# Patient Record
Sex: Female | Born: 1945 | Race: White | Hispanic: No | Marital: Married | Smoking: Former smoker
Health system: Southern US, Community
[De-identification: ages and names within clinical notes are randomized; demographics above are authoritative.]

## PROBLEM LIST (undated history)

## (undated) DIAGNOSIS — G709 Myoneural disorder, unspecified: Secondary | ICD-10-CM

## (undated) DIAGNOSIS — M47816 Spondylosis without myelopathy or radiculopathy, lumbar region: Secondary | ICD-10-CM

## (undated) DIAGNOSIS — F32A Depression, unspecified: Secondary | ICD-10-CM

## (undated) DIAGNOSIS — E785 Hyperlipidemia, unspecified: Secondary | ICD-10-CM

## (undated) DIAGNOSIS — Z87442 Personal history of urinary calculi: Secondary | ICD-10-CM

## (undated) DIAGNOSIS — N289 Disorder of kidney and ureter, unspecified: Secondary | ICD-10-CM

## (undated) DIAGNOSIS — F329 Major depressive disorder, single episode, unspecified: Secondary | ICD-10-CM

## (undated) DIAGNOSIS — E119 Type 2 diabetes mellitus without complications: Secondary | ICD-10-CM

## (undated) DIAGNOSIS — C801 Malignant (primary) neoplasm, unspecified: Secondary | ICD-10-CM

## (undated) DIAGNOSIS — I10 Essential (primary) hypertension: Secondary | ICD-10-CM

## (undated) DIAGNOSIS — M25551 Pain in right hip: Secondary | ICD-10-CM

## (undated) DIAGNOSIS — K760 Fatty (change of) liver, not elsewhere classified: Secondary | ICD-10-CM

## (undated) DIAGNOSIS — G4730 Sleep apnea, unspecified: Secondary | ICD-10-CM

## (undated) DIAGNOSIS — M1810 Unilateral primary osteoarthritis of first carpometacarpal joint, unspecified hand: Secondary | ICD-10-CM

## (undated) DIAGNOSIS — E669 Obesity, unspecified: Secondary | ICD-10-CM

## (undated) DIAGNOSIS — F419 Anxiety disorder, unspecified: Secondary | ICD-10-CM

## (undated) DIAGNOSIS — Q211 Atrial septal defect, unspecified: Secondary | ICD-10-CM

## (undated) DIAGNOSIS — J189 Pneumonia, unspecified organism: Secondary | ICD-10-CM

## (undated) DIAGNOSIS — T7840XA Allergy, unspecified, initial encounter: Secondary | ICD-10-CM

## (undated) DIAGNOSIS — R7303 Prediabetes: Secondary | ICD-10-CM

## (undated) HISTORY — PX: CARPAL TUNNEL RELEASE: SHX101

## (undated) HISTORY — PX: TONSILLECTOMY: SUR1361

## (undated) HISTORY — DX: Myoneural disorder, unspecified: G70.9

## (undated) HISTORY — PX: OTHER SURGICAL HISTORY: SHX169

## (undated) HISTORY — DX: Atrial septal defect: Q21.1

## (undated) HISTORY — PX: CATARACT EXTRACTION: SUR2

## (undated) HISTORY — PX: BREAST REDUCTION SURGERY: SHX8

## (undated) HISTORY — DX: Obesity, unspecified: E66.9

## (undated) HISTORY — DX: Allergy, unspecified, initial encounter: T78.40XA

## (undated) HISTORY — DX: Essential (primary) hypertension: I10

## (undated) HISTORY — DX: Anxiety disorder, unspecified: F41.9

## (undated) HISTORY — DX: Atrial septal defect, unspecified: Q21.10

## (undated) HISTORY — DX: Hyperlipidemia, unspecified: E78.5

## (undated) HISTORY — DX: Prediabetes: R73.03

## (undated) HISTORY — DX: Major depressive disorder, single episode, unspecified: F32.9

## (undated) HISTORY — DX: Depression, unspecified: F32.A

## (undated) HISTORY — DX: Unilateral primary osteoarthritis of first carpometacarpal joint, unspecified hand: M18.10

## (undated) HISTORY — DX: Spondylosis without myelopathy or radiculopathy, lumbar region: M47.816

---

## 1991-05-14 HISTORY — PX: BUNIONECTOMY: SHX129

## 2001-05-13 HISTORY — PX: REDUCTION MAMMAPLASTY: SUR839

## 2004-03-21 ENCOUNTER — Encounter: Payer: Self-pay | Admitting: Internal Medicine

## 2006-05-13 HISTORY — PX: BREAST SURGERY: SHX581

## 2009-10-24 ENCOUNTER — Encounter: Payer: Self-pay | Admitting: Cardiology

## 2010-03-06 ENCOUNTER — Telehealth (INDEPENDENT_AMBULATORY_CARE_PROVIDER_SITE_OTHER): Payer: Self-pay | Admitting: *Deleted

## 2010-04-23 ENCOUNTER — Ambulatory Visit: Payer: Self-pay | Admitting: Internal Medicine

## 2010-04-23 ENCOUNTER — Encounter: Payer: Self-pay | Admitting: Internal Medicine

## 2010-04-23 DIAGNOSIS — Z8679 Personal history of other diseases of the circulatory system: Secondary | ICD-10-CM

## 2010-04-23 DIAGNOSIS — I10 Essential (primary) hypertension: Secondary | ICD-10-CM

## 2010-05-04 ENCOUNTER — Encounter: Payer: Self-pay | Admitting: Internal Medicine

## 2010-05-04 ENCOUNTER — Ambulatory Visit: Payer: Self-pay

## 2010-05-04 ENCOUNTER — Ambulatory Visit (HOSPITAL_COMMUNITY)
Admission: RE | Admit: 2010-05-04 | Discharge: 2010-05-04 | Payer: Self-pay | Source: Home / Self Care | Attending: Internal Medicine | Admitting: Internal Medicine

## 2010-05-21 ENCOUNTER — Telehealth: Payer: Self-pay | Admitting: Internal Medicine

## 2010-05-24 ENCOUNTER — Other Ambulatory Visit: Payer: Self-pay | Admitting: Internal Medicine

## 2010-05-24 ENCOUNTER — Ambulatory Visit
Admission: RE | Admit: 2010-05-24 | Discharge: 2010-05-24 | Payer: Self-pay | Source: Home / Self Care | Attending: Internal Medicine | Admitting: Internal Medicine

## 2010-05-25 LAB — BASIC METABOLIC PANEL
BUN: 17 mg/dL (ref 6–23)
CO2: 28 mEq/L (ref 19–32)
Calcium: 8.9 mg/dL (ref 8.4–10.5)
Chloride: 106 mEq/L (ref 96–112)
Creatinine, Ser: 0.8 mg/dL (ref 0.4–1.2)
GFR: 75.46 mL/min (ref >60.00)
Glucose, Bld: 96 mg/dL (ref 70–99)
Potassium: 4 mEq/L (ref 3.5–5.1)
Sodium: 142 mEq/L (ref 135–145)

## 2010-05-29 ENCOUNTER — Encounter: Payer: Self-pay | Admitting: Internal Medicine

## 2010-06-12 NOTE — Letter (Signed)
Summary: External Correspondence/ [facility]  External Correspondence/ [facility]   Imported By: Dorise Hiss 10/31/2009 11:18:10  _____________________________________________________________________  External Attachment:    Type:   Image     Comment:   External Document

## 2010-06-12 NOTE — Progress Notes (Signed)
  ROI faxed to [facility] 10/21,records received back 10/25, gave to [facility]  March 06, 2010 9:36 AM

## 2010-06-14 LAB — CONVERTED CEMR LAB
BUN: 19 mg/dL (ref 6–23)
CO2: 27 meq/L (ref 19–32)
Calcium: 9.4 mg/dL (ref 8.4–10.5)
Chloride: 105 meq/L (ref 96–112)
Creatinine, Ser: 0.7 mg/dL (ref 0.4–1.2)
GFR calc non Af Amer: 92.37 mL/min (ref >60.00)
Glucose, Bld: 105 mg/dL — ABNORMAL HIGH (ref 70–99)
Potassium: 4.1 meq/L (ref 3.5–5.1)
Sodium: 139 meq/L (ref 135–145)

## 2010-06-14 NOTE — Letter (Addendum)
Summary: [facility]  [facility] 1999-2005   Imported By: Marylou Mccoy 05/10/2010 16:47:37  _____________________________________________________________________  External Attachment:    Type:   Image     Comment:   External Document

## 2010-06-14 NOTE — Assessment & Plan Note (Addendum)
Summary: change in medication HCTZ  Prescriptions: HYDROCHLOROTHIAZIDE 12.5 MG CAPS (HYDROCHLOROTHIAZIDE) one by mouth daily  #30 x 11   Entered by:   Dennis Bast, RN, BSN   Authorized by:   Hillis Range, MD   Signed by:   Dennis Bast, RN, BSN on 05/29/2010   Method used:   Faxed to ...       Hospital doctor (retail)       [address]       [place], [place]  [address]       Ph: [phone] or [phone]       Fax: [phone]   RxID:   [id number] HYDROCHLOROTHIAZIDE 12.5 MG CAPS (HYDROCHLOROTHIAZIDE) one by mouth daily  #30 x 11   Entered by:   Dennis Bast, RN, BSN   Authorized by:   Hillis Range, MD   Signed by:   Dennis Bast, RN, BSN on 05/29/2010   Method used:   Print then Give to Patient   RxID:   [id number]

## 2010-06-14 NOTE — Assessment & Plan Note (Addendum)
Summary: moving back to area/hx of atrial septal defect/saf   Visit Type:  Initial Consult Referring Provider:  Loleta Chance, MD [facility]) Primary Provider:  Cecille Amsterdam (Dr Deitra Mayo' office)   History of Present Illness: Julie Owen is a pleasnat 65 yo WF with a h/o ASD who presents today for cardiology self referral.  She was previously followed by DR McGorisk at [facility].  She states that she was followed with echo's each year and was planned to have ASD repair 2006.  She did not have the procedure due to concerns of her dog being sick and not wanting to stay in the hospital.  She was initiated on ASA but has not tolerated this medicine due to GI intolerance.  She has not had cardiology follow-up since that time. 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  She reports that she will likely need a hip replacement.  She denies exertional chest pain, SOB at rest, orthopnea, PND, edema,  presyncope, or syncope. She reports prior heart "fluttering" but none recently.   She reports stable dyspnea with moderate activity which she attributes to sedentary lifestyle and obesity.  Current Medications (verified): 1)  Simvastatin 40 Mg Tabs (Simvastatin) .... Take One Tablet By Mouth Daily At Bedtime 2)  Cymbalta 30 Mg Cpep (Duloxetine Hcl) .... Two Times A Day 3)  Noritate 1 % Crea (Metronidazole) .... Uad  Allergies (verified): No Known Drug Allergies  Past History:  Past Medical History: ATRIAL SEPTAL DEFECT diagnosed 1980 Obesity R hip DJD HL Depression/ Anxiety Rosecia  denies h/o rheumatic fever    Past Surgical History: tonsellectomy toe surgery breast surgery 2008 to remove "atypical cells" with subsequent breast reduction surgery  Family History: mother died at age 86 of MI, first MI age 76  Social History: Pt lives in [place].  Moved here from [place] last week.  Retired Runner, broadcasting/film/video quit smoking 1980 drinks 1 glass of wine daily  Review of Systems   All systems are reviewed and negative except as listed in the HPI.   + occasional headaches  Vital Signs:  Patient profile:   65 year old female Height:      65 inches Weight:      204 pounds BMI:     34.07 Pulse rate:   82 / minute BP sitting:   148 / 80  Vitals Entered By: Julie Owen CMA (April 23, 2010 2:39 PM)  Physical Exam  General:  overweight, NAD Head:  normocephalic and atraumatic Eyes:  PERRLA/EOM intact; conjunctiva and lids normal. Mouth:  Teeth, gums and palate normal. Oral mucosa normal. Neck:  Neck supple, no JVD. No masses, thyromegaly or abnormal cervical nodes. Lungs:  Clear bilaterally to auscultation and percussion. Heart:  RRR, no m/r/g  loud P2 Abdomen:  Bowel sounds positive; abdomen soft and non-tender without masses, organomegaly, or hernias noted. No hepatosplenomegaly. Msk:  Back normal, normal gait. Muscle strength and tone normal. Extremities:  No clubbing or cyanosis. Neurologic:  Alert and oriented x 3. Skin:  Intact without lesions or rashes. Cervical Nodes:  no significant adenopathy Psych:  Normal affect.   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  There was felt to be enough of a rim below the ASD to allow closure.  TE Echocardiogram  Procedure date:  08/30/2003  Findings:      LVEF 55-60%, LVEDD 43, LA 42, RV pressure 21-26, diastolic dysfunction filling pattern,   Impression & Recommendations:  Problem # 1:  ATRIAL SEPTAL DEFECT, HX OF (ICD-V12.59) The patient presents today for follow-up of her previously documented ASD.  She had R to L shunt by doppler on TEE in 2005.  She was previously offered closure but declined.  Presently, she  appears to be doing well with minimal symptoms. We will repeat her TTE with bubble to evaluate her ASD and then decide whether or not referral to Dr Excell Seltzer for closure is indicated. She has not tolerated ASA previously.  Problem # 2:  ESSENTIAL HYPERTENSION, BENIGN (ICD-401.1) above goal add Lisinopril 5mg  daily today. check BMET  Other Orders: TLB-BMP (Basic Metabolic Panel-BMET) (80048-METABOL) Echocardiogram (Echo)  Patient Instructions: 1)  Your physician recommends that you return for lab work today 2)  Your physician has recommended you make the following change in your medication: start Lisinopril 5mg  daily 3)  Your physician wants you to follow-up in: 12 months with Dr Jacquiline Doe will receive a reminder letter in the mail two months in advance. If you don't receive a letter, please call our office to schedule the follow-up appointment. 4)  Your physician has requested that you have an echocardiogram.  Echocardiography is a painless test that uses sound waves to create images of your heart. It provides your doctor with information about the size and shape of your heart and how well your heart's chambers and valves are working.  This procedure takes approximately one hour. There are no restrictions for this procedure. Prescriptions: LISINOPRIL 5 MG TABS (LISINOPRIL) one by mouth  #30 x 11   Entered by:   Dennis Bast, RN, BSN   Authorized by:   Hillis Range, MD   Signed by:   Dennis Bast, RN, BSN on 04/23/2010   Method used:   Faxed to ...       Hospital doctor (retail)       [address]       [place], [place]  [address]       Ph: [phone] or [phone]       Fax: [phone]   RxID:   [id number]

## 2010-06-14 NOTE — Progress Notes (Addendum)
Summary: pt needs to talk about side effects of medication  Phone Note Call from Patient Call back at Home Phone [phone]   Caller: Patient Reason for Call: Talk to Nurse, Talk to Doctor Summary of Call: pt needs to talk to you about side effects from her b/p meds  Initial call taken by: Omer Jack,  May 21, 2010 11:31 AM  Follow-up for Phone Call        discussed with DrAllred  Pt to d/c Lisinopril and and some by on Thurs for BMP at 3pm  Will keep track of her BP's and bring them in with her when she comes for labs Dennis Bast, RN, BSN  May 21, 2010 3:13 PM

## 2010-06-18 ENCOUNTER — Telehealth: Payer: Self-pay | Admitting: Internal Medicine

## 2010-06-22 ENCOUNTER — Telehealth (INDEPENDENT_AMBULATORY_CARE_PROVIDER_SITE_OTHER): Payer: Self-pay | Admitting: *Deleted

## 2010-06-28 NOTE — Progress Notes (Signed)
Summary: pt has medication question  Phone Note Call from Patient Call back at [phone]   Caller: Patient Reason for Call: Talk to Nurse, Talk to Doctor Summary of Call: pt has a question regarding the b/p medication she was put on called lopressor Initial call taken by: Omer Jack,  June 18, 2010 11:57 AM  Follow-up for Phone Call        Pt. states was take Lisinopril 5 mg once daily. She was having adverse reaction from medication. Lisinopril was D/C and she was placed on HCTZ 12.5 mg once a day on Jan. 17th/2012. Pt. states this medication is not getting her B/P down. Pt's average B/P is 164/112 to 145/96 in 17 days Follow-up by: Ollen Gross, RN, BSN,  June 18, 2010 1:49 PM  Additional Follow-up for Phone Call Additional follow up Details #1::        164/113--highest   avg  145/93 for 17 days What should she do?  I informed her I would discuss with Dr Johney Frame and call her back tomorrow Dennis Bast, RN, BSN  June 19, 2010 5:35 PM put in correct chatLanier, RN, BSN  June 22, 2010 4:10 PM

## 2010-06-29 ENCOUNTER — Other Ambulatory Visit: Payer: Self-pay

## 2010-07-04 NOTE — Progress Notes (Signed)
  Phone Note Call from Patient   Caller: Patient Call For: nurse Summary of Call: Phone Note  Call from Patient Call back at [phone]   Caller: Patient Reason for Call: Talk to Nurse, Talk to Doctor Summary of Call: pt has a question regarding the b/p medication she was put on called lopressor Initial call taken by: Omer Jack,  June 18, 2010 11:57 AM  Follow-up for Phone Call         Pt. states was take Lisinopril 5 mg once daily. She was having adverse reaction from medication. Lisinopril was D/C and she was placed on HCTZ 12.5 mg once a day on Jan. 17th/2012. Pt. states this medication is not getting her B/P down. Pt's average B/P is 164/112 to 145/96 in 17 days Follow-up by: Ollen Gross, RN, BSN,  June 18, 2010 1:49 PM  Additional Follow-up for Phone Call  Additional follow up Details #1::        164/113--highest   avg  145/93 for 17 days What should she do?  I informed her I would discuss with Dr Johney Frame and call her back tomorrow Dennis Bast, RN, BSN  June 19, 2010 5:35 PM   Follow-up for Phone Call        increase hctz to 25mg  daily  Follow-up by: Hillis Range, MD,  June 25, 2010 1:58 PM  Additional Follow-up for Phone Call Additional follow up Details #1::        called pt and spoke with husband.  Call her on her cell # [phone].  Called and spoke with patient informed her to increase HCTZ 25mg  daily Talked with her primary MD and he wanted her to increase her HCTZ 25mg  She did this and says her BP was unchanged  So now she has an appointment  with them tomorrow and she is going to see what they say.   Dennis Bast, RN, BSN  June 26, 2010 5:05 PM

## 2010-08-12 HISTORY — PX: TOTAL HIP ARTHROPLASTY: SHX124

## 2010-08-15 NOTE — H&P (Signed)
NAMEELZABETH, MCQUERRY NO.:  [id number]  MEDICAL RECORD NO.:  [id number]          PATIENT TYPE:  INP  LOCATION:                               FACILITY:  [facility]  PHYSICIAN:  Ollen Gross, M.D.    DATE OF BIRTH:  [birth year]  DATE OF ADMISSION:  08/24/2010 DATE OF DISCHARGE:                             HISTORY & PHYSICAL   CHIEF COMPLAINT:  Right hip pain.  HISTORY OF PRESENT ILLNESS:  The patient is a 65 year old female who has been seen by Dr. Lequita Halt for ongoing right hip pain.  She has known advancing arthritis, which has been progressive in nature.  She is felt she would benefit from undergoing surgical intervention.  Risks and benefits have been discussed.  She elected to proceed with surgery.  ALLERGIES:  Multiple allergies to include, 1. PERCOCET causes hives and spots. 2. CODEINE causes hives. 3. NEOSPORIN topically causes burning and itching and skin     sensitivity. 4. She has a positive skin test to NOVOCAINE, positive skin test to     MARCAINE.  INTOLERANCES:  ASPIRIN causes GI upset, pain, and diarrhea.  CURRENT MEDICATIONS: 1. Bystolic 5 mg every morning. 2. Cymbalta 30 mg twice a day. 3. Simvastatin 20 mg at night. 4. Vitamin C. 5. Aleve.  PAST MEDICAL HISTORY: 1. Impaired vision, she uses glasses. 2. Tinnitus. 3. Anxiety. 4. Depression. 5. Hypertension. 6. Atrial septal defect (no surgery). 7. Hypercholesterolemia. 8. Hemorrhoids. 9. Urinary incontinence. 10.Prediabetic condition. 11.Postmenopausal. 12.Childhood illnesses, measles, and mumps.  PAST SURGICAL HISTORY:  Bunionectomy, toe surgery, right breast surgery, breast reduction.  FAMILY HISTORY:  Father living, age 67, with glaucoma, macular degeneration.  Mother deceased at age 75, hypertension, heart disease with 2 heart attacks, hypercholesterolemia.  Sister with hypertension, colon cancer disease, she is living at age 51.  SOCIAL HISTORY:  Married.  Previous  Runner, broadcasting/film/video.  Past smoker.  Glass of red wine daily. Spouse will be assisting with care after the surgery with the patient.  She does have a caregiver lined up.  She has 3 steps entering her home.  REVIEW OF SYSTEMS:  GENERAL:  No fever, chills, occasional night sweats. NEURO:  A little bit of hearing loss and no seizures or syncope. RESPIRATORY:  No shortness of breath, productive cough, or hemoptysis. CARDIOVASCULAR:  No chest pain, angina, or orthopnea.  GI:  No nausea, vomiting, diarrhea, or constipation.  GU:  A little bit of incontinence, weak stream.  No dysuria, hematuria.  MUSCULOSKELETAL:  Right hip.  PHYSICAL EXAMINATION:  VITAL SIGNS:  Pulse 64, respirations 12, blood pressure 134/72. GENERAL:  A 65 year old white female, well nourished, well developed, in no acute distress.  She is alert, oriented, cooperative, very pleasant. Good historian. HEENT:  Normocephalic, atraumatic.  Pupils are round and reactive.  EOMs intact. NECK:  Supple. CHEST:  Clear. HEART:  Regular rate and rhythm without murmur. ABDOMEN:  Soft, slightly round.  Bowel sounds present. RECTAL/BREASTS/GENITALIA:  Not done, not pertinent to present illness. EXTREMITIES:  Right hip, flexion 90, 0 internal rotation, 20 degrees external rotation, 20 degrees abduction.  IMPRESSION:  Osteoarthritis, right  hip.  PLAN:  The patient admitted to [facility] to undergo a right total hip replacement arthroplasty.  Surgery will be performed by Dr. Ollen Gross.     Alexzandrew L. Julien Girt, P.A.C.   ______________________________ Ollen Gross, M.D.    ALP/MEDQ  D:  08/05/2010  T:  08/06/2010  Job:  [id number]  cc:   Barry Dienes. Eloise Harman, M.D. Fax: [phone]  Hillis Range, MD [address] [place] [address]  Electronically Signed by Patrica Duel P.A.C. on 08/15/2010 07:17:10 AM Electronically Signed by Ollen Gross M.D. on 08/15/2010 09:54:03 AM

## 2010-08-20 ENCOUNTER — Ambulatory Visit (HOSPITAL_COMMUNITY)
Admission: RE | Admit: 2010-08-20 | Discharge: 2010-08-20 | Disposition: A | Discharge status: Home / Self Care | Payer: Medicare Other | Source: Ambulatory Visit | Attending: Orthopedic Surgery | Admitting: Orthopedic Surgery

## 2010-08-20 ENCOUNTER — Encounter (HOSPITAL_COMMUNITY): Payer: Medicare Other

## 2010-08-20 ENCOUNTER — Other Ambulatory Visit (HOSPITAL_COMMUNITY): Payer: Self-pay | Admitting: Orthopedic Surgery

## 2010-08-20 ENCOUNTER — Telehealth: Payer: Self-pay | Admitting: Internal Medicine

## 2010-08-20 DIAGNOSIS — I10 Essential (primary) hypertension: Secondary | ICD-10-CM | POA: Insufficient documentation

## 2010-08-20 DIAGNOSIS — R059 Cough, unspecified: Secondary | ICD-10-CM | POA: Insufficient documentation

## 2010-08-20 DIAGNOSIS — M1610 Unilateral primary osteoarthritis, unspecified hip: Secondary | ICD-10-CM | POA: Insufficient documentation

## 2010-08-20 DIAGNOSIS — Z01812 Encounter for preprocedural laboratory examination: Secondary | ICD-10-CM | POA: Insufficient documentation

## 2010-08-20 DIAGNOSIS — Z01818 Encounter for other preprocedural examination: Secondary | ICD-10-CM

## 2010-08-20 DIAGNOSIS — R05 Cough: Secondary | ICD-10-CM | POA: Insufficient documentation

## 2010-08-20 DIAGNOSIS — M169 Osteoarthritis of hip, unspecified: Secondary | ICD-10-CM | POA: Insufficient documentation

## 2010-08-20 LAB — COMPREHENSIVE METABOLIC PANEL
ALT: 43 U/L — ABNORMAL HIGH (ref 0–35)
AST: 40 U/L — ABNORMAL HIGH (ref 0–37)
CO2: 30 mEq/L (ref 19–32)
Calcium: 9.6 mg/dL (ref 8.4–10.5)
Chloride: 105 mEq/L (ref 96–112)
Creatinine, Ser: 0.78 mg/dL (ref 0.4–1.2)
GFR calc Af Amer: >60 mL/min (ref >60)
GFR calc non Af Amer: >60 mL/min (ref >60)
Glucose, Bld: 126 mg/dL — ABNORMAL HIGH (ref 70–99)
Total Bilirubin: 0.5 mg/dL (ref 0.3–1.2)

## 2010-08-20 LAB — URINE MICROSCOPIC-ADD ON

## 2010-08-20 LAB — CBC
HCT: 45.6 % (ref 36.0–46.0)
Hemoglobin: 14.8 g/dL (ref 12.0–15.0)
RBC: 4.73 MIL/uL (ref 3.87–5.11)
WBC: 7.8 10*3/uL (ref 4.0–10.5)

## 2010-08-20 LAB — PROTIME-INR
INR: 0.92 (ref 0.00–1.49)
Prothrombin Time: 12.6 seconds (ref 11.6–15.2)

## 2010-08-20 LAB — URINALYSIS, ROUTINE W REFLEX MICROSCOPIC
Hgb urine dipstick: NEGATIVE
Nitrite: NEGATIVE
Protein, ur: NEGATIVE mg/dL
Specific Gravity, Urine: 1.024 (ref 1.005–1.030)
Urobilinogen, UA: 0.2 mg/dL (ref 0.0–1.0)

## 2010-08-20 LAB — APTT: aPTT: 28 seconds (ref 24–37)

## 2010-08-20 LAB — TYPE AND SCREEN: ABO/RH(D): A POS

## 2010-08-20 NOTE — Telephone Encounter (Signed)
12,Echo faxed over to [facility] @ [phone] 08/20/10/KM

## 2010-08-21 LAB — ABO/RH: ABO/RH(D): A POS

## 2010-08-24 ENCOUNTER — Inpatient Hospital Stay (HOSPITAL_COMMUNITY): Payer: Medicare Other

## 2010-08-24 ENCOUNTER — Inpatient Hospital Stay (HOSPITAL_COMMUNITY)
Admission: RE | Admit: 2010-08-24 | Discharge: 2010-08-28 | DRG: 470 | Disposition: A | Discharge status: Home Health | Payer: Medicare Other | Source: Ambulatory Visit | Attending: Orthopedic Surgery | Admitting: Orthopedic Surgery

## 2010-08-24 DIAGNOSIS — M1610 Unilateral primary osteoarthritis, unspecified hip: Principal | ICD-10-CM | POA: Diagnosis present

## 2010-08-24 DIAGNOSIS — I10 Essential (primary) hypertension: Secondary | ICD-10-CM | POA: Diagnosis present

## 2010-08-24 DIAGNOSIS — M169 Osteoarthritis of hip, unspecified: Principal | ICD-10-CM | POA: Diagnosis present

## 2010-08-24 DIAGNOSIS — R5082 Postprocedural fever: Secondary | ICD-10-CM | POA: Diagnosis not present

## 2010-08-24 DIAGNOSIS — E7800 Pure hypercholesterolemia, unspecified: Secondary | ICD-10-CM | POA: Diagnosis present

## 2010-08-24 DIAGNOSIS — F341 Dysthymic disorder: Secondary | ICD-10-CM | POA: Diagnosis present

## 2010-08-24 DIAGNOSIS — E669 Obesity, unspecified: Secondary | ICD-10-CM | POA: Diagnosis present

## 2010-08-24 LAB — GLUCOSE, CAPILLARY: Glucose-Capillary: 153 mg/dL — ABNORMAL HIGH (ref 70–99)

## 2010-08-25 ENCOUNTER — Inpatient Hospital Stay (HOSPITAL_COMMUNITY): Payer: Medicare Other

## 2010-08-25 LAB — BASIC METABOLIC PANEL
Chloride: 102 mEq/L (ref 96–112)
GFR calc Af Amer: >60 mL/min (ref >60)
GFR calc non Af Amer: >60 mL/min (ref >60)
Potassium: 4.3 mEq/L (ref 3.5–5.1)
Sodium: 136 mEq/L (ref 135–145)

## 2010-08-25 LAB — URINALYSIS, ROUTINE W REFLEX MICROSCOPIC
Ketones, ur: NEGATIVE mg/dL
Leukocytes, UA: NEGATIVE
Nitrite: NEGATIVE
Protein, ur: NEGATIVE mg/dL
Specific Gravity, Urine: 1.011 (ref 1.005–1.030)
Specific Gravity, Urine: 1.021 (ref 1.005–1.030)
Urobilinogen, UA: 0.2 mg/dL (ref 0.0–1.0)
pH: 5.5 (ref 5.0–8.0)

## 2010-08-25 LAB — CBC
Platelets: 167 10*3/uL (ref 150–400)
RBC: 3.82 MIL/uL — ABNORMAL LOW (ref 3.87–5.11)
RDW: 13.9 % (ref 11.5–15.5)
WBC: 9 10*3/uL (ref 4.0–10.5)

## 2010-08-25 LAB — URINE MICROSCOPIC-ADD ON

## 2010-08-26 LAB — BASIC METABOLIC PANEL
CO2: 30 mEq/L (ref 19–32)
Calcium: 8.3 mg/dL — ABNORMAL LOW (ref 8.4–10.5)
Creatinine, Ser: 0.74 mg/dL (ref 0.4–1.2)
GFR calc Af Amer: >60 mL/min (ref >60)
Glucose, Bld: 158 mg/dL — ABNORMAL HIGH (ref 70–99)

## 2010-08-26 LAB — CBC
Hemoglobin: 11.2 g/dL — ABNORMAL LOW (ref 12.0–15.0)
MCH: 31.2 pg (ref 26.0–34.0)
MCHC: 32.7 g/dL (ref 30.0–36.0)
RDW: 13.5 % (ref 11.5–15.5)

## 2010-08-26 LAB — URINE CULTURE: Special Requests: NEGATIVE

## 2010-08-27 ENCOUNTER — Inpatient Hospital Stay (HOSPITAL_COMMUNITY): Payer: Medicare Other

## 2010-08-27 LAB — CBC
HCT: 37.5 % (ref 36.0–46.0)
Hemoglobin: 12.4 g/dL (ref 12.0–15.0)
WBC: 11.3 10*3/uL — ABNORMAL HIGH (ref 4.0–10.5)

## 2010-08-29 NOTE — Op Note (Signed)
[name], [name] NO.:  Owen  MEDICAL RECORD NO.:  Julie Owen           PATIENT TYPE:  I  LOCATION:  0005                         FACILITY:  [facility]  PHYSICIAN:  Julie Owen, M.D.    DATE OF BIRTH:  1945-07-18  DATE OF PROCEDURE: DATE OF DISCHARGE:                              OPERATIVE REPORT   PREOPERATIVE DIAGNOSIS:  Osteoarthritis, right hip.  POSTOPERATIVE DIAGNOSIS:  Osteoarthritis, right hip.  PROCEDURE:  Right total hip arthroplasty.  SURGEON:  Julie Owen, M.D.  ASSISTANT:  Alexzandrew L. Perkins, P.A.C.  ANESTHESIA:  General.  ESTIMATED BLOOD LOSS:  300.  DRAIN:  Hemovac x1.  COMPLICATIONS:  None.  CONDITION:  Stable to recovery room.  CLINICAL NOTE:  Julie Owen is a 65 year old female with advanced end- stage arthritis of the right hip with progressively worsening pain and dysfunction.  She has failed nonoperative management and presents for right total hip arthroplasty.PROCEDURE IN DETAIL:  After successful administration of general anesthetic, the patient was placed in left lateral decubitus position with the right side up and held with the hip positioner.  Right lower extremity was isolated from perineum with plastic drapes and prepped and draped in usual sterile fashion.  Short posterolateral incision was made with a #10 blade through subcutaneous tissue to the level of fascia lata which was incised in line with the skin incision.  Sciatic nerve was palpated and protected and short rotators and capsule isolated off the femur.  Hip was dislocated and the center of femoral head was marked.  A trial prosthesis was placed such that the center of the trial head corresponded to the center of the native femoral head.  Osteotomy line was marked on the femoral neck and osteotomy made with an oscillating saw.  Femoral head was removed and then retractors were placed to gain access to the proximal femur.  The canal was entered  with a canal finder.  We then thoroughly irrigated with saline to remove the fatty contents of the femoral canal.  Axial reaming was performed to 13.5 mm, proximal reaming to a 94F and the sleeve machined to an extra extra large.  An 63 F extra extra large trial sleeve was then placed.  The femur was retracted anteriorly to gain acetabular exposure. Acetabular retractors were placed and labrum and osteophytes removed. Acetabular reaming was performed up to 53 mm and a 54-mm pinnacle acetabular shell was placed in anatomic position and transfixed with 2 dome screws.  Apex hole eliminator was placed and then the 32 mm neutral +4 marathon liner was placed.  We then placed the trial femur which was 18 x 13, 36 +8 matching native anteversion.  32 +0 head was placed and it reduced easily, so went to +3 which had more appropriate soft tissue tension.  She has great stability with full extension, full external rotation, 70 degrees flexion, 40 degrees adduction, 90 degrees internal rotation, 90 degrees of flexion, and 70 degrees of internal rotation. By placing the right leg on top of the left, it felt as though the leg lengths were equal.  It was then dislocated  and trials removed.  The permanent 29 F extra extra large sleeve was placed with an 18 x 13 stem and a 36 +8 neck matching native anteversion.  The 32 +3 ceramic head was placed and the hip was reduced with the same stability parameters. The wound was copiously irrigated with saline solution and the short rotators and capsule reattached to the femur through drill holes with Ethibond suture.  Fascia lata was closed over Hemovac drain with interrupted #1 Vicryl, subcu closed with #1 and #2-0 Vicryl and subcuticular running 4-0 Monocryl.  Drain was hooked to suction. Incision cleaned and dried, and Steri-Strips and a bulky sterile dressing were applied.  She was then placed into a knee immobilizer, awakened and transported to recovery in  stable condition.     Julie Owen, M.D.     FA/MEDQ  D:  08/24/2010  T:  08/24/2010  Job:  [id number]  Electronically Signed by Julie Owen M.D. on 08/29/2010 09:53:07 AM

## 2011-06-25 ENCOUNTER — Encounter: Payer: Self-pay | Admitting: *Deleted

## 2011-06-25 ENCOUNTER — Encounter: Payer: Medicare Other | Attending: Internal Medicine | Admitting: *Deleted

## 2011-06-25 DIAGNOSIS — R7309 Other abnormal glucose: Secondary | ICD-10-CM | POA: Insufficient documentation

## 2011-06-25 DIAGNOSIS — E669 Obesity, unspecified: Secondary | ICD-10-CM | POA: Insufficient documentation

## 2011-06-25 DIAGNOSIS — Z713 Dietary counseling and surveillance: Secondary | ICD-10-CM | POA: Insufficient documentation

## 2011-06-25 NOTE — Patient Instructions (Addendum)
Goals:  Follow Pre-Diabetes Meal Plan as instructed (yellow card).  Eat 3 meals and 1-2 snacks, AVOID meal skipping!!  Add lean protein foods to ALL meals/snacks.  Aim for 20-30 mins of physical activity 2-3 days a week.  Gradually increase as able.   Follow up with insurance about Silver Sneakers program card and diabetes class coverage.   Limit sweets and high fat/fried foods.  **BILL, do not bring any more sweets (especially chocolate covered doughnuts) into the house until further notice!! :)

## 2011-06-25 NOTE — Progress Notes (Signed)
Medical Nutrition Therapy:  Appt start time: 1130 end time:  1230.  Assessment:  Hyperglycemia.  Pt with A1c of 6.4% here for assessment.  Reports recent FBG of 130 mg and 80 mg 1 hr after breakfast.  Consumes excessive CHO intake via simple, refined sugars and states sugar is a "big problem" at night. Spouse often brings high CHO foods (doughnuts, etc) into the house that she states she cannot resist.  Pt is exercising 3x/wk and reports no pain at this time.  MEDICATIONS: See medication list. Reviewed.   DIETARY INTAKE:  Usual eating pattern includes 2-3 meals and 1-2 snacks per day.  24-hr recall:  B ( AM): None Snk (11 AM): bowl of cereal, 1/2 banana, milk; orange Boiled egg and Atkins Shake  L ( PM): None Snk (2 PM): Avocado sandwich D (7 PM): Baked chicken thigh, salad w/ non-starchy veggies Snk ( PM): doughnut, lg piece of cake Beverages: Water  Usual physical activity: None  Estimated energy needs: 1300 calories 145-150 g carbohydrates 95-100 g protein 35-40 g fat  Progress Towards Goal(s):  In progress.   Nutritional Diagnosis:  [place]-2.2 Altered nutrition-related laboratory related to glucose metabolism as evidenced by recent A1c of 6.4% and MD referral for assessment.    Intervention/Goals:  Follow Pre-Diabetes Meal Plan as instructed (yellow card).  Eat 3 meals and 1-2 snacks, AVOID meal skipping!!  Add lean protein foods to ALL meals/snacks.  Aim for 20-30 mins of physical activity 2-3 days a week. Gradually increase as able.   Follow up with insurance about Silver Sneakers program card and diabetes class coverage.   Limit sweets and high fat/fried foods.  Handouts given during visit include:  Living Well with Diabetes - [employer]  Snack List/30 g & 45 g CHO menus  Monitoring/Evaluation:  Dietary intake, exercise, A1c and BG as available, and body weight in 4 week(s).

## 2011-06-26 ENCOUNTER — Encounter: Payer: Self-pay | Admitting: *Deleted

## 2011-08-22 ENCOUNTER — Other Ambulatory Visit (HOSPITAL_COMMUNITY): Payer: Self-pay | Admitting: Orthopedic Surgery

## 2011-08-22 DIAGNOSIS — M25551 Pain in right hip: Secondary | ICD-10-CM

## 2011-08-30 ENCOUNTER — Other Ambulatory Visit (HOSPITAL_COMMUNITY): Payer: Medicare Other

## 2011-08-30 ENCOUNTER — Encounter (HOSPITAL_COMMUNITY): Payer: Medicare Other

## 2011-09-02 ENCOUNTER — Encounter (HOSPITAL_COMMUNITY)
Admission: RE | Admit: 2011-09-02 | Discharge: 2011-09-02 | Disposition: A | Discharge status: Home / Self Care | Payer: Medicare Other | Source: Ambulatory Visit | Attending: Orthopedic Surgery | Admitting: Orthopedic Surgery

## 2011-09-02 ENCOUNTER — Encounter (HOSPITAL_COMMUNITY): Payer: Self-pay

## 2011-09-02 DIAGNOSIS — M25559 Pain in unspecified hip: Secondary | ICD-10-CM | POA: Insufficient documentation

## 2011-09-02 DIAGNOSIS — M25551 Pain in right hip: Secondary | ICD-10-CM

## 2011-09-02 DIAGNOSIS — Z96649 Presence of unspecified artificial hip joint: Secondary | ICD-10-CM | POA: Insufficient documentation

## 2011-09-02 HISTORY — DX: Pain in right hip: M25.551

## 2011-09-02 MED ORDER — TECHNETIUM TC 99M MEDRONATE IV KIT
23.8000 | PACK | Freq: Once | INTRAVENOUS | Status: AC | PRN
  Administered 2011-09-02: 23.8 via INTRAVENOUS

## 2011-10-28 ENCOUNTER — Emergency Department (HOSPITAL_COMMUNITY)
Admission: EM | Admit: 2011-10-28 | Discharge: 2011-10-28 | Disposition: A | Discharge status: Home / Self Care | Payer: Medicare Other | Source: Home / Self Care

## 2011-10-28 ENCOUNTER — Encounter (HOSPITAL_COMMUNITY): Payer: Self-pay

## 2011-10-28 DIAGNOSIS — N390 Urinary tract infection, site not specified: Secondary | ICD-10-CM

## 2011-10-28 LAB — POCT URINALYSIS DIP (DEVICE)
Bilirubin Urine: NEGATIVE
Glucose, UA: NEGATIVE mg/dL
Ketones, ur: NEGATIVE mg/dL
Specific Gravity, Urine: 1.025 (ref 1.005–1.030)

## 2011-10-28 MED ORDER — CEPHALEXIN 500 MG PO CAPS: 500.0000 mg | ORAL_CAPSULE | Freq: Three times a day (TID) | ORAL | Status: AC

## 2011-10-28 NOTE — ED Notes (Signed)
Has reportedly been treated by another [facility] in town x 2 for a UTI in past 6 months; treated w Cipro  1 st occurrence, and w macrodantin 2nd time w no relief. Had been told she needed to see her GYN, but was referred back to the [facility] since they started her tx, has changed insurance, and the new one is not accepted by the other [facility]. C?o frequency, urgency, sense of incomplete emptying, burning w urination

## 2011-10-28 NOTE — Discharge Instructions (Signed)
Thank you for coming in today.  I think it is likely you have a UTI.  We will do a urine culture to be sure we pick the right antibotic.  Take the keflex three times a day for 10 day.  You should start to feel better soon.   Urinary Tract Infection Infections of the urinary tract can start in several places. A bladder infection (cystitis), a kidney infection (pyelonephritis), and a prostate infection (prostatitis) are different types of urinary tract infections (UTIs). They usually get better if treated with medicines (antibiotics) that kill germs. Take all the medicine until it is gone. You or your child may feel better in a few days, but TAKE ALL MEDICINE or the infection may not respond and may become more difficult to treat. HOME CARE INSTRUCTIONS   Drink enough water and fluids to keep the urine clear or pale yellow. Cranberry juice is especially recommended, in addition to large amounts of water.   Avoid caffeine, tea, and carbonated beverages. They tend to irritate the bladder.   Alcohol may irritate the prostate.   Only take over-the-counter or prescription medicines for pain, discomfort, or fever as directed by your caregiver.  To prevent further infections:  Empty the bladder often. Avoid holding urine for long periods of time.   After a bowel movement, women should cleanse from front to back. Use each tissue only once.   Empty the bladder before and after sexual intercourse.  FINDING OUT THE RESULTS OF YOUR TEST Not all test results are available during your visit. If your or your child's test results are not back during the visit, make an appointment with your caregiver to find out the results. Do not assume everything is normal if you have not heard from your caregiver or the medical facility. It is important for you to follow up on all test results. SEEK MEDICAL CARE IF:   There is back pain.   Your baby is older than 3 months with a rectal temperature of 100.5 F (38.1  C) or higher for more than 1 day.   Your or your child's problems (symptoms) are no better in 3 days. Return sooner if you or your child is getting worse.  SEEK IMMEDIATE MEDICAL CARE IF:   There is severe back pain or lower abdominal pain.   You or your child develops chills.   You have a fever.   Your baby is older than 3 months with a rectal temperature of 102 F (38.9 C) or higher.   Your baby is 3 months old or younger with a rectal temperature of 100.4 F (38 C) or higher.   There is nausea or vomiting.   There is continued burning or discomfort with urination.  MAKE SURE YOU:   Understand these instructions.   Will watch your condition.   Will get help right away if you are not doing well or get worse.  Document Released: 02/06/2005 Document Revised: 04/18/2011 Document Reviewed: 09/11/2006 [facility] Patient Information 2012 [place], [place].

## 2011-10-28 NOTE — ED Provider Notes (Signed)
Medical screening examination/treatment/procedure(s) were performed by a resident physician and as supervising physician I was immediately available for consultation/collaboration.  Leslee Home, M.D.   Reuben Likes, MD 10/28/11 2102

## 2011-10-28 NOTE — ED Provider Notes (Signed)
Julie Owen is a 66 y.o. female who presents to Urgent Care today for her. On May 8 patient was diagnosed with a urinary tract infection and empirically treated with Macrobid in urgent care office. She never really improved and presents to clinic today for further evaluation.  She notes urinary urgency frequency low back pain and pelvic discomfort.  She denies any vaginal discharge. Additionally she denies any fevers chills nausea vomiting or abdominal pain.  Of note she is being currently managed by her gynecologist for a rash in her genitals with corticosteroids and antifungals   PMH reviewed. Hypertension and diabetes History  Substance Use Topics  . Smoking status: Former Smoker    Types: Cigarettes  . Smokeless tobacco: Not on file  . Alcohol Use: No     2-3x/week   ROS as above Medications reviewed. No current facility-administered medications for this encounter.   Current Outpatient Prescriptions  Medication Sig Dispense Refill  . Calcium-Magnesium 500-250 MG TABS Take 1 tablet by mouth 2 (two) times daily.      . cephALEXin (KEFLEX) 500 MG capsule Take 1 capsule (500 mg total) by mouth 3 (three) times daily.  30 capsule  0  . Cholecalciferol (VITAMIN D3) 2000 UNITS TABS Take 1 tablet by mouth daily.      Marland Kitchen CINNAMON PO Take 300 mg by mouth daily.      . metFORMIN (GLUCOPHAGE-XR) 500 MG 24 hr tablet Take 500 mg by mouth daily with breakfast.      . metoprolol tartrate (LOPRESSOR) 25 MG tablet Take 25 mg by mouth 2 (two) times daily.      . vitamin C (ASCORBIC ACID) 500 MG tablet Take 500 mg by mouth daily.        Exam:  BP 144/70  Pulse 68  Temp 98.8 F (37.1 C) (Oral)  Resp 18  SpO2 97% Gen: Well NAD HEENT: EOMI,  MMM Lungs: CTABL Nl WOB Heart: RRR no MRG Abd: NABS, NT, ND Exts: Non edematous BL  LE, warm and well perfused.   Results for orders placed during the hospital encounter of 10/28/11 (from the past 24 hour(s))  POCT URINALYSIS DIP (DEVICE)     Status:  Abnormal   Collection Time   10/28/11  5:41 PM      Component Value Range   Glucose, UA NEGATIVE  NEGATIVE mg/dL   Bilirubin Urine NEGATIVE  NEGATIVE   Ketones, ur NEGATIVE  NEGATIVE mg/dL   Specific Gravity, Urine 1.025  1.005 - 1.030   Hgb urine dipstick SMALL (*) NEGATIVE   pH 5.5  5.0 - 8.0   Protein, ur NEGATIVE  NEGATIVE mg/dL   Urobilinogen, UA 0.2  0.0 - 1.0 mg/dL   Nitrite NEGATIVE  NEGATIVE   Leukocytes, UA SMALL (*) NEGATIVE   No results found.  Assessment and Plan: 66 y.o. female with presumed urinary tract infection.  This patient had failed treatment with Macrobid will treat empirically with Keflex 3 times a day and obtain a urine culture.   Recommend patient followup with her primary care doctor or her gynecologist.    Discussed warning signs or symptoms. Please see discharge instructions. Patient expresses understanding.      Rodolph Bong, MD 10/28/11 1750

## 2011-10-30 LAB — URINE CULTURE
Colony Count: >=100000
Culture  Setup Time: 201306180130

## 2011-10-30 NOTE — ED Notes (Signed)
Urine culture: >100,000 colonies E. Coli.  Pt. adequately treated with Keflex. Julie Owen 10/30/2011

## 2012-01-10 ENCOUNTER — Ambulatory Visit (INDEPENDENT_AMBULATORY_CARE_PROVIDER_SITE_OTHER): Payer: Medicare Other | Admitting: Internal Medicine

## 2012-01-10 VITALS — BP 122/64 | HR 54 | Resp 18 | Ht 65.0 in | Wt 191.5 lb

## 2012-01-10 DIAGNOSIS — I10 Essential (primary) hypertension: Secondary | ICD-10-CM

## 2012-01-10 DIAGNOSIS — E119 Type 2 diabetes mellitus without complications: Secondary | ICD-10-CM | POA: Insufficient documentation

## 2012-01-10 DIAGNOSIS — Z8679 Personal history of other diseases of the circulatory system: Secondary | ICD-10-CM

## 2012-01-10 LAB — BASIC METABOLIC PANEL
CO2: 27 mEq/L (ref 19–32)
Chloride: 102 mEq/L (ref 96–112)
Sodium: 138 mEq/L (ref 135–145)

## 2012-01-10 LAB — HEMOGLOBIN A1C: Hgb A1c MFr Bld: 5.8 % (ref 4.6–6.5)

## 2012-01-10 NOTE — Patient Instructions (Signed)
Your physician wants you to follow-up in: 12 months with Dr Jacquiline Doe will receive a reminder letter in the mail two months in advance. If you don't receive a letter, please call our office to schedule the follow-up appointment.   Your physician has requested that you have an echocardiogram. Echocardiography is a painless test that uses sound waves to create images of your heart. It provides your doctor with information about the size and shape of your heart and how well your heart's chambers and valves are working. This procedure takes approximately one hour. There are no restrictions for this procedure. WITH BUBBLE

## 2012-01-10 NOTE — Assessment & Plan Note (Signed)
Echo 2011 was not very impressive Asymptomatic Will repeat echo with bubble study at this time

## 2012-01-10 NOTE — Assessment & Plan Note (Signed)
Did not tolerate lisinopril due to feeling "jittery" Per her request, we will check an A1C today  Fasting lipids and BMET also   Follow-up with PCP I will see again in a year

## 2012-01-10 NOTE — Assessment & Plan Note (Signed)
Stable No change required today  

## 2012-01-10 NOTE — Progress Notes (Signed)
PCP: Garlan Fillers, MD  Julie Owen is a 66 y.o. female who presents today for routine electrophysiology followup.  Since last being seen in our clinic, the patient reports doing very well. Her primary concern is with R hip pain.   Today, she denies symptoms of palpitations, chest pain, shortness of breath,  lower extremity edema, dizziness, presyncope, or syncope.  The patient is otherwise without complaint today.   Past Medical History  Diagnosis Date  . Hyperlipidemia   . Prediabetes   . Hypertension   . Obesity   . Osteoarthritis of lumbar spine   . Osteoarthritis of thumb     bilateral  . Atrial septal defect   . Hip pain, right    Past Surgical History  Procedure Date  . Total hip arthroplasty 08/2010    Right hip  . Breast surgery 2008    Right breast  . Bunionectomy 1993    Right foot    Current Outpatient Prescriptions  Medication Sig Dispense Refill  . Calcium-Magnesium 500-250 MG TABS Take 1 tablet by mouth 2 (two) times daily.      . Cholecalciferol (VITAMIN D3) 2000 UNITS TABS Take 1 tablet by mouth daily.      Marland Kitchen CINNAMON PO Take 300 mg by mouth daily.      . metFORMIN (GLUCOPHAGE-XR) 500 MG 24 hr tablet Take 500 mg by mouth daily with breakfast.      . metoprolol tartrate (LOPRESSOR) 25 MG tablet Take 25 mg by mouth 2 (two) times daily.      . NORITATE 1 % cream Apply topically daily.       Marland Kitchen terconazole (TERAZOL 7) 0.4 % vaginal cream Place 1 applicator vaginally at bedtime.       . vitamin C (ASCORBIC ACID) 500 MG tablet Take 500 mg by mouth daily.        Physical Exam: Filed Vitals:   01/10/12 1049  BP: 122/64  Pulse: 54  Resp: 18  Height: 5\' 5"  (1.651 m)  Weight: 191 lb 8 oz (86.864 kg)  SpO2: 97%    GEN- The patient is well appearing, alert and oriented x 3 today.   Head- normocephalic, atraumatic Eyes-  Sclera clear, conjunctiva pink Ears- hearing intact Oropharynx- clear Lungs- Clear to ausculation bilaterally, normal work of  breathing Heart- Regular rate and rhythm, no murmurs, rubs or gallops, PMI not laterally displaced GI- soft, NT, ND, + BS Extremities- no clubbing, cyanosis, or edema  ekg today reveals sinus rhythm 59 BPM, PR 192, QRS 78, poor R wave progression Echo 2011 reviewed with patient today  Assessment and Plan:

## 2012-01-14 ENCOUNTER — Other Ambulatory Visit: Payer: Self-pay | Admitting: *Deleted

## 2012-01-15 MED ORDER — PRAVASTATIN SODIUM 40 MG PO TABS: 40.0000 mg | ORAL_TABLET | Freq: Every evening | ORAL | Status: DC

## 2012-01-22 ENCOUNTER — Ambulatory Visit (HOSPITAL_COMMUNITY): Payer: Medicare Other | Attending: Internal Medicine | Admitting: Radiology

## 2012-01-22 ENCOUNTER — Encounter (HOSPITAL_COMMUNITY): Payer: Self-pay | Admitting: *Deleted

## 2012-01-22 DIAGNOSIS — Q211 Atrial septal defect: Secondary | ICD-10-CM

## 2012-01-22 DIAGNOSIS — Z8679 Personal history of other diseases of the circulatory system: Secondary | ICD-10-CM

## 2012-01-22 DIAGNOSIS — Q2111 Secundum atrial septal defect: Secondary | ICD-10-CM | POA: Insufficient documentation

## 2012-01-22 DIAGNOSIS — E119 Type 2 diabetes mellitus without complications: Secondary | ICD-10-CM | POA: Insufficient documentation

## 2012-01-22 DIAGNOSIS — I10 Essential (primary) hypertension: Secondary | ICD-10-CM | POA: Insufficient documentation

## 2012-01-22 NOTE — Progress Notes (Signed)
Patient ID: Julie Owen, female   DOB: [birth year], 66 y.o.   MRN: [id number] 22 G IV started via Right hand x 1 for bubble study. Site unremarkable. Bonnita Levan, RN

## 2012-01-22 NOTE — Progress Notes (Signed)
Echocardiogram with bubble study performed.  

## 2012-01-23 ENCOUNTER — Telehealth: Payer: Self-pay | Admitting: *Deleted

## 2012-01-23 NOTE — Telephone Encounter (Signed)
Spoke with pt regarding echo results. She states she has been told since 1980 that she has an ASD and now this echo does not show it. ? What has happened to it. Reviewed echo results in detail with the pt. She would like to know what dr allred thinks. Will forward for his review.

## 2012-01-26 NOTE — Telephone Encounter (Signed)
Our previous echo and this one both are not suggestive of significant ASD. I do not have her actual echos from before she moved to [place] [facility] to review but would be happy to review them if she has access to them.  As her two echos reveal no ASD, I would not recommend further evaluation at this point.

## 2012-01-30 NOTE — Telephone Encounter (Signed)
Attempted to phone pt about echo results and LM for her to phone me back

## 2012-02-04 ENCOUNTER — Telehealth: Payer: Self-pay | Admitting: Cardiology

## 2012-02-04 NOTE — Telephone Encounter (Signed)
Left message for pt to call.

## 2012-02-04 NOTE — Telephone Encounter (Signed)
Spoke with pt, aware of dr allred's response.

## 2012-02-04 NOTE — Telephone Encounter (Signed)
New Problem: ° ° ° °Patient returned your call.  Please call back. °

## 2012-02-04 NOTE — Telephone Encounter (Signed)
Spoke with pt, echo discussed with pt. She is concerned that the ASD is not on echo's. She is going to contact her other cardiologist that told her she had the ASD and get the records sent to [place].

## 2012-03-11 ENCOUNTER — Other Ambulatory Visit: Payer: Medicare Other

## 2012-03-31 ENCOUNTER — Other Ambulatory Visit: Payer: Self-pay | Admitting: Internal Medicine

## 2012-03-31 DIAGNOSIS — R921 Mammographic calcification found on diagnostic imaging of breast: Secondary | ICD-10-CM

## 2012-04-09 ENCOUNTER — Emergency Department (HOSPITAL_BASED_OUTPATIENT_CLINIC_OR_DEPARTMENT_OTHER)
Admission: EM | Admit: 2012-04-09 | Discharge: 2012-04-10 | Disposition: A | Discharge status: Home / Self Care | Payer: Medicare Other | Attending: Emergency Medicine | Admitting: Emergency Medicine

## 2012-04-09 ENCOUNTER — Emergency Department (HOSPITAL_BASED_OUTPATIENT_CLINIC_OR_DEPARTMENT_OTHER): Payer: Medicare Other

## 2012-04-09 ENCOUNTER — Encounter (HOSPITAL_BASED_OUTPATIENT_CLINIC_OR_DEPARTMENT_OTHER): Payer: Self-pay | Admitting: *Deleted

## 2012-04-09 DIAGNOSIS — Z87891 Personal history of nicotine dependence: Secondary | ICD-10-CM | POA: Insufficient documentation

## 2012-04-09 DIAGNOSIS — N201 Calculus of ureter: Secondary | ICD-10-CM

## 2012-04-09 DIAGNOSIS — N23 Unspecified renal colic: Secondary | ICD-10-CM

## 2012-04-09 DIAGNOSIS — E119 Type 2 diabetes mellitus without complications: Secondary | ICD-10-CM | POA: Insufficient documentation

## 2012-04-09 DIAGNOSIS — Z8679 Personal history of other diseases of the circulatory system: Secondary | ICD-10-CM | POA: Insufficient documentation

## 2012-04-09 DIAGNOSIS — N390 Urinary tract infection, site not specified: Secondary | ICD-10-CM

## 2012-04-09 DIAGNOSIS — R109 Unspecified abdominal pain: Secondary | ICD-10-CM | POA: Insufficient documentation

## 2012-04-09 DIAGNOSIS — I10 Essential (primary) hypertension: Secondary | ICD-10-CM | POA: Insufficient documentation

## 2012-04-09 DIAGNOSIS — E785 Hyperlipidemia, unspecified: Secondary | ICD-10-CM | POA: Insufficient documentation

## 2012-04-09 DIAGNOSIS — E669 Obesity, unspecified: Secondary | ICD-10-CM | POA: Insufficient documentation

## 2012-04-09 DIAGNOSIS — Z8739 Personal history of other diseases of the musculoskeletal system and connective tissue: Secondary | ICD-10-CM | POA: Insufficient documentation

## 2012-04-09 DIAGNOSIS — Z79899 Other long term (current) drug therapy: Secondary | ICD-10-CM | POA: Insufficient documentation

## 2012-04-09 HISTORY — DX: Type 2 diabetes mellitus without complications: E11.9

## 2012-04-09 LAB — COMPREHENSIVE METABOLIC PANEL
ALT: 36 U/L — ABNORMAL HIGH (ref 0–35)
AST: 37 U/L (ref 0–37)
Alkaline Phosphatase: 65 U/L (ref 39–117)
CO2: 28 mEq/L (ref 19–32)
GFR calc Af Amer: 59 mL/min — ABNORMAL LOW (ref >90)
GFR calc non Af Amer: 51 mL/min — ABNORMAL LOW (ref >90)
Glucose, Bld: 137 mg/dL — ABNORMAL HIGH (ref 70–99)
Potassium: 5 mEq/L (ref 3.5–5.1)
Sodium: 142 mEq/L (ref 135–145)
Total Protein: 6.9 g/dL (ref 6.0–8.3)

## 2012-04-09 LAB — CBC WITH DIFFERENTIAL/PLATELET
Eosinophils Absolute: 0.2 10*3/uL (ref 0.0–0.7)
Eosinophils Relative: 2 % (ref 0–5)
HCT: 43.5 % (ref 36.0–46.0)
Hemoglobin: 15 g/dL (ref 12.0–15.0)
Lymphocytes Relative: 29 % (ref 12–46)
Lymphs Abs: 2.4 10*3/uL (ref 0.7–4.0)
MCH: 30.9 pg (ref 26.0–34.0)
MCV: 89.7 fL (ref 78.0–100.0)
Monocytes Absolute: 0.7 10*3/uL (ref 0.1–1.0)
Monocytes Relative: 8 % (ref 3–12)
Platelets: 235 10*3/uL (ref 150–400)
RBC: 4.85 MIL/uL (ref 3.87–5.11)

## 2012-04-09 LAB — URINE MICROSCOPIC-ADD ON

## 2012-04-09 LAB — URINALYSIS, ROUTINE W REFLEX MICROSCOPIC
Bilirubin Urine: NEGATIVE
Glucose, UA: NEGATIVE mg/dL
Specific Gravity, Urine: 1.024 (ref 1.005–1.030)
Urobilinogen, UA: 0.2 mg/dL (ref 0.0–1.0)

## 2012-04-09 MED ORDER — SODIUM CHLORIDE 0.9 % IV SOLN
1000.0000 mL | Freq: Once | INTRAVENOUS | Status: AC
  Administered 2012-04-09: 1000 mL via INTRAVENOUS

## 2012-04-09 MED ORDER — HYDROMORPHONE HCL PF 1 MG/ML IJ SOLN
1.0000 mg | Freq: Once | INTRAMUSCULAR | Status: AC
  Administered 2012-04-09: 1 mg via INTRAVENOUS
  Filled 2012-04-09: qty 1

## 2012-04-09 MED ORDER — TRAMADOL HCL 50 MG PO TABS
50.0000 mg | ORAL_TABLET | Freq: Once | ORAL | Status: AC
  Administered 2012-04-09: 50 mg via ORAL
  Filled 2012-04-09: qty 1

## 2012-04-09 MED ORDER — TAMSULOSIN HCL 0.4 MG PO CAPS
0.4000 mg | ORAL_CAPSULE | Freq: Once | ORAL | Status: AC
  Administered 2012-04-09: 0.4 mg via ORAL
  Filled 2012-04-09: qty 1

## 2012-04-09 MED ORDER — IOHEXOL 300 MG/ML  SOLN: 50.0000 mL | Freq: Once | INTRAMUSCULAR | Status: AC | PRN

## 2012-04-09 MED ORDER — DEXTROSE 5 % IV SOLN
1.0000 g | INTRAVENOUS | Status: DC
  Administered 2012-04-09: 1 g via INTRAVENOUS
  Filled 2012-04-09: qty 10

## 2012-04-09 MED ORDER — SODIUM CHLORIDE 0.9 % IV SOLN
1000.0000 mL | INTRAVENOUS | Status: DC
  Administered 2012-04-09 (×3): 1000 mL via INTRAVENOUS

## 2012-04-09 MED ORDER — ONDANSETRON HCL 4 MG/2ML IJ SOLN
4.0000 mg | Freq: Once | INTRAMUSCULAR | Status: AC
  Administered 2012-04-09: 4 mg via INTRAVENOUS
  Filled 2012-04-09: qty 2

## 2012-04-09 NOTE — ED Notes (Signed)
Patient transported to CT 

## 2012-04-09 NOTE — ED Notes (Signed)
Pt reports she woke low back and low abd pain this morning- had small BM- took ibuprofen without much relief- reports urine flow "trickles"- pain mostly in left lower back and left groin at this time

## 2012-04-09 NOTE — ED Notes (Signed)
Patient ambulates to the restroom without difficulty.

## 2012-04-09 NOTE — ED Provider Notes (Signed)
History     CSN: [id number] Arrival date & time 04/09/12  1530 First MD Initiated Contact with Patient 04/09/12 1602      Chief Complaint  Patient presents with  . Back Pain  . Abdominal Pain   HPI She first noticed some pain in her back a couple of days ago.  She would take ibuprofen intermittently.  This am at around 5 am it became very severe and it was also in her lower abdomen.  She was able to eat somewhat today when it became more severe again.  She has had some nausea and vomiting since.  She denies urgency.  No hematuria. Denies constipation.  BM this morning.  The pain is sharp, severe and knife like in the left back.  The pain does not really radiate to her groin but she does have discomfort in the left side of the abdomen.  No change in pain with movement or activity.  No prior history of same.    Past Medical History  Diagnosis Date  . Hyperlipidemia   . Prediabetes   . Hypertension   . Obesity   . Osteoarthritis of lumbar spine   . Osteoarthritis of thumb     bilateral  . Atrial septal defect   . Hip pain, right   . Diabetes mellitus without complication     Past Surgical History  Procedure Date  . Total hip arthroplasty 08/2010    Right hip  . Breast surgery 2008    Right breast  . Bunionectomy 1993    Right foot  . Breast reduction surgery   . Tonsillectomy     Family History  Problem Relation Age of Onset  . Hypertension Other   . Kidney disease Other   . Stroke Other   . Heart attack Other   . Cancer Other   . Obesity Other   . Hyperlipidemia Mother   . Hypertension Mother   . Heart disease Mother   . Macular degeneration Father   . Cancer Sister     colon  . Hypertension Sister     History  Substance Use Topics  . Smoking status: Former Smoker    Types: Cigarettes  . Smokeless tobacco: Never Used  . Alcohol Use: 1.8 oz/week    3 Glasses of wine per week    OB History    Grav Para Term Preterm Abortions TAB SAB Ect Mult Living                  Review of Systems  All other systems reviewed and are negative.    Allergies  Aspirin; Benzocaine; Caffeine; Codeine; Percocet; Procaine hcl; Pseudoephedrine hcl er; and Simvastatin  Home Medications   Current Outpatient Rx  Name  Route  Sig  Dispense  Refill  . CALCIUM-MAGNESIUM 500-250 MG PO TABS   Oral   Take 1 tablet by mouth 2 (two) times daily.         Marland Kitchen VITAMIN D3 2000 UNITS PO TABS   Oral   Take 1 tablet by mouth daily.         Marland Kitchen CINNAMON PO   Oral   Take 300 mg by mouth daily.         . IBUPROFEN 200 MG PO TABS   Oral   Take 400 mg by mouth every 6 (six) hours as needed.         Marland Kitchen LOSARTAN POTASSIUM 100 MG PO TABS   Oral   Take 100 mg by  mouth daily.         Marland Kitchen METFORMIN HCL ER 500 MG PO TB24   Oral   Take 500 mg by mouth daily with breakfast.         . NORITATE 1 % EX CREA   Topical   Apply topically daily.          . TERCONAZOLE 0.4 % VA CREA   Vaginal   Place 1 applicator vaginally at bedtime.          Marland Kitchen VITAMIN C 500 MG PO TABS   Oral   Take 500 mg by mouth daily.         Marland Kitchen METOPROLOL TARTRATE 25 MG PO TABS   Oral   Take 25 mg by mouth 2 (two) times daily.         Marland Kitchen PRAVASTATIN SODIUM 40 MG PO TABS   Oral   Take 1 tablet (40 mg total) by mouth every evening.   90 tablet   2     BP 144/65  Pulse 68  Temp 98.2 F (36.8 C) (Oral)  Resp 18  Ht 5\' 5"  (1.651 m)  Wt 192 lb (87.091 kg)  BMI 31.95 kg/m2  SpO2 98%  Physical Exam  Nursing note and vitals reviewed. Constitutional: She appears well-developed and well-nourished. No distress.  HENT:  Head: Normocephalic and atraumatic.  Right Ear: External ear normal.  Left Ear: External ear normal.  Eyes: Conjunctivae normal are normal. Right eye exhibits no discharge. Left eye exhibits no discharge. No scleral icterus.  Neck: Neck supple. No tracheal deviation present.  Cardiovascular: Normal rate, regular rhythm and intact distal pulses.     Pulmonary/Chest: Effort normal and breath sounds normal. No stridor. No respiratory distress. She has no wheezes. She has no rales.  Abdominal: Soft. Bowel sounds are normal. She exhibits no distension. There is no tenderness. There is no rebound, no guarding and no CVA tenderness. No hernia.  Musculoskeletal: She exhibits no edema and no tenderness.  Neurological: She is alert. She has normal strength. No sensory deficit. Cranial nerve deficit:  no gross defecits noted. She exhibits normal muscle tone. She displays no seizure activity. Coordination normal.  Skin: Skin is warm and dry. No rash noted.  Psychiatric: She has a normal mood and affect.    ED Course  Procedures (including critical care time)  Labs Reviewed  COMPREHENSIVE METABOLIC PANEL - Abnormal; Notable for the following:    Glucose, Bld 137 (*)     ALT 36 (*)     GFR calc non Af Amer 51 (*)     GFR calc Af Amer 59 (*)     All other components within normal limits  URINALYSIS, ROUTINE W REFLEX MICROSCOPIC - Abnormal; Notable for the following:    APPearance CLOUDY (*)     Hgb urine dipstick SMALL (*)     Leukocytes, UA LARGE (*)     All other components within normal limits  URINE MICROSCOPIC-ADD ON - Abnormal; Notable for the following:    Squamous Epithelial / LPF FEW (*)     Bacteria, UA FEW (*)     Crystals CA OXALATE CRYSTALS (*)     All other components within normal limits  LIPASE, BLOOD  CBC WITH DIFFERENTIAL  URINE CULTURE   No results found.   MDM  Patient's urinalysis does suggest the possibility of a urinary tract infection. Her pain however is rather more severe than I would attribute her urinary tract infection at  this time. Considering the possibility of diverticulitis or other pathology we will get a CT of her pelvis while she's here in the emergency room.        Celene Kras, MD 04/10/12 [phone]

## 2012-04-10 LAB — URINE CULTURE

## 2012-04-10 MED ORDER — TAMSULOSIN HCL 0.4 MG PO CAPS: 0.4000 mg | ORAL_CAPSULE | Freq: Once | ORAL | Status: DC

## 2012-04-10 MED ORDER — ONDANSETRON 4 MG PO TBDP: 4.0000 mg | ORAL_TABLET | Freq: Three times a day (TID) | ORAL | Status: DC | PRN

## 2012-04-10 MED ORDER — TRAMADOL HCL 50 MG PO TABS: 50.0000 mg | ORAL_TABLET | Freq: Four times a day (QID) | ORAL | Status: DC | PRN

## 2012-04-10 MED ORDER — SULFAMETHOXAZOLE-TRIMETHOPRIM 800-160 MG PO TABS: 1.0000 | ORAL_TABLET | Freq: Two times a day (BID) | ORAL | Status: AC

## 2012-04-10 NOTE — ED Provider Notes (Signed)
Received sign-over from Dr. Lynelle Doctor at [phone].  Pt was awaiting a CT scan for further evalution of abdominal pain.  She has required several doses of dilaudid but her pain is improving.  The CT scan revals a 4mm left ureteral; stone with subsequent obstruction.  Pt has been observed in the Er for 9 hours.  Her pain has significantly improved.  Plan symptomatic care and outpt follow-up.  Discussed indications for immediate return to the emergency department.  Tobin Chad, MD 04/10/12 [phone]

## 2012-04-13 ENCOUNTER — Ambulatory Visit
Admission: RE | Admit: 2012-04-13 | Discharge: 2012-04-13 | Disposition: A | Discharge status: Home / Self Care | Payer: Medicare Other | Source: Ambulatory Visit | Attending: Internal Medicine | Admitting: Internal Medicine

## 2012-04-13 DIAGNOSIS — R921 Mammographic calcification found on diagnostic imaging of breast: Secondary | ICD-10-CM

## 2012-09-02 ENCOUNTER — Other Ambulatory Visit: Payer: Self-pay | Admitting: Neurological Surgery

## 2012-09-02 DIAGNOSIS — M545 Low back pain: Secondary | ICD-10-CM

## 2012-09-04 ENCOUNTER — Ambulatory Visit
Admission: RE | Admit: 2012-09-04 | Discharge: 2012-09-04 | Disposition: A | Discharge status: Home / Self Care | Payer: Medicare Other | Source: Ambulatory Visit | Attending: Neurological Surgery | Admitting: Neurological Surgery

## 2012-09-04 DIAGNOSIS — M545 Low back pain: Secondary | ICD-10-CM

## 2013-03-15 ENCOUNTER — Other Ambulatory Visit: Payer: Self-pay

## 2013-03-15 DIAGNOSIS — Z1231 Encounter for screening mammogram for malignant neoplasm of breast: Secondary | ICD-10-CM

## 2013-04-20 ENCOUNTER — Encounter: Payer: Self-pay | Admitting: Cardiovascular Disease

## 2013-04-20 ENCOUNTER — Ambulatory Visit (INDEPENDENT_AMBULATORY_CARE_PROVIDER_SITE_OTHER): Payer: Medicare Other | Admitting: Cardiovascular Disease

## 2013-04-20 VITALS — BP 114/72 | HR 63 | Ht 65.0 in | Wt 177.0 lb

## 2013-04-20 DIAGNOSIS — E785 Hyperlipidemia, unspecified: Secondary | ICD-10-CM

## 2013-04-20 DIAGNOSIS — Q211 Atrial septal defect: Secondary | ICD-10-CM

## 2013-04-20 DIAGNOSIS — E119 Type 2 diabetes mellitus without complications: Secondary | ICD-10-CM

## 2013-04-20 DIAGNOSIS — I10 Essential (primary) hypertension: Secondary | ICD-10-CM

## 2013-04-20 MED ORDER — RANITIDINE HCL 150 MG PO TABS: ORAL_TABLET | ORAL | Status: DC

## 2013-04-20 MED ORDER — PREDNISONE 20 MG PO TABS: ORAL_TABLET | ORAL | Status: DC

## 2013-04-20 NOTE — Patient Instructions (Signed)
Your physician has requested that you have a cardiac MRI. Cardiac MRI uses a computer to create images of your heart as its beating, producing both still and moving pictures of your heart and major blood vessels. For further information please visit [email or web address]. Please follow the instruction sheet given to you today for more information. Office will contact with results via phone or letter.   Continue all current medications. Lab for BMET - do today if possible Pre-med instructions given for dye allergy Follow up in  6-8 weeks

## 2013-04-20 NOTE — Progress Notes (Signed)
Patient ID: Julie Owen, female   DOB: 06/19/1945, 67 y.o.   MRN: [id number]      SUBJECTIVE: The patient is a 67 year old woman with a past medical history significant for an atrial septal defect, hypertension, hyperlipidemia, and diabetes mellitus. However, an echocardiogram performed on 01/22/2012 showed no evidence of atrial septal defect and a bubble study was performed. She also had normal left ventricular systolic function with an ejection fraction of 60%. It was recommended she take Crestor but the patient requested pravastatin. Statins caused hair loss and she now takes red rice yeast extract.  Interestingly, she has carried a diagnosis of an atrial septal defect since 1980. She was hospitalized and was going to have surgery for it, but then she had a pet-related emergency and cancelled the surgery. She reportedly has had multiple transesophageal echocardiograms as well as transthoracic echocardiograms confirming this. These were performed at [facility] and the records are unavailable to me. She has had to pay higher life insurance premiums because of this diagnosis. She has had to use antibiotics at times for surgeries because of this as well.  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 She denies chest pain, shortness of breath, palpitations, lightheadedness, dizziness, orthopnea, paroxysmal nocturnal dyspnea and leg swelling. 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 She recently underwent surgery on her left eye for ptosis.    Allergies  Allergen Reactions  . Aspirin   . Benzocaine   . Caffeine   . Codeine   . Percocet [Oxycodone-Acetaminophen]   . Procaine Hcl   . Pseudoephedrine Hcl Er   . Simvastatin     REACTION: hair loss    Current Outpatient Prescriptions  Medication Sig Dispense Refill  . Alpha-Lipoic Acid (LIPOIC ACID PO) Take 1,200 mg by mouth daily.      . Calcium-Magnesium 500-250 MG TABS Take 1 tablet by  mouth 2 (two) times daily.      . Cholecalciferol (VITAMIN D3) 2000 UNITS TABS Take 1 tablet by mouth daily.      Marland Kitchen CINNAMON PO Take 2,000 mg by mouth daily.       . Coenzyme Q10 (CO Q 10 PO) Take 300 mg by mouth daily.      Marland Kitchen ibuprofen (ADVIL,MOTRIN) 200 MG tablet Take 400 mg by mouth every 6 (six) hours as needed.      Marland Kitchen losartan (COZAAR) 100 MG tablet Take 50 mg by mouth daily.       . metFORMIN (GLUCOPHAGE-XR) 500 MG 24 hr tablet Take 500 mg by mouth daily with breakfast.      . NORITATE 1 % cream Apply topically daily.       . Omega 3 1000 MG CAPS Take 1,000 mg by mouth daily.      . Plant Sterols and Stanols (CHOLESTOFF) 450 MG TABS Take 900 mg by mouth daily.      . Red Yeast Rice 600 MG CAPS Take 1,200 mg by mouth daily.      Marland Kitchen terconazole (TERAZOL 7) 0.4 % vaginal cream Place 1 applicator vaginally at bedtime as needed.       . TURMERIC PO Take 400 mg by mouth daily.      . vitamin C (ASCORBIC ACID) 500 MG tablet Take 500 mg by mouth daily.       No current facility-administered medications for this visit.    Past Medical History  Diagnosis Date  . Hyperlipidemia   . Prediabetes   .  Hypertension   . Obesity   . Osteoarthritis of lumbar spine   . Osteoarthritis of thumb     bilateral  . Atrial septal defect   . Hip pain, right   . Diabetes mellitus without complication     Past Surgical History  Procedure Laterality Date  . Total hip arthroplasty  08/2010    Right hip  . Breast surgery  2008    Right breast  . Bunionectomy  1993    Right foot  . Breast reduction surgery    . Tonsillectomy      History   Social History  . Marital Status: Married    Spouse Name: N/A    Number of Children: N/A  . Years of Education: N/A   Occupational History  . Not on file.   Social History Main Topics  . Smoking status: Former Smoker    Types: Cigarettes  . Smokeless tobacco: Never Used  . Alcohol Use: 1.8 oz/week    3 Glasses of wine per week  . Drug Use: No  .  Sexual Activity: Not on file   Other Topics Concern  . Not on file   Social History Narrative  . No narrative on file     Filed Vitals:   04/20/13 1350  BP: 114/72  Pulse: 63  Height: 5\' 5"  (1.651 m)  Weight: 177 lb (80.287 kg)    PHYSICAL EXAM General: NAD Neck: No JVD, no thyromegaly or thyroid nodule.  Lungs: Clear to auscultation bilaterally with normal respiratory effort. CV: Nondisplaced PMI.  Heart regular S1/S2, no S3/S4, I/VI holosystolic murmur along left sternal border.  No peripheral edema.  No carotid bruit.  Normal pedal pulses.  Abdomen: Soft, nontender, no hepatosplenomegaly, no distention.  Neurologic: Alert and oriented x 3.  Psych: Normal affect. Extremities: No clubbing or cyanosis.   ECG: reviewed and available in electronic records.  Echo (01-22-2012): - Left ventricle: The cavity size was normal. Wall thickness was normal. The estimated ejection fraction was 60%. Wall motion was normal; there were no regional wall motion abnormalities. - Aortic valve: Sclerosis without stenosis. - Impressions: Bubble study was done with agitated saline. The images were good. There was no evidence of a shunt. Impressions:  - Bubble study was done with agitated saline. The images were good. There was no evidence of a shunt.    ASSESSMENT AND PLAN: 1. Atrial septal defect: this was not seen by her most recent echo with bubble study in 2013, although the patient tells me that the technician did note some bubbles crossing over, albeit not a large amount. This was not documented on the official report. This diagnosis has impacted her life since 1980, in particular her finances with respect to repeated procedures and her life insurance premiums. It is possible she has a higher level shunt not appreciated by transthoracic echocardiography, which may have made visualizing the shunt more difficult. It is difficult to imagine that this was appreciated for several years by  imaging at [facility] and that she was actually about to have it surgically repaired, and now it has spontaneously resolved. In any event,  in order to definitively answer this question, I will proceed with a cardiac MRI. 2. HTN: well controlled on current therapy, Cozaar 50 mg daily. 3. Hyperlipidemia: on red rice yeast extract, as she is intolerant to statins. 4. Diabetes: well controlled on only 500 mg of metformin daily.  Dispo: f/u 8 weeks.  Prentice Docker, M.D., F.A.C.C.

## 2013-04-26 ENCOUNTER — Ambulatory Visit
Admission: RE | Admit: 2013-04-26 | Discharge: 2013-04-26 | Disposition: A | Discharge status: Home / Self Care | Payer: Medicare Other | Source: Ambulatory Visit

## 2013-04-26 ENCOUNTER — Telehealth: Payer: Self-pay | Admitting: *Deleted

## 2013-04-26 DIAGNOSIS — Z1231 Encounter for screening mammogram for malignant neoplasm of breast: Secondary | ICD-10-CM

## 2013-04-26 NOTE — Telephone Encounter (Signed)
Patient has decided that she does not want to do the cardiac MRI.  States she does not feel comfortable doing this test with her dye allergy.  Patient was scheduled for follow up 06/01/2013.  Will forward to MD for notification & see when MD wants future follow up.

## 2013-04-27 NOTE — Telephone Encounter (Signed)
Obtain results from [facility] with respect to diagnostic tests showing the atrial septal defect. Once I'm able to review those records, can plan accordingly. Switch f/u appt to 6 months.

## 2013-04-28 NOTE — Telephone Encounter (Signed)
Spoke with patient this morning regarding message.  OV for 06/01/2013 has been cancelled & recall for 6 months put in system.  Will mail medical release for patient to sign & send back in as she lives in [place] and does not come down this way very often.

## 2013-06-01 ENCOUNTER — Ambulatory Visit: Payer: Medicare Other | Admitting: Cardiovascular Disease

## 2013-10-29 ENCOUNTER — Institutional Professional Consult (permissible substitution): Payer: Self-pay | Admitting: Neurology

## 2013-11-04 ENCOUNTER — Ambulatory Visit (INDEPENDENT_AMBULATORY_CARE_PROVIDER_SITE_OTHER): Payer: Commercial Managed Care - HMO | Admitting: Neurology

## 2013-11-04 ENCOUNTER — Encounter (INDEPENDENT_AMBULATORY_CARE_PROVIDER_SITE_OTHER): Payer: Self-pay

## 2013-11-04 ENCOUNTER — Encounter: Payer: Self-pay | Admitting: Neurology

## 2013-11-04 VITALS — BP 117/70 | HR 68 | Resp 16 | Ht 66.0 in | Wt 186.0 lb

## 2013-11-04 DIAGNOSIS — R0989 Other specified symptoms and signs involving the circulatory and respiratory systems: Secondary | ICD-10-CM

## 2013-11-04 DIAGNOSIS — R351 Nocturia: Secondary | ICD-10-CM

## 2013-11-04 DIAGNOSIS — R0609 Other forms of dyspnea: Secondary | ICD-10-CM

## 2013-11-04 DIAGNOSIS — G4700 Insomnia, unspecified: Secondary | ICD-10-CM | POA: Insufficient documentation

## 2013-11-04 DIAGNOSIS — R0683 Snoring: Secondary | ICD-10-CM

## 2013-11-04 NOTE — Patient Instructions (Signed)
Obesity Obesity is defined as having too much total body fat and a body mass index (BMI) of 30 or more. BMI is an estimate of body fat and is calculated from your height and weight. Obesity happens when you consume more calories than you can burn by exercising or performing daily physical tasks. Prolonged obesity can cause major illnesses or emergencies, such as:   A stroke.  Heart disease.  Diabetes.  Cancer.  Arthritis.  High blood pressure (hypertension).  High cholesterol.  Sleep apnea.  Erectile dysfunction.  Infertility problems. CAUSES   Regularly eating unhealthy foods.  Physical inactivity.  Certain disorders, such as an underactive thyroid (hypothyroidism), Cushing's syndrome, and polycystic ovarian syndrome.  Certain medicines, such as steroids, some depression medicines, and antipsychotics.  Genetics.  Lack of sleep. DIAGNOSIS  A caregiver can diagnose obesity after calculating your BMI. Obesity will be diagnosed if your BMI is 30 or higher.  There are other methods of measuring obesity levels. Some other methods include measuring your skin fold thickness, your waist circumference, and comparing your hip circumference to your waist circumference. TREATMENT  A healthy treatment program includes some or all of the following:  Long-term dietary changes.  Exercise and physical activity.  Behavioral and lifestyle changes.  Medicine only under the supervision of your caregiver. Medicines may help, but only if they are used with diet and exercise programs. An unhealthy treatment program includes:  Fasting.  Fad diets.  Supplements and drugs. These choices do not succeed in long-term weight control.  HOME CARE INSTRUCTIONS   Exercise and perform physical activity as directed by your caregiver. To increase physical activity, try the following:  Use stairs instead of elevators.  Park farther away from store entrances.  Garden, bike, or walk instead of  watching television or using the computer.  Eat healthy, low-calorie foods and drinks on a regular basis. Eat more fruits and vegetables. Use low-calorie cookbooks or take healthy cooking classes.  Limit fast food, sweets, and processed snack foods.  Eat smaller portions.  Keep a daily journal of everything you eat. There are many free websites to help you with this. It may be helpful to measure your foods so you can determine if you are eating the correct portion sizes.  Avoid drinking alcohol. Drink more water and drinks without calories.  Take vitamins and supplements only as recommended by your caregiver.  Weight-loss support groups, Nurse, mental health, counselors, and stress reduction education can also be very helpful. SEEK IMMEDIATE MEDICAL CARE IF:  You have chest pain or tightness.  You have trouble breathing or feel short of breath.  You have weakness or leg numbness.  You feel confused or have trouble talking.  You have sudden changes in your vision. MAKE SURE YOU:  Understand these instructions.  Will watch your condition.  Will get help right away if you are not doing well or get worse. Document Released: 06/06/2004 Document Revised: 10/29/2011 Document Reviewed: 06/05/2011 [facility] Patient Information 2015 [place], [place]. This information is not intended to replace advice given to you by your health care provider. Make sure you discuss any questions you have with your health care provider.

## 2013-11-04 NOTE — Progress Notes (Signed)
[facility] SLEEP MEDICINE CONSULTATION   Provider:  Larey Seat, M D  Referring Provider: Leanna Battles, MD Primary Care Physician:  Donnajean Lopes, MD  Chief Complaint  Patient presents with  . New Evaluation    Room 11  . Sleep Consult    HPI:  Julie Owen is a 68 y.o. female, who is seen here as a referral from Dr. Philip Aspen for an insomnia and snoring evaluation, ruling out apnea.    The patient is a retired Pharmacist, hospital, and still has some engrained sleep habits, such as rising at 5 AM as she did for over 40 years. She is not sleepy but fatigued, she exclaimed. She had related the changes in her sleep pattern to her menopause. She developed palpitations from caffeine in peri menopause, felt panicked and anxious. She used" to sleep like a baby", but at age 67 she found herself in completed  Menopause. She could finally relate hot flushes and mood changes, tearfulness. Her sleep never recovered.   She gained weight and begun snoring. Her sleep has become less and less deep, restorative.    The patient reports she usually goes to bed around 9 PM watches TV in the bedroom. About one hour later she will go to sleep, but wakes up 2-3 times to go to the bathroom. If she turns over she will wake up, too. She has sometimes trouble to go back to sleep.  She always wakes up at 2.30 and she finds no further restful sleep after that, rises at 5.30, spontaneously, no alarm needed.  She keeps a glass of water right beside her bed because she often has a dry mouth when waking up. She does not report any nocturnal headaches usually. The overall nocturnal sleep time is between 4-5 hours only, she takes  One hour nap in the afternoon, about 2-3 PM, and on her sofa. Her husband sleeps not in the same room, she snores and has woken herself up form snoring. Her dog sleeps nearby, but never wakes her. She has owned a dog before that needed physically to be brought outside to urinate ,  7-8 times at night. This dog died about 2 years ago.  She doesn't take any caffeine anymore. The sleep pattern may have changed than.   Family history - one cousin with OSA , non compliant  CPAP . Reviewed medication and alLergies,  HISTORY       Review of Systems: Out of a complete 14 system review, the patient complains of only the following symptoms, and all other reviewed systems are negative.  Epworth 10, GDS 9, FSS 25 .   History   Social History  . Marital Status: Married    Spouse Name: Gwyndolyn Saxon    Number of Children: 0  . Years of Education: Master's   Occupational History  . Not on file.   Social History Main Topics  . Smoking status: Former Smoker    Types: Cigarettes  . Smokeless tobacco: Never Used  . Alcohol Use: 1.8 oz/week    3 Glasses of wine per week     Comment: occas.  . Drug Use: No  . Sexual Activity: Not on file   Other Topics Concern  . Not on file   Social History Narrative   Patient is married Gwyndolyn Saxon) and lives at home with her husband.   Patient is a retired Pharmacist, hospital.   Patient has a Master's degree.   Patient is right-handed.   Patient does not drink any  caffeine.    Family History  Problem Relation Age of Onset  . Hypertension Other   . Kidney disease Other   . Stroke Other   . Heart attack Other   . Cancer Other   . Obesity Other   . Hyperlipidemia Mother   . Hypertension Mother   . Heart disease Mother   . Macular degeneration Father   . Cancer Sister     colon  . Hypertension Sister     Past Medical History  Diagnosis Date  . Hyperlipidemia   . Prediabetes   . Hypertension   . Obesity   . Osteoarthritis of lumbar spine   . Osteoarthritis of thumb     bilateral  . Atrial septal defect   . Hip pain, right   . Diabetes mellitus without complication     Past Surgical History  Procedure Laterality Date  . Total hip arthroplasty  08/2010    Right hip  . Breast surgery  2008    Right breast  . Bunionectomy   1993    Right foot  . Breast reduction surgery    . Tonsillectomy    . Eyelid surgery      Current Outpatient Prescriptions  Medication Sig Dispense Refill  . Alpha-Lipoic Acid (LIPOIC ACID PO) Take 1,200 mg by mouth daily.      . Calcium-Magnesium 500-250 MG TABS Take 1 tablet by mouth 2 (two) times daily.      . Cholecalciferol (VITAMIN D3) 2000 UNITS TABS Take 1 tablet by mouth daily.      Marland Kitchen CINNAMON PO Take 2,000 mg by mouth daily.       . Coenzyme Q10 (CO Q 10 PO) Take 300 mg by mouth daily.      Marland Kitchen ibuprofen (ADVIL,MOTRIN) 200 MG tablet Take 400 mg by mouth every 6 (six) hours as needed.      Marland Kitchen losartan (COZAAR) 100 MG tablet Take 50 mg by mouth daily.       . metFORMIN (GLUCOPHAGE-XR) 500 MG 24 hr tablet Take 500 mg by mouth daily with breakfast.      . NORITATE 1 % cream Apply topically daily.       . Omega 3 1000 MG CAPS Take 1,000 mg by mouth daily.      . Plant Sterols and Stanols (CHOLESTOFF) 450 MG TABS Take 900 mg by mouth daily.      . Red Yeast Rice 600 MG CAPS Take 1,200 mg by mouth daily.      Marland Kitchen terconazole (TERAZOL 7) 0.4 % vaginal cream Place 1 applicator vaginally at bedtime as needed.       . vitamin C (ASCORBIC ACID) 500 MG tablet Take 500 mg by mouth daily.      . pravastatin (PRAVACHOL) 40 MG tablet 1 tablet daily.       No current facility-administered medications for this visit.    Allergies as of 11/04/2013 - Review Complete 11/04/2013  Allergen Reaction Noted  . Aspirin Other (See Comments)   . Benzocaine  06/26/2011  . Caffeine  06/26/2011  . Codeine Other (See Comments)   . Lidocaine-epinephrine Other (See Comments) 11/04/2013  . Oxycodone-acetaminophen Other (See Comments) 11/04/2013  . Percocet [oxycodone-acetaminophen]  06/26/2011  . Procaine hcl  06/26/2011  . Pseudoephedrine hcl er  06/26/2011  . Simvastatin      Vitals: BP 117/70  Pulse 68  Resp 16  Ht 5\' 6"  (1.676 m)  Wt 186 lb (84.369 kg)  BMI 30.04 kg/m2  Last Weight:  Wt  Readings from Last 1 Encounters:  11/04/13 186 lb (84.369 kg)   Last Height:   Ht Readings from Last 1 Encounters:  11/04/13 5\' 6"  (1.676 m)    Physical exam:  General: The patient is awake, alert and appears not in acute distress. The patient is well groomed. Head: Normocephalic, atraumatic. Neck is supple. Mallampati 4, neck circumference:16 inches. Retrognathia.  Cardiovascular:  Regular rate and rhythm, without  murmurs or carotid bruit, and without distended neck veins. Respiratory: Lungs are clear to auscultation. Skin:  Without evidence of edema, or rash Trunk: BMI is elevated and patient  has normal posture.  Neurologic exam : The patient is awake and alert, oriented to place and time.  Memory subjective described as intact.  There is a normal attention span & concentration ability. Speech is fluent without  dysarthria, dysphonia or aphasia.  Mood and affect are appropriate.  Cranial nerves: Pupils are equal and briskly reactive to light. Funduscopic exam without  evidence of pallor or edema.  Extraocular movements  in vertical and horizontal planes intact and without nystagmus. Visual fields by finger perimetry are intact. Hearing to finger rub intact.  Facial sensation intact to fine touch. Facial motor strength is symmetric and tongue and uvula move midline.  Motor exam:  Normal tone , muscle bulk and symmetric normal strength in all extremities.  Sensory:  Fine touch, pinprick and vibration were tested in all extremities.  Proprioception is tested in the upper extremities only. This was normal.  Coordination: Rapid alternating movements in the fingers/hands is tested and normal.  Finger-to-nose maneuver tested and normal without evidence of ataxia, dysmetria or tremor.  Gait and station: Patient walks without assistive device and is able and assisted stool climb up to the exam table.  Strength within normal limits. Stance is stable and normal. Tandem gait is  fragmented. Romberg testing is normal.  Deep tendon reflexes: in the  upper and lower extremities are symmetric and intact. Babinski maneuver response downgoing.   Assessment:  After physical and neurologic examination, review of laboratory studies, imaging, neurophysiology testing and pre-existing records, assessment is   1) moderate risk for sleep apnea. , high grade Mallampati , BMI elevated and snoring.  Ordered a sleep study with SPLIT instructions . Her weight is her main risk factor.  Nocturia may be a symptom.   Plan:

## 2013-11-05 ENCOUNTER — Ambulatory Visit (INDEPENDENT_AMBULATORY_CARE_PROVIDER_SITE_OTHER): Payer: Commercial Managed Care - HMO | Admitting: Cardiovascular Disease

## 2013-11-05 ENCOUNTER — Encounter: Payer: Self-pay | Admitting: Cardiovascular Disease

## 2013-11-05 VITALS — BP 114/71 | HR 76 | Ht 65.0 in | Wt 187.0 lb

## 2013-11-05 DIAGNOSIS — Q211 Atrial septal defect, unspecified: Secondary | ICD-10-CM

## 2013-11-05 DIAGNOSIS — I10 Essential (primary) hypertension: Secondary | ICD-10-CM

## 2013-11-05 DIAGNOSIS — E1142 Type 2 diabetes mellitus with diabetic polyneuropathy: Secondary | ICD-10-CM

## 2013-11-05 DIAGNOSIS — E1349 Other specified diabetes mellitus with other diabetic neurological complication: Secondary | ICD-10-CM

## 2013-11-05 DIAGNOSIS — I739 Peripheral vascular disease, unspecified: Secondary | ICD-10-CM

## 2013-11-05 DIAGNOSIS — Q2111 Secundum atrial septal defect: Secondary | ICD-10-CM

## 2013-11-05 DIAGNOSIS — I779 Disorder of arteries and arterioles, unspecified: Secondary | ICD-10-CM

## 2013-11-05 DIAGNOSIS — E785 Hyperlipidemia, unspecified: Secondary | ICD-10-CM

## 2013-11-05 DIAGNOSIS — E0840 Diabetes mellitus due to underlying condition with diabetic neuropathy, unspecified: Secondary | ICD-10-CM

## 2013-11-05 NOTE — Patient Instructions (Signed)
Continue all current medications. Your physician wants you to follow up in: 6 months.  You will receive a reminder letter in the mail one-two months in advance.  If you don't receive a letter, please call our office to schedule the follow up appointment   

## 2013-11-05 NOTE — Progress Notes (Addendum)
Patient ID: Julie Owen, female   DOB: [birth year], 68 y.o.   MRN: [id number]      SUBJECTIVE: The patient is a 68 year old woman with a past medical history significant for an atrial septal defect, hypertension, hyperlipidemia, and diabetes mellitus. However, an echocardiogram performed on 01/22/2012 showed no evidence of atrial septal defect and a bubble study was performed. She also had normal left ventricular systolic function with an ejection fraction of 60%. At her previous appointment, I ordered a cardiac MRI but she refused to proceed with it due to concerns about a dye allergy.  She has been exercising and trying to lose weight, and denies exertional chest pain, palpitations, dizziness, lightheadedness, leg swelling and dyspnea. 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  She recently underwent a LifeScan which demonstrated mild carotid plaque disease bilaterally, normal blood pressure, HDL 67, LDL 123.  She was reportedly scheduled to undergo a procedure to close what sounds like a PFO in 2006 but she deferred due to a pet-related emergency.    Allergies  Allergen Reactions  . Aspirin Other (See Comments)  . Benzocaine   . Caffeine   . Codeine Other (See Comments)  . Lidocaine-Epinephrine Other (See Comments)  . Oxycodone-Acetaminophen Other (See Comments)  . Percocet [Oxycodone-Acetaminophen]   . Procaine Hcl   . Pseudoephedrine Hcl Er   . Simvastatin     REACTION: hair loss    Current Outpatient Prescriptions  Medication Sig Dispense Refill  . Alpha-Lipoic Acid (LIPOIC ACID PO) Take 1,200 mg by mouth daily.      . Calcium-Magnesium 500-250 MG TABS Take 1 tablet by mouth 2 (two) times daily.      . Cholecalciferol (VITAMIN D3) 2000 UNITS TABS Take 1 tablet by mouth daily.      Marland Kitchen CINNAMON PO Take 2,000 mg by mouth daily.       . Coenzyme Q10 (CO Q 10 PO) Take 300 mg by mouth daily.      Marland Kitchen ibuprofen (ADVIL,MOTRIN) 200 MG tablet Take 400 mg by mouth every 6  (six) hours as needed.      Marland Kitchen losartan (COZAAR) 100 MG tablet Take 50 mg by mouth daily.       . metFORMIN (GLUCOPHAGE-XR) 500 MG 24 hr tablet Take 500 mg by mouth daily with breakfast.      . NORITATE 1 % cream Apply topically daily.       . Omega 3 1000 MG CAPS Take 1,000 mg by mouth daily.      . Plant Sterols and Stanols (CHOLESTOFF) 450 MG TABS Take 900 mg by mouth daily.      . pravastatin (PRAVACHOL) 40 MG tablet 1 tablet daily.      . Red Yeast Rice 600 MG CAPS Take 1,200 mg by mouth daily.      Marland Kitchen terconazole (TERAZOL 7) 0.4 % vaginal cream Place 1 applicator vaginally at bedtime as needed.       . vitamin C (ASCORBIC ACID) 500 MG tablet Take 500 mg by mouth daily.       No current facility-administered medications for this visit.    Past Medical History  Diagnosis Date  . Hyperlipidemia   . Prediabetes   . Hypertension   . Obesity   . Osteoarthritis of lumbar spine   . Osteoarthritis of thumb     bilateral  . Atrial septal defect   . Hip pain, right   . Diabetes mellitus without complication     Past  Surgical History  Procedure Laterality Date  . Total hip arthroplasty  08/2010    Right hip  . Breast surgery  2008    Right breast  . Bunionectomy  1993    Right foot  . Breast reduction surgery    . Tonsillectomy    . Eyelid surgery      History   Social History  . Marital Status: Married    Spouse Name: Gwyndolyn Saxon    Number of Children: 0  . Years of Education: Master's   Occupational History  . Not on file.   Social History Main Topics  . Smoking status: Former Smoker    Types: Cigarettes  . Smokeless tobacco: Never Used  . Alcohol Use: 1.8 oz/week    3 Glasses of wine per week     Comment: occas.  . Drug Use: No  . Sexual Activity: Not on file   Other Topics Concern  . Not on file   Social History Narrative   Patient is married Gwyndolyn Saxon) and lives at home with her husband.   Patient is a retired Pharmacist, hospital.   Patient has a Master's degree.    Patient is right-handed.   Patient does not drink any caffeine.    BP: 114/71 HR: 76  PHYSICAL EXAM General: NAD  Neck: No JVD, no thyromegaly or thyroid nodule.  Lungs: Clear to auscultation bilaterally with normal respiratory effort.  CV: Nondisplaced PMI. Heart regular S1/S2, no H2/D9, I/VI holosystolic murmur along left sternal border. No peripheral edema. No carotid bruit. Normal pedal pulses.  Abdomen: Soft, nontender, no hepatosplenomegaly, no distention.  Neurologic: Alert and oriented x 3.  Psych: Normal affect.  Extremities: No clubbing or cyanosis.   ECG: reviewed and available in electronic records.      ASSESSMENT AND PLAN: 1. Atrial septal defect: Again, this was not seen by her most recent echo with bubble study in 2013, although the patient previously told me that the technician did note some bubbles crossing over, albeit not a large amount. This was not documented on the official report. This diagnosis has impacted her life since 1980, in particular her finances with respect to repeated procedures and her life insurance premiums. It is possible she has a higher level shunt not appreciated by transthoracic echocardiography, which may have made visualizing the shunt more difficult. It is difficult to imagine that this was appreciated for several years by imaging at [facility] and that she was actually about to have it surgically repaired, and now it has spontaneously resolved. In order to definitively answer this question, I previously ordered a cardiac MRI but the patient was unwilling to proceed.  I informed her that I would make sure she is adequately premedicated with steroids and antihistamines prior to undergoing a cardiac MRI should she decide to proceed with it. I have asked her to contact her physicians at [facility] herself to obtain her cardiac records, as my staff has tried on multiple occasions and been unsuccessful. 2. HTN: Well controlled on current therapy, Cozaar 50  mg daily.  3. Hyperlipidemia: She is attempting pravastatin three times per week. 4. Diabetes: Well controlled on only 500 mg of metformin daily.  5. Bilateral carotid artery disease: Documented as mild. I do not appreciate any bruits. I would consider Dopplers in the future.  Dispo: f/u 6 months.   Kate Sable, M.D., F.A.C.C.  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 There  was a plan for closure by Dr. Dwain Sarna with a percutaneous closure device in June/July 2006 as per the notes. This was never pursued by the patient.

## 2013-11-29 ENCOUNTER — Telehealth: Payer: Self-pay | Admitting: *Deleted

## 2013-11-29 NOTE — Telephone Encounter (Signed)
Patient notified.  States she will think about this to decide if she would like to move forward with MRI.  Stated she will call back to let us know.

## 2013-11-29 NOTE — Telephone Encounter (Signed)
Message copied by Laurine Blazer on Mon Nov 29, 2013  2:25 PM ------      Message from: Kate Sable A      Created: Wed Nov 24, 2013  5:53 PM      Regarding: ASD       I reviewed all the pertinent notes and studies from [place]. 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 Dwain Sarna in June/July 2006.            At this point, I would still recommend a cardiac MRI be pursued. This will help to quantify her shunt. It would be the best test to assess it, and to see if closure is in fact warranted.             Premedications for a presumed dye allergy can always be given, but I will leave this decision up to her.            Dr. Bronson Ing ------

## 2013-12-17 ENCOUNTER — Ambulatory Visit (INDEPENDENT_AMBULATORY_CARE_PROVIDER_SITE_OTHER): Payer: Commercial Managed Care - HMO | Admitting: Neurology

## 2013-12-17 ENCOUNTER — Encounter: Payer: Self-pay | Admitting: Neurology

## 2013-12-17 DIAGNOSIS — G4700 Insomnia, unspecified: Secondary | ICD-10-CM

## 2013-12-17 DIAGNOSIS — G4733 Obstructive sleep apnea (adult) (pediatric): Secondary | ICD-10-CM

## 2013-12-17 DIAGNOSIS — R0683 Snoring: Secondary | ICD-10-CM

## 2013-12-17 DIAGNOSIS — R0902 Hypoxemia: Secondary | ICD-10-CM

## 2013-12-17 DIAGNOSIS — R351 Nocturia: Secondary | ICD-10-CM

## 2014-01-12 ENCOUNTER — Other Ambulatory Visit: Payer: Self-pay | Admitting: Neurology

## 2014-01-12 ENCOUNTER — Telehealth: Payer: Self-pay | Admitting: Neurology

## 2014-01-12 DIAGNOSIS — G4733 Obstructive sleep apnea (adult) (pediatric): Secondary | ICD-10-CM

## 2014-01-12 DIAGNOSIS — R0902 Hypoxemia: Secondary | ICD-10-CM

## 2014-01-12 NOTE — Telephone Encounter (Signed)
I called the patient and reviewed her sleep study results with her.  She is aware that mild obstructive sleep apnea was found but significant hypoxemia.  Due to these findings, Dr. Brett Fairy has recommended that she return for a CPAP titration study.  The patient has scheduled her titration study appointment.  A copy of her sleep study will be faxed to Dr. Leanna Battles.  The patient will receive her sleep study report via mail.

## 2014-02-18 ENCOUNTER — Ambulatory Visit (INDEPENDENT_AMBULATORY_CARE_PROVIDER_SITE_OTHER): Payer: Commercial Managed Care - HMO

## 2014-02-18 DIAGNOSIS — G4761 Periodic limb movement disorder: Secondary | ICD-10-CM

## 2014-02-18 DIAGNOSIS — R0902 Hypoxemia: Secondary | ICD-10-CM

## 2014-02-18 DIAGNOSIS — G4733 Obstructive sleep apnea (adult) (pediatric): Secondary | ICD-10-CM

## 2014-03-03 ENCOUNTER — Other Ambulatory Visit: Payer: Self-pay | Admitting: Neurology

## 2014-03-03 ENCOUNTER — Encounter: Payer: Self-pay | Admitting: *Deleted

## 2014-03-03 ENCOUNTER — Telehealth: Payer: Self-pay | Admitting: *Deleted

## 2014-03-03 DIAGNOSIS — G4733 Obstructive sleep apnea (adult) (pediatric): Secondary | ICD-10-CM

## 2014-03-03 NOTE — Telephone Encounter (Signed)
Patient was contacted and provided the results of her overnight CPAP titration study.  Patient was referred to [facility] for CPAP set up given she has [employer] as insurance and they accept her insurance.  Patient was mailed a copy of the report and a copy was faxed to Dr. Leanna Battles.   Patient instructed to contact our office 6-8 weeks post set up to schedule a follow up appointment.

## 2014-04-18 ENCOUNTER — Other Ambulatory Visit: Payer: Self-pay

## 2014-04-18 DIAGNOSIS — Z1231 Encounter for screening mammogram for malignant neoplasm of breast: Secondary | ICD-10-CM

## 2014-05-05 ENCOUNTER — Ambulatory Visit
Admission: RE | Admit: 2014-05-05 | Discharge: 2014-05-05 | Disposition: A | Discharge status: Home / Self Care | Payer: Commercial Managed Care - HMO | Source: Ambulatory Visit

## 2014-05-05 DIAGNOSIS — Z1231 Encounter for screening mammogram for malignant neoplasm of breast: Secondary | ICD-10-CM

## 2014-05-10 ENCOUNTER — Ambulatory Visit: Payer: Commercial Managed Care - HMO | Admitting: Cardiovascular Disease

## 2014-05-12 ENCOUNTER — Encounter: Payer: Self-pay | Admitting: Cardiovascular Disease

## 2014-05-12 ENCOUNTER — Ambulatory Visit (INDEPENDENT_AMBULATORY_CARE_PROVIDER_SITE_OTHER): Payer: Commercial Managed Care - HMO | Admitting: Cardiovascular Disease

## 2014-05-12 VITALS — BP 115/66 | HR 64 | Ht 66.0 in | Wt 189.0 lb

## 2014-05-12 DIAGNOSIS — Q211 Atrial septal defect, unspecified: Secondary | ICD-10-CM

## 2014-05-12 DIAGNOSIS — Z7182 Exercise counseling: Secondary | ICD-10-CM

## 2014-05-12 DIAGNOSIS — Z713 Dietary counseling and surveillance: Secondary | ICD-10-CM

## 2014-05-12 DIAGNOSIS — E785 Hyperlipidemia, unspecified: Secondary | ICD-10-CM

## 2014-05-12 DIAGNOSIS — I739 Peripheral vascular disease, unspecified: Secondary | ICD-10-CM

## 2014-05-12 DIAGNOSIS — E0840 Diabetes mellitus due to underlying condition with diabetic neuropathy, unspecified: Secondary | ICD-10-CM

## 2014-05-12 DIAGNOSIS — I779 Disorder of arteries and arterioles, unspecified: Secondary | ICD-10-CM

## 2014-05-12 DIAGNOSIS — I10 Essential (primary) hypertension: Secondary | ICD-10-CM

## 2014-05-12 DIAGNOSIS — Z136 Encounter for screening for cardiovascular disorders: Secondary | ICD-10-CM

## 2014-05-12 DIAGNOSIS — Z719 Counseling, unspecified: Secondary | ICD-10-CM

## 2014-05-12 NOTE — Patient Instructions (Signed)
Continue all current medications. Your physician wants you to follow up in:  1 year.  You will receive a reminder letter in the mail one-two months in advance.  If you don't receive a letter, please call our office to schedule the follow up appointment   

## 2014-05-12 NOTE — Progress Notes (Signed)
Patient ID: Julie Owen, female   DOB: [birth year], 68 y.o.   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 There were plans for percutaneous closure by a Dr. Dwain Sarna in June/July 2006. However, this was not seen by her most recent echo with bubble study in 2013. She refused to undergo a cardiac MRI due to fears of dye allergy. She also has hypertension, hyperlipidemia, mild sleep apnea, and carotid artery disease. The patient denies any symptoms of chest pain, palpitations, shortness of breath, lightheadedness, dizziness, leg swelling, orthopnea, PND, and syncope. She exercises using the Elliptical and Stairmaster at the gym without limitations. She has bilateral feet neuropathy but says her HbA1C has been under good control.   Review of Systems: As per "subjective", otherwise negative.  Allergies  Allergen Reactions  . Aspirin Other (See Comments)  . Benzocaine   . Caffeine   . Codeine Other (See Comments)  . Lidocaine-Epinephrine Other (See Comments)  . Oxycodone-Acetaminophen Other (See Comments)  . Percocet [Oxycodone-Acetaminophen]   . Procaine Hcl   . Pseudoephedrine Hcl Er   . Simvastatin     REACTION: hair loss    Current Outpatient Prescriptions  Medication Sig Dispense Refill  . Cholecalciferol (VITAMIN D3) 2000 UNITS TABS Take 1 tablet by mouth daily.    . Coenzyme Q10 (CO Q 10 PO) Take 300 mg by mouth daily.    Marland Kitchen ibuprofen (ADVIL,MOTRIN) 200 MG tablet Take 400 mg by mouth every 6 (six) hours as needed.    Marland Kitchen losartan (COZAAR) 100 MG tablet Take 50 mg by mouth daily.     . metFORMIN (GLUCOPHAGE-XR) 500 MG 24 hr tablet Take 500 mg by mouth daily with breakfast.    . NORITATE 1 % cream Apply topically daily.     . Omega 3 1000 MG CAPS Take 1,000 mg by mouth daily.    . Red Yeast Rice 600 MG CAPS Take 1,200 mg by mouth daily.    Marland Kitchen terconazole (TERAZOL 7) 0.4 % vaginal cream Place 1  applicator vaginally at bedtime as needed.      No current facility-administered medications for this visit.    Past Medical History  Diagnosis Date  . Hyperlipidemia   . Prediabetes   . Hypertension   . Obesity   . Osteoarthritis of lumbar spine   . Osteoarthritis of thumb     bilateral  . Atrial septal defect   . Hip pain, right   . Diabetes mellitus without complication     Past Surgical History  Procedure Laterality Date  . Total hip arthroplasty  08/2010    Right hip  . Breast surgery  2008    Right breast  . Bunionectomy  1993    Right foot  . Breast reduction surgery    . Tonsillectomy    . Eyelid surgery      History   Social History  . Marital Status: Married    Spouse Name: Gwyndolyn Saxon    Number of Children: 0  . Years of Education: Master's   Occupational History  . Not on file.   Social History Main Topics  . Smoking status: Former Smoker    Types: Cigarettes    Start date: 12/11/1965    Quit date: 07/30/1975  . Smokeless tobacco: Never Used  . Alcohol Use: 1.8 oz/week    3 Glasses of wine per week  Comment: occas.  . Drug Use: No  . Sexual Activity: Not on file   Other Topics Concern  . Not on file   Social History Narrative   Patient is married Gwyndolyn Saxon) and lives at home with her husband.   Patient is a retired Pharmacist, hospital.   Patient has a Master's degree.   Patient is right-handed.   Patient does not drink any caffeine.     Filed Vitals:   05/12/14 1249  Height: 5\' 6"  (1.676 m)  Weight: 189 lb (85.73 kg)   BP 115/66  Pulse 64  SpO2 97%   PHYSICAL EXAM General: NAD HEENT: Normal. Neck: No JVD, no thyromegaly. Lungs: Clear to auscultation bilaterally with normal respiratory effort. CV: Nondisplaced PMI.  Regular rate and rhythm, normal S1/S2, no S3/S4, no murmur. No pretibial or periankle edema.  No carotid bruit.  Normal pedal pulses.  Abdomen: Soft, nontender, no hepatosplenomegaly, no distention.  Neurologic: Alert and  oriented x 3.  Psych: Normal affect. Skin: Normal. Musculoskeletal: Normal range of motion, no gross deformities. Extremities: No clubbing or cyanosis.   ECG: Most recent ECG reviewed.      ASSESSMENT AND PLAN: 1. Atrial septal defect/low secundum ASD: She is asymptomatic.This was not seen by her most recent echo with bubble study in 2013, although the patient previously told me that the technician did note some bubbles crossing over, albeit not a large amount. This was not documented on the official report. This diagnosis has impacted her life since 1980, in particular her finances with respect to repeated procedures and her life insurance premiums. In order to definitively answer this question, I previously ordered a cardiac MRI but the patient was unwilling to proceed. I informed her that I would make sure she is adequately premedicated with steroids and antihistamines prior to undergoing a cardiac MRI should she decide to proceed with it. I also discussed the option of right heart catheterization with a saturation run for confirmatory purposes. At this point, she has opted for observation. 2. Essential HTN: Well controlled on current therapy, Cozaar 50 mg daily.  3. Hyperlipidemia: She is attempting pravastatin three times per week. 4. Diabetes: Well controlled on only 500 mg of metformin daily.  5. Bilateral carotid artery disease: Documented as mild. I do not appreciate any bruits. I would consider Dopplers in the future. 6. Sleep apnea: On CPAP. 7. Exercise and dietary counseling provided.  Dispo: f/u 1 year.   Kate Sable, M.D., F.A.C.C.

## 2014-06-09 ENCOUNTER — Encounter: Payer: Self-pay | Admitting: Neurology

## 2014-06-10 ENCOUNTER — Encounter: Payer: Self-pay | Admitting: Neurology

## 2014-06-10 ENCOUNTER — Ambulatory Visit (INDEPENDENT_AMBULATORY_CARE_PROVIDER_SITE_OTHER): Payer: Commercial Managed Care - HMO | Admitting: Neurology

## 2014-06-10 VITALS — BP 139/76 | HR 61 | Resp 16 | Ht 66.0 in | Wt 197.8 lb

## 2014-06-10 DIAGNOSIS — G4733 Obstructive sleep apnea (adult) (pediatric): Secondary | ICD-10-CM | POA: Insufficient documentation

## 2014-06-10 DIAGNOSIS — G4734 Idiopathic sleep related nonobstructive alveolar hypoventilation: Secondary | ICD-10-CM

## 2014-06-10 DIAGNOSIS — Z9989 Dependence on other enabling machines and devices: Principal | ICD-10-CM

## 2014-06-10 DIAGNOSIS — Z9189 Other specified personal risk factors, not elsewhere classified: Secondary | ICD-10-CM

## 2014-06-10 NOTE — Progress Notes (Signed)
[facility] SLEEP MEDICINE CONSULTATION   Provider:  Larey Seat, M D  Referring Provider: Leanna Battles, MD Primary Care Physician:  Donnajean Lopes, MD  No chief complaint on file.   HPI:  Julie Owen is a 69 y.o. female, who is seen here as a referral from Dr. Philip Aspen for an insomnia and snoring evaluation, ruling out apnea.    The patient is a retired Pharmacist, hospital, and still has some engrained sleep habits, such as rising at 5 AM as she did for over 40 years. She is not sleepy but fatigued, she exclaimed. She had related the changes in her sleep pattern to her menopause. She developed palpitations from caffeine in peri menopause, felt panicked and anxious. She used" to sleep like a baby", but at age 89 she found herself in completed  Menopause. She could finally relate hot flushes and mood changes, tearfulness. Her sleep never recovered.   She gained weight and begun snoring. Her sleep has become less and less deep, restorative.    The patient reports she usually goes to bed around 9 PM watches TV in the bedroom. About one hour later she will go to sleep, but wakes up 2-3 times to go to the bathroom. If she turns over she will wake up, too. She has sometimes trouble to go back to sleep.  She always wakes up at 2.30 and she finds no further restful sleep after that, rises at 5.30, spontaneously, no alarm needed.  She keeps a glass of water right beside her bed because she often has a dry mouth when waking up. She does not report any nocturnal headaches usually. The overall nocturnal sleep time is between 4-5 hours only, she takes  One hour nap in the afternoon, about 2-3 PM, and on her sofa. Her husband sleeps not in the same room, she snores and has woken herself up form snoring. Her dog sleeps nearby, but never wakes her. She has owned a dog before that needed physically to be brought outside to urinate , 7-8 times at night. This dog died about 2 years ago.  She  doesn't take any caffeine anymore. The sleep pattern may have changed than.   Family history - one cousin with OSA , non compliant  CPAP . Reviewed medication and alLergies,  HISTORY   06-10-14 [name] is here today for her first visit after sleep study and CPAP titration. The patient underwent a polysomnography on 12-17-13 which revealed a mild apnea with an AHI of 10.5 and an RDI of 14.6 per hour but associated with oxygen desaturations. In the setting of snoring and periodic limb movements a CPAP titration was not performed the same night as the patient did not have enough sleep time at line. She returned on 02-18-14 for the CPAP titration her AHI was reduced to 1.4 periodic limb movements temple rarely increased and she did best at 8 cm water pressure under which she slept 492 minutes. She also experienced a REM rebound of 45 minutes the setting of 8 cm water pressure allowed for an AHI of 0.9 the patient could sleep on her right side and tolerated a nasal pillow very well. I am meeting today for compliance visit and Mrs. Guzy has told me that she started with CPAP treatment on December 8 and for the first month did exceedingly well she had a very low threshold to adjust to the machine and its daily use she became highly compliant at 100% she has used the machine  97% of the time for over 4 hours at night. However in early January 2016 the machine suddenly stopped providing air in the midst night she passed for air woke up and found a displayed message that the toe was blocked or Holmes was blocked. There was no kinking of the tube and no obvious dislodgment of any parts and 2 similar messages or sudden failure to provide air has happened frequently since. She has spoken with the respiratory therapist at a [facility] her durable medical equipment company. She stated that the therapist plaque the machine and stated that it works fine. She the patient is reasonably frustrated, first of all because her machine  seems to not measures a correct time of use and can be off by as much as 2 hours underestimating her compliance time and in addition because she is now apprehensive of using the machine afraid that she will wake up air hungry. As I quoted her compliance date 4 1-20 7-16 shows an AHI of 0.488 cm water with 3 cm EPR and 6 hours and 59 minutes of daily use the patient is highly compliant and fulfills her insurance criteria but she has used the machine extra hours to get to these time measurements the machine itself seems to cut her short. I will inquire with apnea if the patient's machine can be exchanged for a new one if they think that the machine is likely either erroneously programmed or has a another electronic defect. Its air pressure  function is not in question.  Her fatigue has increased, I will order an ONO while on CPAP.     Review of Systems: Out of a complete 14 system review, the patient complains of only the following symptoms, and all other reviewed systems are negative.  Epworth 12, GDS 9, FSS 42 .    History   Social History  . Marital Status: Married    Spouse Name: Julie Owen    Number of Children: 0  . Years of Education: Master's   Occupational History  . Not on file.   Social History Main Topics  . Smoking status: Former Smoker    Types: Cigarettes    Start date: 12/11/1965    Quit date: 07/30/1975  . Smokeless tobacco: Never Used  . Alcohol Use: 1.8 oz/week    3 Glasses of wine per week     Comment: occas.  . Drug Use: No  . Sexual Activity: Not on file   Other Topics Concern  . Not on file   Social History Narrative   Patient is married Julie Owen) and lives at home with her husband.   Patient is a retired Pharmacist, hospital.   Patient has a Master's degree.   Patient is right-handed.   Patient does not drink any caffeine.    Family History  Problem Relation Age of Onset  . Hypertension Other   . Kidney disease Other   . Stroke Other   . Heart attack Other    . Cancer Other   . Obesity Other   . Hyperlipidemia Mother   . Hypertension Mother   . Heart disease Mother   . Macular degeneration Father   . Cancer Sister     colon  . Hypertension Sister     Past Medical History  Diagnosis Date  . Hyperlipidemia   . Prediabetes   . Hypertension   . Obesity   . Osteoarthritis of lumbar spine   . Osteoarthritis of thumb     bilateral  . Atrial  septal defect   . Hip pain, right   . Diabetes mellitus without complication     Past Surgical History  Procedure Laterality Date  . Total hip arthroplasty  08/2010    Right hip  . Breast surgery  2008    Right breast  . Bunionectomy  1993    Right foot  . Breast reduction surgery    . Tonsillectomy    . Eyelid surgery      Current Outpatient Prescriptions  Medication Sig Dispense Refill  . Alpha-Lipoic Acid 600 MG CAPS Take 600 mg by mouth at bedtime.    . Cholecalciferol (VITAMIN D3) 2000 UNITS TABS Take 1 tablet by mouth daily.    . Coenzyme Q10 (CO Q 10 PO) Take 300 mg by mouth daily.    Marland Kitchen ibuprofen (ADVIL,MOTRIN) 200 MG tablet Take 400 mg by mouth every 6 (six) hours as needed.    Marland Kitchen losartan (COZAAR) 100 MG tablet Take 50 mg by mouth daily.     . metFORMIN (GLUCOPHAGE-XR) 500 MG 24 hr tablet Take 500 mg by mouth at bedtime.     . Misc. Devices MISC CPAP    . NORITATE 1 % cream Apply topically daily.     . Omega 3 1000 MG CAPS Take 1,000 mg by mouth daily.    . pravastatin (PRAVACHOL) 20 MG tablet Take 20 mg by mouth.    . terconazole (TERAZOL 7) 0.4 % vaginal cream Place 1 applicator vaginally at bedtime as needed.      No current facility-administered medications for this visit.    Allergies as of 06/10/2014 - Review Complete 06/10/2014  Allergen Reaction Noted  . Aspirin Other (See Comments)   . Benzocaine  06/26/2011  . Caffeine  06/26/2011  . Codeine Other (See Comments)   . Lidocaine-epinephrine Other (See Comments) 11/04/2013  . Oxycodone-acetaminophen Other (See  Comments) 11/04/2013  . Percocet [oxycodone-acetaminophen]  06/26/2011  . Procaine hcl  06/26/2011  . Pseudoephedrine hcl er  06/26/2011  . Simvastatin      Vitals: There were no vitals taken for this visit. Last Weight:  Wt Readings from Last 1 Encounters:  05/12/14 189 lb (85.73 kg)   Last Height:   Ht Readings from Last 1 Encounters:  05/12/14 5\' 6"  (1.676 m)    Physical exam:  General: The patient is awake, alert and appears not in acute distress. The patient is well groomed. Head: Normocephalic, atraumatic. Neck is supple. Mallampati 4, neck circumference:16 inches. Retrognathia.  Cardiovascular:  Regular rate and rhythm, without  murmurs or carotid bruit, and without distended neck veins. Respiratory: Lungs are clear to auscultation. Skin:  Without evidence of edema, or rash Trunk: BMI is elevated and patient  has normal posture.  Neurologic exam : The patient is awake and alert, oriented to place and time.  Memory subjective described as intact.  There is a normal attention span & concentration ability. Speech is fluent without  dysarthria, dysphonia or aphasia.  Mood and affect are appropriate.  Cranial nerves: Pupils are equal and briskly reactive to light. Funduscopic exam without  evidence of pallor or edema.  Extraocular movements  in vertical and horizontal planes intact and without nystagmus. Visual fields by finger perimetry are intact. Hearing to finger rub intact.  Facial sensation intact to fine touch. Facial motor strength is symmetric and tongue and uvula move midline.  Assessment:  After physical and neurologic examination, review of laboratory studies, imaging, neurophysiology testing and pre-existing records, assessment is  1) OSA , mild with hypoxemia , now on CPAP . Highly compliant , therapy started on 04-19-14   Her fatigue has increased, I will order an ONO while on CPAP.  Her machine is malfunctioning. She showed me the data of use, these vary by  more than 2 hours on some days from the  Display. Her machine has switched off in the middle of the night.  Nasal congestion -    Plan:  [facility] to exchange the machine, ASAP. RV in 3 month with NP for follow up.  The patient still has some nasal congestion which may contribute to her difficulties breathing at night at least on occasion. I recommend a saltwater nasal spray to just literally flush the nostril. I have generated an electronic order to apnea to make sure that the patients machine will be exchanged for another one ASAP. We do not have to change the mask,  Interface,  Tubing or  filter at this time.

## 2014-08-15 ENCOUNTER — Encounter: Payer: Self-pay | Admitting: Neurology

## 2014-08-15 ENCOUNTER — Ambulatory Visit (INDEPENDENT_AMBULATORY_CARE_PROVIDER_SITE_OTHER): Payer: Commercial Managed Care - HMO | Admitting: Neurology

## 2014-08-15 VITALS — BP 123/67 | HR 64 | Resp 12 | Wt 190.8 lb

## 2014-08-15 DIAGNOSIS — Z9989 Dependence on other enabling machines and devices: Principal | ICD-10-CM

## 2014-08-15 DIAGNOSIS — G4733 Obstructive sleep apnea (adult) (pediatric): Secondary | ICD-10-CM

## 2014-08-15 NOTE — Progress Notes (Signed)
[facility] SLEEP MEDICINE CONSULTATION   Provider:  Larey Seat, M D  Referring Provider: Leanna Battles, MD Primary Care Physician:  Donnajean Lopes, MD  Chief Complaint  Patient presents with  . RV cpap / results    Rm 10, alone    HPI:  Julie Owen is a 69 y.o. female, who is seen here as a referral from Dr. Philip Aspen for an insomnia and snoring evaluation, ruling out apnea.    The patient is a retired Pharmacist, hospital, and still has some engrained sleep habits, such as rising at 5 AM as she did for over 40 years. She is not sleepy but fatigued, she exclaimed. She had related the changes in her sleep pattern to her menopause. She developed palpitations from caffeine in peri menopause, felt panicked and anxious. She used" to sleep like a baby", but at age 66 she found herself in completed  Menopause. She could finally relate hot flushes and mood changes, tearfulness. Her sleep never recovered.  She gained weight and begun snoring. Her sleep has become less and less deep, restorative.   The patient reports she usually goes to bed around 9 PM watches TV in the bedroom. About one hour later she will go to sleep, but wakes up 2-3 times to go to the bathroom. If she turns over she will wake up, too. She has sometimes trouble to go back to sleep.  She always wakes up at 2.30 and she finds no further restful sleep after that, rises at 5.30, spontaneously, no alarm needed.  She keeps a glass of water right beside her bed because she often has a dry mouth when waking up. She does not report any nocturnal headaches usually. The overall nocturnal sleep time is between 4-5 hours only, she takes  One hour nap in the afternoon, about 2-3 PM, and on her sofa. Her husband sleeps not in the same room, she snores and has woken herself up form snoring. Her dog sleeps nearby, but never wakes her. She has owned a dog before that needed physically to be brought outside to urinate , 7-8 times at  night. This dog died about 2 years ago.  She doesn't take any caffeine anymore. The sleep pattern may have changed than.   Family history - one cousin with OSA , non compliant  CPAP . Reviewed medication and alLergies,  HISTORY    06-10-14 Julie Owen is here today for her first visit after sleep study and CPAP titration. The patient underwent a polysomnography on 12-17-13 which revealed a mild apnea with an AHI of 10.5 and an RDI of 14.6 per hour but associated with oxygen desaturations. In the setting of snoring and periodic limb movements a CPAP titration was not performed the same night as the patient did not have enough sleep time at line. She returned on 02-18-14 for the CPAP titration her AHI was reduced to 1.4 periodic limb movements temple rarely increased and she did best at 8 cm water pressure under which she slept 492 minutes. She also experienced a REM rebound of 45 minutes the setting of 8 cm water pressure allowed for an AHI of 0.9 the patient could sleep on her right side and tolerated a nasal pillow very well. I am meeting today for compliance visit and Julie Owen has told me that she started with CPAP treatment on December 8 and for the first month did exceedingly well she had a very low threshold to adjust to the machine and its  daily use she became highly compliant at 100% she has used the machine 97% of the time for over 4 hours at night. However in early January 2016 the machine suddenly stopped providing air in the midst night she passed for air woke up and found a displayed message that the toe was blocked or Holmes was blocked. There was no kinking of the tube and no obvious dislodgment of any parts and 2 similar messages or sudden failure to provide air has happened frequently since. She has spoken with the respiratory therapist at a [facility] her durable medical equipment company. She stated that the therapist plaque the machine and stated that it works fine. She the patient is reasonably  frustrated, first of all because her machine seems to not measures a correct time of use and can be off by as much as 2 hours underestimating her compliance time and in addition because she is now apprehensive of using the machine afraid that she will wake up air hungry. As I quoted her compliance date 4 1-20 7-16 shows an AHI of 0.488 cm water with 3 cm EPR and 6 hours and 59 minutes of daily use the patient is highly compliant and fulfills her insurance criteria but she has used the machine extra hours to get to these time measurements the machine itself seems to cut her short. I will inquire with apnea if the patient's machine can be exchanged for a new one if they think that the machine is likely either erroneously programmed or has a another electronic defect. Its air pressure  function is not in question.Her fatigue has increased, I will order an ONO while on CPAP.  Her machine is malfunctioning. She showed me the data of use, these vary by more than 2 hours on some days from the  Display. Her machine has switched off in the middle of the night.  Her fatigue has increased, I will order an ONO while on CPAP.   PLAN : [facility] to exchange the machine, ASAP. RV in 3 month with NP for follow up.  The patient still has some nasal congestion which may contribute to her difficulties breathing at night at least on occasion. I recommend a saltwater nasal spray to just literally flush the nostril. I have generated an electronic order to apnea to make sure that the patients machine will be exchanged for another one ASAP. We do not have to change the mask, Interface, Tubing or filter at this time.   Interval history from 08-15-14  Julie Owen has no the benefit of a new CPAP machine. She had undergone an overnight pulse oximetry by using CPAP. But had downloaded data spoke for an very well controlled sleep apnea with an residual AHI file below 5 her fatigue severity was very high and she was excessively daytime sleepy  as well.  It turned out that she remained hypoxemic and that a new machine was needed to control her sleep apnea as well as her oxygen levels. Her durable medical equipment company, namely [employer], exchanged her old not longer working machine for a new model which now is doing well. Her fatigue severity score today for further is 19 and her Epworth sleepiness score is 4 points. I was also able to get a new date download. The patient had been diagnosed with sleep apnea seems to do well on the current setting of 8 cm water. She continues to use an Eason nasal mask in medium size. Heated humidification his knee is used. The  AHI on 06-10-14 was 0.4 , at  a 97% compliance. The patient reports problems with [facility], being billed several hundred USD a month for a rental machine- and having not filed for insurance coverage ? .   A broken machine needs to be replaced and is a insurance covered expense.   Review of Systems: Out of a complete 14 system review, the patient complains of only the following symptoms, and all other reviewed systems are negative.  Epworth  4 from 12, GDS 9, FSS  19 from 42 .GOOD RESOLUTION on the new machine .     History   Social History  . Marital Status: Married    Spouse Name: Gwyndolyn Saxon  . Number of Children: 0  . Years of Education: Master's   Occupational History  . Not on file.   Social History Main Topics  . Smoking status: Former Smoker    Types: Cigarettes    Start date: 12/11/1965    Quit date: 07/30/1975  . Smokeless tobacco: Never Used  . Alcohol Use: 1.8 oz/week    3 Glasses of wine per week     Comment: occas.  . Drug Use: No  . Sexual Activity: Not on file   Other Topics Concern  . Not on file   Social History Narrative   Patient is married Gwyndolyn Saxon) and lives at home with her husband.   Patient is a retired Pharmacist, hospital.   Patient has a Master's degree.   Patient is right-handed.   Patient does not drink any caffeine.    Family History  Problem  Relation Age of Onset  . Hypertension Other   . Kidney disease Other   . Stroke Other   . Heart attack Other   . Cancer Other   . Obesity Other   . Hyperlipidemia Mother   . Hypertension Mother   . Heart disease Mother   . Macular degeneration Father   . Cancer Sister     colon  . Hypertension Sister     Past Medical History  Diagnosis Date  . Hyperlipidemia   . Prediabetes   . Hypertension   . Obesity   . Osteoarthritis of lumbar spine   . Osteoarthritis of thumb     bilateral  . Atrial septal defect   . Hip pain, right   . Diabetes mellitus without complication     Past Surgical History  Procedure Laterality Date  . Total hip arthroplasty  08/2010    Right hip  . Breast surgery  2008    Right breast  . Bunionectomy  1993    Right foot  . Breast reduction surgery    . Tonsillectomy    . Eyelid surgery      Current Outpatient Prescriptions  Medication Sig Dispense Refill  . Alpha-Lipoic Acid 600 MG CAPS Take 600 mg by mouth at bedtime.    . Cholecalciferol (VITAMIN D3) 2000 UNITS TABS Take 1 tablet by mouth daily.    . Coenzyme Q10 (CO Q 10 PO) Take 300 mg by mouth daily.    Marland Kitchen ibuprofen (ADVIL,MOTRIN) 200 MG tablet Take 400 mg by mouth every 6 (six) hours as needed.    Marland Kitchen losartan (COZAAR) 100 MG tablet Take 50 mg by mouth daily.     . metFORMIN (GLUCOPHAGE-XR) 500 MG 24 hr tablet Take 500 mg by mouth at bedtime.     . Misc. Devices MISC CPAP    . NORITATE 1 % cream Apply topically daily.     Marland Kitchen  Omega 3 1000 MG CAPS Take 1,000 mg by mouth daily.    . pravastatin (PRAVACHOL) 20 MG tablet Take 20 mg by mouth daily.     Marland Kitchen terconazole (TERAZOL 7) 0.4 % vaginal cream Place 1 applicator vaginally at bedtime as needed.      No current facility-administered medications for this visit.    Allergies as of 08/15/2014 - Review Complete 08/15/2014  Allergen Reaction Noted  . Aspirin Other (See Comments)   . Benzocaine  06/26/2011  . Caffeine  06/26/2011  . Codeine  Other (See Comments)   . Lidocaine-epinephrine Other (See Comments) 11/04/2013  . Oxycodone-acetaminophen Other (See Comments) 11/04/2013  . Percocet [oxycodone-acetaminophen]  06/26/2011  . Procaine hcl  06/26/2011  . Pseudoephedrine hcl er  06/26/2011  . Simvastatin      Vitals: BP 123/67 mmHg  Pulse 64  Resp 12  Wt 190 lb 12.8 oz (86.546 kg) Last Weight:  Wt Readings from Last 1 Encounters:  08/15/14 190 lb 12.8 oz (86.546 kg)   Last Height:   Ht Readings from Last 1 Encounters:  06/10/14 5\' 6"  (1.676 m)    Physical exam:  General: The patient is awake, alert and appears not in acute distress. The patient is well groomed. Head: Normocephalic, atraumatic. Neck is supple. Mallampati 4, neck circumference:16 inches. Retrognathia.  Cardiovascular:  Regular rate and rhythm, without  murmurs or carotid bruit, and without distended neck veins. Respiratory: Lungs are clear to auscultation. Skin:  Without evidence of edema, or rash Trunk: BMI is elevated and patient  has normal posture.  Neurologic exam : The patient is awake and alert, oriented to place and time.   Memory subjective described as intact.  There is a normal attention span & concentration ability. Speech is fluent without  dysarthria, dysphonia or aphasia.  Mood and affect are appropriate.  Cranial nerves: Pupils are equal and briskly reactive to light. Funduscopic exam without  evidence of pallor or edema.  Extraocular movements  in vertical and horizontal planes intact and without nystagmus. Visual fields by finger perimetry are intact. Hearing to finger rub intact.  Facial sensation intact to fine touch. Facial motor strength is symmetric and tongue and uvula move midline.  Assessment:  After physical and neurologic examination, review of laboratory studies, imaging, neurophysiology testing and pre-existing records, assessment is   1) OSA , mild with hypoxemia , now on CPAP . Highly compliant , therapy started  on 04-19-14   Therapy did not reduce her fatigue or sleepiness . Repeated test as the patient had a non fucntioning machine.  New machine works well, Epworth and FSS now reduced.   Download of the current CPAP machine shows that the patient has 100% compliance for 30 out of 30 days of use and 30 days over 4 hours of daily use. The average time in CPAP therapy at night 7 hours and 48 minutes, the CPAP is still set at 8 cm 27 m EPR, the residual AHI is 0.8. There is no high air leak noted. I would like to review Julie Owen pulse oximetry but serum since this was done on her new machine, it will probably not show any residual hypoxemia. She responded clinically so very well but I don't assume that hypoxemia is present.    Plan:

## 2014-08-15 NOTE — Patient Instructions (Signed)
Sleep Apnea  Sleep apnea is a sleep disorder characterized by abnormal pauses in breathing while you sleep. When your breathing pauses, the level of oxygen in your blood decreases. This causes you to move out of deep sleep and into light sleep. As a result, your quality of sleep is poor, and the system that carries your blood throughout your body (cardiovascular system) experiences stress. If sleep apnea remains untreated, the following conditions can develop:  High blood pressure (hypertension).  Coronary artery disease.  Inability to achieve or maintain an erection (impotence).  Impairment of your thought process (cognitive dysfunction). There are three types of sleep apnea: 1. Obstructive sleep apnea--Pauses in breathing during sleep because of a blocked airway. 2. Central sleep apnea--Pauses in breathing during sleep because the area of the brain that controls your breathing does not send the correct signals to the muscles that control breathing. 3. Mixed sleep apnea--A combination of both obstructive and central sleep apnea. RISK FACTORS The following risk factors can increase your risk of developing sleep apnea:  Being overweight.  Smoking.  Having narrow passages in your nose and throat.  Being of older age.  Being female.  Alcohol use.  Sedative and tranquilizer use.  Ethnicity. Among individuals younger than 35 years, African Americans are at increased risk of sleep apnea. SYMPTOMS   Difficulty staying asleep.  Daytime sleepiness and fatigue.  Loss of energy.  Irritability.  Loud, heavy snoring.  Morning headaches.  Trouble concentrating.  Forgetfulness.  Decreased interest in sex. DIAGNOSIS  In order to diagnose sleep apnea, your caregiver will perform a physical examination. Your caregiver may suggest that you take a home sleep test. Your caregiver may also recommend that you spend the night in a sleep lab. In the sleep lab, several monitors record  information about your heart, lungs, and brain while you sleep. Your leg and arm movements and blood oxygen level are also recorded. TREATMENT The following actions may help to resolve mild sleep apnea:  Sleeping on your side.   Using a decongestant if you have nasal congestion.   Avoiding the use of depressants, including alcohol, sedatives, and narcotics.   Losing weight and modifying your diet if you are overweight. There also are devices and treatments to help open your airway:  Oral appliances. These are custom-made mouthpieces that shift your lower jaw forward and slightly open your bite. This opens your airway.  Devices that create positive airway pressure. This positive pressure "splints" your airway open to help you breathe better during sleep. The following devices create positive airway pressure:  Continuous positive airway pressure (CPAP) device. The CPAP device creates a continuous level of air pressure with an air pump. The air is delivered to your airway through a mask while you sleep. This continuous pressure keeps your airway open.  Nasal expiratory positive airway pressure (EPAP) device. The EPAP device creates positive air pressure as you exhale. The device consists of single-use valves, which are inserted into each nostril and held in place by adhesive. The valves create very little resistance when you inhale but create much more resistance when you exhale. That increased resistance creates the positive airway pressure. This positive pressure while you exhale keeps your airway open, making it easier to breath when you inhale again.  Bilevel positive airway pressure (BPAP) device. The BPAP device is used mainly in patients with central sleep apnea. This device is similar to the CPAP device because it also uses an air pump to deliver continuous air pressure   through a mask. However, with the BPAP machine, the pressure is set at two different levels. The pressure when you  exhale is lower than the pressure when you inhale.  Surgery. Typically, surgery is only done if you cannot comply with less invasive treatments or if the less invasive treatments do not improve your condition. Surgery involves removing excess tissue in your airway to create a wider passage way. Document Released: 04/19/2002 Document Revised: 08/24/2012 Document Reviewed: 09/05/2011 Exit[employer] Patient Information 2015 [employer]. This information is not intended to replace advice given to you by your health care provider. Make sure you discuss any questions you have with your health care provider.  

## 2014-09-02 ENCOUNTER — Encounter: Payer: Self-pay | Admitting: Neurology

## 2014-09-09 ENCOUNTER — Ambulatory Visit: Payer: Commercial Managed Care - HMO | Admitting: Neurology

## 2014-09-13 ENCOUNTER — Ambulatory Visit: Payer: Commercial Managed Care - HMO | Admitting: Neurology

## 2015-05-09 ENCOUNTER — Ambulatory Visit (INDEPENDENT_AMBULATORY_CARE_PROVIDER_SITE_OTHER): Payer: Commercial Managed Care - HMO | Admitting: Cardiovascular Disease

## 2015-05-09 ENCOUNTER — Encounter: Payer: Self-pay | Admitting: Cardiovascular Disease

## 2015-05-09 VITALS — BP 130/72 | HR 68 | Ht 65.0 in | Wt 198.0 lb

## 2015-05-09 DIAGNOSIS — E785 Hyperlipidemia, unspecified: Secondary | ICD-10-CM

## 2015-05-09 DIAGNOSIS — Q211 Atrial septal defect, unspecified: Secondary | ICD-10-CM

## 2015-05-09 DIAGNOSIS — I10 Essential (primary) hypertension: Secondary | ICD-10-CM

## 2015-05-09 DIAGNOSIS — I779 Disorder of arteries and arterioles, unspecified: Secondary | ICD-10-CM

## 2015-05-09 DIAGNOSIS — I739 Peripheral vascular disease, unspecified: Secondary | ICD-10-CM

## 2015-05-09 NOTE — Patient Instructions (Signed)
Continue all current medications. Your physician wants you to follow up in:  1 year.  You will receive a reminder letter in the mail one-two months in advance.  If you don't receive a letter, please call our office to schedule the follow up appointment   

## 2015-05-09 NOTE — Progress Notes (Signed)
Patient ID: DEPRISE NOVELO, female   DOB: [birth year], 69 y.o.   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 There were plans for percutaneous closure by a Dr. Dwain Sarna in June/July 2006. However, this was not seen by her most recent echo with bubble study in 2013. She refused to undergo a cardiac MRI due to fears of dye allergy. She also has hypertension, hyperlipidemia, mild sleep apnea, and carotid artery disease.  The patient denies any symptoms of chest pain, palpitations, shortness of breath, leg swelling, orthopnea, PND, and syncope.  She has been battling sinus and ear infections for the past month.  ECG performed in the office today demonstrates normal sinus rhythm with no ischemic ST segment or T-wave abnormalities, nor any arrhythmias.   Review of Systems: As per "subjective", otherwise negative.  Allergies  Allergen Reactions  . Aspirin Other (See Comments)  . Benzocaine   . Caffeine   . Codeine Other (See Comments)  . Lidocaine-Epinephrine Other (See Comments)  . Oxycodone-Acetaminophen Other (See Comments)  . Percocet [Oxycodone-Acetaminophen]   . Procaine Hcl   . Pseudoephedrine Hcl Er   . Simvastatin     REACTION: hair loss    Current Outpatient Prescriptions  Medication Sig Dispense Refill  . pravastatin (PRAVACHOL) 20 MG tablet Take 20 mg by mouth. Takes 3 times a week    . Alpha-Lipoic Acid 600 MG CAPS Take 600 mg by mouth at bedtime.    . Cholecalciferol (VITAMIN D3) 2000 UNITS TABS Take 1 tablet by mouth daily.    . Coenzyme Q10 (CO Q 10 PO) Take 300 mg by mouth daily.    Marland Kitchen ibuprofen (ADVIL,MOTRIN) 200 MG tablet Take 400 mg by mouth every 6 (six) hours as needed.    Marland Kitchen losartan (COZAAR) 100 MG tablet Take 50 mg by mouth daily.     . metFORMIN (GLUCOPHAGE-XR) 500 MG 24 hr tablet Take 500 mg by mouth at bedtime.     . Misc. Devices MISC CPAP    . NORITATE 1 % cream Apply  topically daily.     . Omega 3 1000 MG CAPS Take 1,000 mg by mouth daily.    Marland Kitchen terconazole (TERAZOL 7) 0.4 % vaginal cream Place 1 applicator vaginally at bedtime as needed.      No current facility-administered medications for this visit.    Past Medical History  Diagnosis Date  . Hyperlipidemia   . Prediabetes   . Hypertension   . Obesity   . Osteoarthritis of lumbar spine   . Osteoarthritis of thumb     bilateral  . Atrial septal defect   . Hip pain, right   . Diabetes mellitus without complication [facility])     Past Surgical History  Procedure Laterality Date  . Total hip arthroplasty  08/2010    Right hip  . Breast surgery  2008    Right breast  . Bunionectomy  1993    Right foot  . Breast reduction surgery    . Tonsillectomy    . Eyelid surgery      Social History   Social History  . Marital Status: Married    Spouse Name: Gwyndolyn Saxon  . Number of Children: 0  . Years of Education: Master's   Occupational History  . Not on file.   Social History Main Topics  . Smoking status: Former Smoker    Types: Cigarettes  Start date: 12/11/1965    Quit date: 07/30/1975  . Smokeless tobacco: Never Used  . Alcohol Use: 1.8 oz/week    3 Glasses of wine per week     Comment: occas.  . Drug Use: No  . Sexual Activity: Not on file   Other Topics Concern  . Not on file   Social History Narrative   Patient is married Gwyndolyn Saxon) and lives at home with her husband.   Patient is a retired Pharmacist, hospital.   Patient has a Master's degree.   Patient is right-handed.   Patient does not drink any caffeine.     Filed Vitals:   05/09/15 1307  BP: 130/72  Pulse: 68  Height: 5\' 5"  (1.651 m)  Weight: 198 lb (89.812 kg)  SpO2: 98%    PHYSICAL EXAM General: NAD HEENT: Normal. Neck: No JVD, no thyromegaly. Lungs: Clear to auscultation bilaterally with normal respiratory effort. CV: Nondisplaced PMI.  Regular rate and rhythm, normal S1/S2, no [id number], soft 1/6 systolic murmur  heard throughout precordium. No pretibial or periankle edema.  No carotid bruit. Abdomen: Soft, nontender, no distention.  Neurologic: Alert and oriented x 3.  Psych: Normal affect. Skin: Normal. Musculoskeletal: No gross deformities. Extremities: No clubbing or cyanosis.   ECG: Most recent ECG reviewed.      ASSESSMENT AND PLAN: 1. Atrial septal defect/low secundum ASD: She is asymptomatic.This was not seen by her most recent echo with bubble study in 2013, although the patient previously told me that the technician did note some bubbles crossing over, albeit not a large amount. This was not documented on the official report. This diagnosis has impacted her life since 1980, in particular her finances with respect to repeated procedures and her life insurance premiums. In order to definitively answer this question, I previously ordered a cardiac MRI but the patient was unwilling to proceed. I informed her that I would make sure she is adequately premedicated with steroids and antihistamines prior to undergoing a cardiac MRI should she decide to proceed with it. I also previously discussed the option of right heart catheterization with a saturation run for confirmatory purposes. Will continue with observation.  2. Essential HTN: Well controlled on current therapy, Cozaar 50 mg daily.   3. Hyperlipidemia: She takes pravastatin three times per week.  4. Bilateral carotid artery disease: Documented as mild. I do not appreciate any bruits. I would consider Dopplers in the future.  5. Sleep apnea: On CPAP.   Dispo: f/u 1 year.   Kate Sable, M.D., F.A.C.C.

## 2015-05-11 ENCOUNTER — Encounter (HOSPITAL_COMMUNITY): Payer: Self-pay | Admitting: *Deleted

## 2015-05-11 ENCOUNTER — Emergency Department (HOSPITAL_COMMUNITY)
Admission: EM | Admit: 2015-05-11 | Discharge: 2015-05-12 | Disposition: A | Discharge status: Home / Self Care | Payer: Commercial Managed Care - HMO | Attending: Emergency Medicine | Admitting: Emergency Medicine

## 2015-05-11 ENCOUNTER — Emergency Department (HOSPITAL_COMMUNITY): Payer: Commercial Managed Care - HMO

## 2015-05-11 DIAGNOSIS — I10 Essential (primary) hypertension: Secondary | ICD-10-CM | POA: Insufficient documentation

## 2015-05-11 DIAGNOSIS — M47816 Spondylosis without myelopathy or radiculopathy, lumbar region: Secondary | ICD-10-CM | POA: Insufficient documentation

## 2015-05-11 DIAGNOSIS — E669 Obesity, unspecified: Secondary | ICD-10-CM | POA: Diagnosis not present

## 2015-05-11 DIAGNOSIS — E119 Type 2 diabetes mellitus without complications: Secondary | ICD-10-CM | POA: Insufficient documentation

## 2015-05-11 DIAGNOSIS — M19049 Primary osteoarthritis, unspecified hand: Secondary | ICD-10-CM | POA: Insufficient documentation

## 2015-05-11 DIAGNOSIS — E785 Hyperlipidemia, unspecified: Secondary | ICD-10-CM | POA: Diagnosis not present

## 2015-05-11 DIAGNOSIS — Q211 Atrial septal defect: Secondary | ICD-10-CM | POA: Insufficient documentation

## 2015-05-11 DIAGNOSIS — Z7984 Long term (current) use of oral hypoglycemic drugs: Secondary | ICD-10-CM | POA: Diagnosis not present

## 2015-05-11 DIAGNOSIS — Z87442 Personal history of urinary calculi: Secondary | ICD-10-CM | POA: Insufficient documentation

## 2015-05-11 DIAGNOSIS — Z79899 Other long term (current) drug therapy: Secondary | ICD-10-CM | POA: Diagnosis not present

## 2015-05-11 DIAGNOSIS — N23 Unspecified renal colic: Secondary | ICD-10-CM | POA: Diagnosis not present

## 2015-05-11 DIAGNOSIS — Z87891 Personal history of nicotine dependence: Secondary | ICD-10-CM | POA: Insufficient documentation

## 2015-05-11 DIAGNOSIS — R1032 Left lower quadrant pain: Secondary | ICD-10-CM | POA: Diagnosis present

## 2015-05-11 HISTORY — DX: Disorder of kidney and ureter, unspecified: N28.9

## 2015-05-11 LAB — URINALYSIS, ROUTINE W REFLEX MICROSCOPIC
Bilirubin Urine: NEGATIVE
GLUCOSE, UA: NEGATIVE mg/dL
Ketones, ur: 15 mg/dL — AB
Leukocytes, UA: NEGATIVE
Nitrite: NEGATIVE
PH: 5.5 (ref 5.0–8.0)

## 2015-05-11 LAB — URINE MICROSCOPIC-ADD ON

## 2015-05-11 LAB — CBC
HCT: 43.1 % (ref 36.0–46.0)
HEMOGLOBIN: 14.4 g/dL (ref 12.0–15.0)
MCH: 30.3 pg (ref 26.0–34.0)
MCHC: 33.4 g/dL (ref 30.0–36.0)
MCV: 90.5 fL (ref 78.0–100.0)
Platelets: 197 10*3/uL (ref 150–400)
RBC: 4.76 MIL/uL (ref 3.87–5.11)
RDW: 13.5 % (ref 11.5–15.5)
WBC: 12.7 10*3/uL — ABNORMAL HIGH (ref 4.0–10.5)

## 2015-05-11 MED ORDER — FENTANYL CITRATE (PF) 100 MCG/2ML IJ SOLN
50.0000 ug | Freq: Once | INTRAMUSCULAR | Status: AC
Start: 2015-05-11 — End: 2015-05-11
  Administered 2015-05-11: 50 ug via INTRAVENOUS
  Filled 2015-05-11: qty 2

## 2015-05-11 MED ORDER — ONDANSETRON HCL 4 MG/2ML IJ SOLN
4.0000 mg | Freq: Once | INTRAMUSCULAR | Status: AC
  Administered 2015-05-11: 4 mg via INTRAVENOUS
  Filled 2015-05-11: qty 2

## 2015-05-11 NOTE — ED Notes (Signed)
Pt c/o lower back pain, left sided flank pain that radiates around to her lower abdomen, and n/v. Pt c/o severe flank pain since 5:30 pm.

## 2015-05-11 NOTE — ED Notes (Signed)
Patient ambulatory to restroom to collect urine sample 

## 2015-05-11 NOTE — ED Provider Notes (Signed)
CSN: [id number]     Arrival date & time 05/11/15  2031 History   By signing my name below, I, Forrestine Him, attest that this documentation has been prepared under the direction and in the presence of Orpah Greek, MD.  Electronically Signed: Forrestine Him, ED Scribe. 05/11/2015. 11:38 PM.   Chief Complaint  Patient presents with  . Flank Pain   The history is provided by the patient. No language interpreter was used.    HPI Comments: Julie Owen is a 69 y.o. female with a PNHx of hyperlipidemia, HTN, DM, and kidney stones who presents to the Emergency Department complaining of constant, ongoing L sided flank pain that radiates to the lower abdomen onset 5:30 PM this evening. She also reports ongoing, intermittent back pain x 5 days. No aggravating or alleviating factors at this time. No OTC medications or home remedies attempted prior to arrival. Last kidney stone 2 years ago.  PCP: Donnajean Lopes, MD    Past Medical History  Diagnosis Date  . Hyperlipidemia   . Prediabetes   . Hypertension   . Obesity   . Osteoarthritis of lumbar spine   . Osteoarthritis of thumb     bilateral  . Atrial septal defect   . Hip pain, right   . Diabetes mellitus without complication ([place])   . Renal disorder     kidney stones   Past Surgical History  Procedure Laterality Date  . Total hip arthroplasty  08/2010    Right hip  . Breast surgery  2008    Right breast  . Bunionectomy  1993    Right foot  . Breast reduction surgery    . Tonsillectomy    . Eyelid surgery     Family History  Problem Relation Age of Onset  . Hypertension Other   . Kidney disease Other   . Stroke Other   . Heart attack Other   . Cancer Other   . Obesity Other   . Hyperlipidemia Mother   . Hypertension Mother   . Heart disease Mother   . Macular degeneration Father   . Cancer Sister     colon  . Hypertension Sister    Social History  Substance Use Topics  . Smoking status: Former Smoker     Types: Cigarettes    Start date: 12/11/1965    Quit date: 07/30/1975  . Smokeless tobacco: Never Used  . Alcohol Use: 1.8 oz/week    3 Glasses of wine per week     Comment: occas.   OB History    No data available     Review of Systems  Constitutional: Negative for fever and chills.  HENT: Negative for congestion.   Respiratory: Negative for shortness of breath.   Cardiovascular: Negative for chest pain.  Gastrointestinal: Negative for nausea, vomiting and abdominal pain.  Genitourinary: Positive for flank pain.  Musculoskeletal: Positive for back pain.  Skin: Negative for rash.  Psychiatric/Behavioral: Negative for confusion.  All other systems reviewed and are negative.     Allergies  Aspirin; Benzocaine; Caffeine; Codeine; Lidocaine-epinephrine; Oxycodone-acetaminophen; Percocet; Procaine hcl; Pseudoephedrine hcl er; and Simvastatin  Home Medications   Prior to Admission medications   Medication Sig Start Date End Date Taking? Authorizing Provider  Alpha-Lipoic Acid 600 MG CAPS Take 600 mg by mouth at bedtime.    Historical Provider, MD  Cholecalciferol (VITAMIN D3) 2000 UNITS TABS Take 1 tablet by mouth daily.    Historical Provider, MD  Coenzyme Q10 (CO  Q 10 PO) Take 300 mg by mouth daily.    Historical Provider, MD  ibuprofen (ADVIL,MOTRIN) 200 MG tablet Take 400 mg by mouth every 6 (six) hours as needed.    Historical Provider, MD  losartan (COZAAR) 100 MG tablet Take 50 mg by mouth daily.     Historical Provider, MD  metFORMIN (GLUCOPHAGE-XR) 500 MG 24 hr tablet Take 500 mg by mouth at bedtime.     Historical Provider, MD  Misc. Devices MISC CPAP    Historical Provider, MD  NORITATE 1 % cream Apply topically daily.  10/14/11   Historical Provider, MD  Omega 3 1000 MG CAPS Take 1,000 mg by mouth daily.    Historical Provider, MD  pravastatin (PRAVACHOL) 20 MG tablet Take 20 mg by mouth. Takes 3 times a week    Historical Provider, MD  terconazole (TERAZOL 7) 0.4 %  vaginal cream Place 1 applicator vaginally at bedtime as needed.  10/14/11   Historical Provider, MD   Triage Vitals: BP 192/68 mmHg  Pulse 59  Temp(Src) 97.5 F (36.4 C) (Oral)  Resp 20  Ht 5\' 5"  (1.651 m)  Wt 195 lb (88.451 kg)  BMI 32.45 kg/m2  SpO2 97%   Physical Exam  Constitutional: She is oriented to person, place, and time. She appears well-developed and well-nourished. No distress.  HENT:  Head: Normocephalic and atraumatic.  Right Ear: Hearing normal.  Left Ear: Hearing normal.  Nose: Nose normal.  Mouth/Throat: Oropharynx is clear and moist and mucous membranes are normal.  Eyes: Conjunctivae and EOM are normal. Pupils are equal, round, and reactive to light.  Neck: Normal range of motion. Neck supple.  Cardiovascular: Regular rhythm, S1 normal and S2 normal.  Exam reveals no gallop and no friction rub.   No murmur heard. Pulmonary/Chest: Effort normal and breath sounds normal. No respiratory distress. She exhibits no tenderness.  Abdominal: Soft. Normal appearance and bowel sounds are normal. There is no hepatosplenomegaly. There is tenderness. There is no rebound, no guarding, no tenderness at McBurney's point and negative Murphy's sign. No hernia.  Moderate LLLQ tenderness   Genitourinary:  Moderate L sided flank tenderness   Musculoskeletal: Normal range of motion.  Neurological: She is alert and oriented to person, place, and time. She has normal strength. No cranial nerve deficit or sensory deficit. Coordination normal. GCS eye subscore is 4. GCS verbal subscore is 5. GCS motor subscore is 6.  Skin: Skin is warm, dry and intact. No rash noted. No cyanosis.  Psychiatric: She has a normal mood and affect. Her speech is normal and behavior is normal. Thought content normal.  Nursing note and vitals reviewed.   ED Course  Procedures (including critical care time)  DIAGNOSTIC STUDIES: Oxygen Saturation is 97% on RA, adequate by my interpretation.    COORDINATION OF  CARE: 11:00 PM- Will give Zofran and Sublimaze. Will order CT renal stone study, Lipase, CMP, CBC, and urinalysis. Discussed treatment plan with pt at bedside and pt agreed to plan.     Labs Review Labs Reviewed  LIPASE, BLOOD  COMPREHENSIVE METABOLIC PANEL  CBC  URINALYSIS, ROUTINE W REFLEX MICROSCOPIC (NOT AT [facility])    Imaging Review No results found. I have personally reviewed and evaluated these images and lab results as part of my medical decision-making.   EKG Interpretation None      MDM   Final diagnoses:  None   renal colic  Presents to the emergency department for evaluation of left flank pain. Workup  confirms proximal ureteral stone on the left with mild hydronephrosis. Patient had significant improvement with fentanyl. Reviewing her allergies reveals he does not tolerate hydrocodone, oxycodone or codeine, has significant swelling with these. She will be given an additional dose of IV Dilaudid to get her through the night and had a limited supply of oral Dilaudid and Flomax, follow-up with urology.  I personally performed the services described in this documentation, which was scribed in my presence. The recorded information has been reviewed and is accurate.   Orpah Greek, MD 05/12/15 Laureen Abrahams

## 2015-05-12 LAB — COMPREHENSIVE METABOLIC PANEL
ALBUMIN: 4.1 g/dL (ref 3.5–5.0)
ALK PHOS: 52 U/L (ref 38–126)
ALT: 43 U/L (ref 14–54)
ANION GAP: 12 (ref 5–15)
AST: 42 U/L — ABNORMAL HIGH (ref 15–41)
BUN: 22 mg/dL — ABNORMAL HIGH (ref 6–20)
CHLORIDE: 104 mmol/L (ref 101–111)
CO2: 26 mmol/L (ref 22–32)
Calcium: 9.3 mg/dL (ref 8.9–10.3)
Creatinine, Ser: 0.79 mg/dL (ref 0.44–1.00)
GFR calc non Af Amer: >60 mL/min (ref >60)
GLUCOSE: 141 mg/dL — AB (ref 65–99)
Potassium: 4 mmol/L (ref 3.5–5.1)
Sodium: 142 mmol/L (ref 135–145)
Total Bilirubin: 0.8 mg/dL (ref 0.3–1.2)
Total Protein: 6.9 g/dL (ref 6.5–8.1)

## 2015-05-12 LAB — LIPASE, BLOOD: LIPASE: 17 U/L (ref 11–51)

## 2015-05-12 MED ORDER — TAMSULOSIN HCL 0.4 MG PO CAPS: 0.4000 mg | ORAL_CAPSULE | Freq: Every day | ORAL | Status: DC

## 2015-05-12 MED ORDER — HYDROMORPHONE HCL 1 MG/ML IJ SOLN
1.0000 mg | Freq: Once | INTRAMUSCULAR | Status: AC
Start: 2015-05-12 — End: 2015-05-12
  Administered 2015-05-12: 1 mg via INTRAVENOUS
  Filled 2015-05-12: qty 1

## 2015-05-12 MED ORDER — HYDROMORPHONE HCL 2 MG PO TABS: 2.0000 mg | ORAL_TABLET | Freq: Four times a day (QID) | ORAL | Status: DC | PRN

## 2015-05-12 NOTE — ED Notes (Signed)
Patient verbalizes understanding of discharge instructions, prescription medications, home care and follow up care. Patient out of department at this time with family. 

## 2015-05-12 NOTE — Discharge Instructions (Signed)
Kidney Stones °Kidney stones (urolithiasis) are deposits that form inside your kidneys. The intense pain is caused by the stone moving through the urinary tract. When the stone moves, the ureter goes into spasm around the stone. The stone is usually passed in the urine.  °CAUSES  °· A disorder that makes certain neck glands produce too much parathyroid hormone (primary hyperparathyroidism). °· A buildup of uric acid crystals, similar to gout in your joints. °· Narrowing (stricture) of the ureter. °· A kidney obstruction present at birth (congenital obstruction). °· Previous surgery on the kidney or ureters. °· Numerous kidney infections. °SYMPTOMS  °· Feeling sick to your stomach (nauseous). °· Throwing up (vomiting). °· Blood in the urine (hematuria). °· Pain that usually spreads (radiates) to the groin. °· Frequency or urgency of urination. °DIAGNOSIS  °· Taking a history and physical exam. °· Blood or urine tests. °· CT scan. °· Occasionally, an examination of the inside of the urinary bladder (cystoscopy) is performed. °TREATMENT  °· Observation. °· Increasing your fluid intake. °· Extracorporeal shock wave lithotripsy--This is a noninvasive procedure that uses shock waves to break up kidney stones. °· Surgery may be needed if you have severe pain or persistent obstruction. There are various surgical procedures. Most of the procedures are performed with the use of small instruments. Only small incisions are needed to accommodate these instruments, so recovery time is minimized. °The size, location, and chemical composition are all important variables that will determine the proper choice of action for you. Talk to your health care provider to better understand your situation so that you will minimize the risk of injury to yourself and your kidney.  °HOME CARE INSTRUCTIONS  °· Drink enough water and fluids to keep your urine clear or pale yellow. This will help you to pass the stone or stone fragments. °· Strain  all urine through the provided strainer. Keep all particulate matter and stones for your health care provider to see. The stone causing the pain may be as small as a grain of salt. It is very important to use the strainer each and every time you pass your urine. The collection of your stone will allow your health care provider to analyze it and verify that a stone has actually passed. The stone analysis will often identify what you can do to reduce the incidence of recurrences. °· Only take over-the-counter or prescription medicines for pain, discomfort, or fever as directed by your health care provider. °· Keep all follow-up visits as told by your health care provider. This is important. °· Get follow-up X-rays if required. The absence of pain does not always mean that the stone has passed. It may have only stopped moving. If the urine remains completely obstructed, it can cause loss of kidney function or even complete destruction of the kidney. It is your responsibility to make sure X-rays and follow-ups are completed. Ultrasounds of the kidney can show blockages and the status of the kidney. Ultrasounds are not associated with any radiation and can be performed easily in a matter of minutes. °· Make changes to your daily diet as told by your health care provider. You may be told to: °¨ Limit the amount of salt that you eat. °¨ Eat 5 or more servings of fruits and vegetables each day. °¨ Limit the amount of meat, poultry, fish, and eggs that you eat. °· Collect a 24-hour urine sample as told by your health care provider. You may need to collect another urine sample every 6-12   months. °SEEK MEDICAL CARE IF: °· You experience pain that is progressive and unresponsive to any pain medicine you have been prescribed. °SEEK IMMEDIATE MEDICAL CARE IF:  °· Pain cannot be controlled with the prescribed medicine. °· You have a fever or shaking chills. °· The severity or intensity of pain increases over 18 hours and is not  relieved by pain medicine. °· You develop a new onset of abdominal pain. °· You feel faint or pass out. °· You are unable to urinate. °  °This information is not intended to replace advice given to you by your health care provider. Make sure you discuss any questions you have with your health care provider. °  °Document Released: 04/29/2005 Document Revised: 01/18/2015 Document Reviewed: 09/30/2012 °[employer] Interactive Patient Education ©2016 [employer] ° °

## 2015-05-13 ENCOUNTER — Emergency Department (HOSPITAL_COMMUNITY): Payer: Commercial Managed Care - HMO

## 2015-05-13 ENCOUNTER — Encounter (HOSPITAL_COMMUNITY): Payer: Self-pay | Admitting: Emergency Medicine

## 2015-05-13 ENCOUNTER — Emergency Department (HOSPITAL_COMMUNITY)
Admission: EM | Admit: 2015-05-13 | Discharge: 2015-05-13 | Disposition: A | Discharge status: Home / Self Care | Payer: Commercial Managed Care - HMO | Attending: Emergency Medicine | Admitting: Emergency Medicine

## 2015-05-13 DIAGNOSIS — R1032 Left lower quadrant pain: Secondary | ICD-10-CM | POA: Diagnosis present

## 2015-05-13 DIAGNOSIS — M19041 Primary osteoarthritis, right hand: Secondary | ICD-10-CM | POA: Insufficient documentation

## 2015-05-13 DIAGNOSIS — I10 Essential (primary) hypertension: Secondary | ICD-10-CM | POA: Insufficient documentation

## 2015-05-13 DIAGNOSIS — M47816 Spondylosis without myelopathy or radiculopathy, lumbar region: Secondary | ICD-10-CM | POA: Diagnosis not present

## 2015-05-13 DIAGNOSIS — Z79899 Other long term (current) drug therapy: Secondary | ICD-10-CM | POA: Insufficient documentation

## 2015-05-13 DIAGNOSIS — Z7984 Long term (current) use of oral hypoglycemic drugs: Secondary | ICD-10-CM | POA: Diagnosis not present

## 2015-05-13 DIAGNOSIS — M19042 Primary osteoarthritis, left hand: Secondary | ICD-10-CM | POA: Insufficient documentation

## 2015-05-13 DIAGNOSIS — E669 Obesity, unspecified: Secondary | ICD-10-CM | POA: Diagnosis not present

## 2015-05-13 DIAGNOSIS — E785 Hyperlipidemia, unspecified: Secondary | ICD-10-CM | POA: Diagnosis not present

## 2015-05-13 DIAGNOSIS — N201 Calculus of ureter: Secondary | ICD-10-CM

## 2015-05-13 DIAGNOSIS — E119 Type 2 diabetes mellitus without complications: Secondary | ICD-10-CM | POA: Insufficient documentation

## 2015-05-13 DIAGNOSIS — Z87891 Personal history of nicotine dependence: Secondary | ICD-10-CM | POA: Insufficient documentation

## 2015-05-13 DIAGNOSIS — Q211 Atrial septal defect: Secondary | ICD-10-CM | POA: Diagnosis not present

## 2015-05-13 LAB — CBC WITH DIFFERENTIAL/PLATELET
BASOS ABS: 0 10*3/uL (ref 0.0–0.1)
Basophils Relative: 0 %
Eosinophils Absolute: 0.1 10*3/uL (ref 0.0–0.7)
Eosinophils Relative: 1 %
HEMATOCRIT: 41.1 % (ref 36.0–46.0)
HEMOGLOBIN: 13.4 g/dL (ref 12.0–15.0)
LYMPHS PCT: 13 %
Lymphs Abs: 1.5 10*3/uL (ref 0.7–4.0)
MCH: 30 pg (ref 26.0–34.0)
MCHC: 32.6 g/dL (ref 30.0–36.0)
MCV: 92.2 fL (ref 78.0–100.0)
MONO ABS: 0.6 10*3/uL (ref 0.1–1.0)
Monocytes Relative: 5 %
NEUTROS ABS: 9.5 10*3/uL — AB (ref 1.7–7.7)
Neutrophils Relative %: 81 %
Platelets: 202 10*3/uL (ref 150–400)
RBC: 4.46 MIL/uL (ref 3.87–5.11)
RDW: 13.8 % (ref 11.5–15.5)
WBC: 11.6 10*3/uL — AB (ref 4.0–10.5)

## 2015-05-13 LAB — URINALYSIS, ROUTINE W REFLEX MICROSCOPIC
Bilirubin Urine: NEGATIVE
Glucose, UA: NEGATIVE mg/dL
Ketones, ur: 15 mg/dL — AB
LEUKOCYTES UA: NEGATIVE
NITRITE: NEGATIVE
PROTEIN: NEGATIVE mg/dL
Specific Gravity, Urine: >1.03 — ABNORMAL HIGH (ref 1.005–1.030)
pH: 5.5 (ref 5.0–8.0)

## 2015-05-13 LAB — URINE MICROSCOPIC-ADD ON: WBC UA: NONE SEEN WBC/hpf (ref 0–5)

## 2015-05-13 LAB — BASIC METABOLIC PANEL
ANION GAP: 8 (ref 5–15)
BUN: 18 mg/dL (ref 6–20)
CO2: 28 mmol/L (ref 22–32)
Calcium: 9.3 mg/dL (ref 8.9–10.3)
Chloride: 107 mmol/L (ref 101–111)
Creatinine, Ser: 0.89 mg/dL (ref 0.44–1.00)
GFR calc Af Amer: >60 mL/min (ref >60)
GLUCOSE: 154 mg/dL — AB (ref 65–99)
POTASSIUM: 4.6 mmol/L (ref 3.5–5.1)
Sodium: 143 mmol/L (ref 135–145)

## 2015-05-13 MED ORDER — ONDANSETRON HCL 4 MG/2ML IJ SOLN
4.0000 mg | Freq: Once | INTRAMUSCULAR | Status: AC
  Administered 2015-05-13: 4 mg via INTRAVENOUS
  Filled 2015-05-13: qty 2

## 2015-05-13 MED ORDER — HYDROMORPHONE HCL 4 MG PO TABS: 4.0000 mg | ORAL_TABLET | Freq: Four times a day (QID) | ORAL | Status: DC | PRN

## 2015-05-13 MED ORDER — SODIUM CHLORIDE 0.9 % IV BOLUS (SEPSIS)
500.0000 mL | Freq: Once | INTRAVENOUS | Status: AC
  Administered 2015-05-13: 500 mL via INTRAVENOUS

## 2015-05-13 MED ORDER — HYDROMORPHONE HCL 1 MG/ML IJ SOLN
1.0000 mg | Freq: Once | INTRAMUSCULAR | Status: AC
  Administered 2015-05-13: 1 mg via INTRAVENOUS
  Filled 2015-05-13: qty 1

## 2015-05-13 MED ORDER — KETOROLAC TROMETHAMINE 30 MG/ML IJ SOLN
30.0000 mg | Freq: Once | INTRAMUSCULAR | Status: AC
  Administered 2015-05-13: 30 mg via INTRAVENOUS
  Filled 2015-05-13: qty 1

## 2015-05-13 NOTE — ED Provider Notes (Signed)
CSN: [id number]     Arrival date & time 05/13/15  1300 History   First MD Initiated Contact with Patient 05/13/15 1313     Chief Complaint  Patient presents with  . Flank Pain     (Consider location/radiation/quality/duration/timing/severity/associated sxs/prior Treatment) HPI Patient presents with left-sided flank pain. Seen 2 days ago diagnosed with a 4 mm left ureteral stone. States pain and improve that time and returned again today. It is still in left flank going to left groin. No dysuria. No fevers. She's had nausea with the episodes. Unrelieved with the oral Dilaudid at home. The pain is dull and constant. Not worse with movement. Past Medical History  Diagnosis Date  . Hyperlipidemia   . Prediabetes   . Hypertension   . Obesity   . Osteoarthritis of lumbar spine   . Osteoarthritis of thumb     bilateral  . Atrial septal defect   . Hip pain, right   . Diabetes mellitus without complication ([place])   . Renal disorder     kidney stones   Past Surgical History  Procedure Laterality Date  . Total hip arthroplasty  08/2010    Right hip  . Breast surgery  2008    Right breast  . Bunionectomy  1993    Right foot  . Breast reduction surgery    . Tonsillectomy    . Eyelid surgery     Family History  Problem Relation Age of Onset  . Hypertension Other   . Kidney disease Other   . Stroke Other   . Heart attack Other   . Cancer Other   . Obesity Other   . Hyperlipidemia Mother   . Hypertension Mother   . Heart disease Mother   . Macular degeneration Father   . Cancer Sister     colon  . Hypertension Sister    Social History  Substance Use Topics  . Smoking status: Former Smoker    Types: Cigarettes    Start date: 12/11/1965    Quit date: 07/30/1975  . Smokeless tobacco: Never Used  . Alcohol Use: 1.8 oz/week    3 Glasses of wine per week     Comment: occas.   OB History    No data available     Review of Systems  Constitutional: Negative for activity  change and appetite change.  Eyes: Negative for pain.  Respiratory: Negative for chest tightness and shortness of breath.   Cardiovascular: Negative for chest pain and leg swelling.  Gastrointestinal: Positive for nausea. Negative for vomiting, abdominal pain and diarrhea.  Genitourinary: Positive for flank pain.  Musculoskeletal: Negative for back pain and neck stiffness.  Skin: Negative for rash.  Neurological: Negative for weakness, numbness and headaches.  Psychiatric/Behavioral: Negative for behavioral problems.      Allergies  Aspirin; Benzocaine; Caffeine; Codeine; Lidocaine-epinephrine; Oxycodone-acetaminophen; Percocet; Procaine hcl; Pseudoephedrine hcl er; and Simvastatin  Home Medications   Prior to Admission medications   Medication Sig Start Date End Date Taking? Authorizing Provider  Alpha-Lipoic Acid 600 MG CAPS Take 600 mg by mouth at bedtime.   Yes Historical Provider, MD  Cholecalciferol (VITAMIN D3) 2000 UNITS TABS Take 1 tablet by mouth daily.   Yes Historical Provider, MD  Coenzyme Q10 (CO Q 10 PO) Take 300 mg by mouth daily.   Yes Historical Provider, MD  ibuprofen (ADVIL,MOTRIN) 200 MG tablet Take 400 mg by mouth every 6 (six) hours as needed for moderate pain.    Yes Historical Provider, MD  losartan (COZAAR) 50 MG tablet Take 1 tablet by mouth daily. 05/12/15  Yes Historical Provider, MD  metFORMIN (GLUCOPHAGE-XR) 500 MG 24 hr tablet Take 500 mg by mouth at bedtime.    Yes Historical Provider, MD  NORITATE 1 % cream Apply topically daily.  10/14/11  Yes Historical Provider, MD  Omega 3 1000 MG CAPS Take 1,000 mg by mouth daily.   Yes Historical Provider, MD  pravastatin (PRAVACHOL) 20 MG tablet Take 20 mg by mouth. Takes 3 times a week   Yes Historical Provider, MD  tamsulosin (FLOMAX) 0.4 MG CAPS capsule Take 1 capsule (0.4 mg total) by mouth daily. 05/12/15  Yes Orpah Greek, MD  HYDROmorphone (DILAUDID) 4 MG tablet Take 1 tablet (4 mg total) by mouth  every 6 (six) hours as needed for severe pain. 05/13/15   Davonna Belling, MD  Misc. Devices MISC CPAP    Historical Provider, MD   BP 141/61 mmHg  Pulse 95  Temp(Src) 97.7 F (36.5 C) (Oral)  Resp 20  Ht 5\' 5"  (1.651 m)  Wt 195 lb (88.451 kg)  BMI 32.45 kg/m2  SpO2 100% Physical Exam  Constitutional: She is oriented to person, place, and time. She appears well-developed and well-nourished.  HENT:  Head: Normocephalic and atraumatic.  Eyes: Pupils are equal, round, and reactive to light.  Neck: Normal range of motion. Neck supple.  Cardiovascular: Normal rate, regular rhythm and normal heart sounds.   No murmur heard. Pulmonary/Chest: Effort normal and breath sounds normal. No respiratory distress. She has no wheezes. She has no rales.  Abdominal: Soft. Bowel sounds are normal. She exhibits no distension. There is tenderness. There is no rebound and no guarding.  Mild left lower quadrant tenderness.  Genitourinary:  CVA tenderness on left.  Musculoskeletal: Normal range of motion.  Neurological: She is alert and oriented to person, place, and time. No cranial nerve deficit.  Skin: Skin is warm and dry.  Psychiatric: Her speech is normal.  Nursing note and vitals reviewed.   ED Course  Procedures (including critical care time) Labs Review Labs Reviewed  CBC WITH DIFFERENTIAL/PLATELET - Abnormal; Notable for the following:    WBC 11.6 (*)    Neutro Abs 9.5 (*)    All other components within normal limits  BASIC METABOLIC PANEL - Abnormal; Notable for the following:    Glucose, Bld 154 (*)    All other components within normal limits  URINALYSIS, ROUTINE W REFLEX MICROSCOPIC (NOT AT [facility]) - Abnormal; Notable for the following:    Specific Gravity, Urine >1.030 (*)    Hgb urine dipstick MODERATE (*)    Ketones, ur 15 (*)    All other components within normal limits  URINE MICROSCOPIC-ADD ON - Abnormal; Notable for the following:    Squamous Epithelial / LPF 6-30 (*)     Bacteria, UA RARE (*)    All other components within normal limits    Imaging Review Dg Abd 1 View  05/13/2015  CLINICAL DATA:  Left flank pain EXAM: ABDOMEN - 1 VIEW COMPARISON:  04/13/2012 FINDINGS: There are no disproportionally dilated loops of bowel. There is no obvious free intraperitoneal gas. Right total hip arthroplasty is stable. IMPRESSION: Nonobstructive bowel gas pattern. Electronically Signed   By: Marybelle Killings M.D.   On: 05/13/2015 14:43   Ct Renal Stone Study  05/11/2015  CLINICAL DATA:  69 year old female with left-sided flank pain. EXAM: CT ABDOMEN AND PELVIS WITHOUT CONTRAST TECHNIQUE: Multidetector CT imaging of the abdomen and  pelvis was performed following the standard protocol without IV contrast. COMPARISON:  CT dated 04/09/2012 and lumbar spine MRI dated 09/05/2011 FINDINGS: Evaluation of this exam is limited in the absence of intravenous contrast. Minimal bibasilar linear atelectasis/scarring. The visualized lung bases are otherwise clear. No intra-abdominal free air or free fluid. Diffuse hepatic steatosis. An 8 mm ill-defined right hepatic hypodense lesion (series 2, image 34) is not well characterized on this noncontrast study but likely represents a cyst or hemangioma. CT with contrast or MRI is recommended for further evaluation. The gallbladder, pancreas, spleen, adrenal glands, right kidney and right ureter, and urinary bladder appear unremarkable. There is a 4 mm left ureteropelvic junction stone with mild left hydronephrosis. Hysterectomy. There is sigmoid diverticulosis without active inflammatory changes. There is a 1.4 cm low attenuatin/fatty lesion in the distal small bowel in the right lower quadrant (series 2, image 60) which may represent a small lipoma. There is no evidence of bowel obstruction. Normal appendix. There is aortoiliac atherosclerotic disease. No portal venous gas identified. There is no adenopathy. Small fat containing umbilical hernia. The  abdominal wall soft tissues appear unremarkable. There is a right hip arthroplasty. There multilevel degenerative changes of the spine. No acute fractures. IMPRESSION: A 4 mm left UPJ stone with mild left hydronephrosis. Fatty liver. A subcentimeter right hepatic hypodense lesion, likely a hemangioma or cyst. MRI may provide better characterization. Electronically Signed   By: Anner Crete M.D.   On: 05/11/2015 23:45   I have personally reviewed and evaluated these images and lab results as part of my medical decision-making.   EKG Interpretation None      MDM   Final diagnoses:  Ureteral stone    Patient with ureteral stone. Recent one seen on CT. Pain return. Does not appear to be septic at this time. Will try Toradol after some mild relief with Dilaudid. Care turned over to DR Joesph Fillers, MD 05/13/15 [phone]

## 2015-05-13 NOTE — ED Notes (Signed)
Pt states that she has been having left flank pain for the past few days.  Was seen 2 days ago for kidney stones.

## 2015-05-13 NOTE — Discharge Instructions (Signed)
Kidney Stones °Kidney stones (urolithiasis) are deposits that form inside your kidneys. The intense pain is caused by the stone moving through the urinary tract. When the stone moves, the ureter goes into spasm around the stone. The stone is usually passed in the urine.  °CAUSES  °· A disorder that makes certain neck glands produce too much parathyroid hormone (primary hyperparathyroidism). °· A buildup of uric acid crystals, similar to gout in your joints. °· Narrowing (stricture) of the ureter. °· A kidney obstruction present at birth (congenital obstruction). °· Previous surgery on the kidney or ureters. °· Numerous kidney infections. °SYMPTOMS  °· Feeling sick to your stomach (nauseous). °· Throwing up (vomiting). °· Blood in the urine (hematuria). °· Pain that usually spreads (radiates) to the groin. °· Frequency or urgency of urination. °DIAGNOSIS  °· Taking a history and physical exam. °· Blood or urine tests. °· CT scan. °· Occasionally, an examination of the inside of the urinary bladder (cystoscopy) is performed. °TREATMENT  °· Observation. °· Increasing your fluid intake. °· Extracorporeal shock wave lithotripsy--This is a noninvasive procedure that uses shock waves to break up kidney stones. °· Surgery may be needed if you have severe pain or persistent obstruction. There are various surgical procedures. Most of the procedures are performed with the use of small instruments. Only small incisions are needed to accommodate these instruments, so recovery time is minimized. °The size, location, and chemical composition are all important variables that will determine the proper choice of action for you. Talk to your health care provider to better understand your situation so that you will minimize the risk of injury to yourself and your kidney.  °HOME CARE INSTRUCTIONS  °· Drink enough water and fluids to keep your urine clear or pale yellow. This will help you to pass the stone or stone fragments. °· Strain  all urine through the provided strainer. Keep all particulate matter and stones for your health care provider to see. The stone causing the pain may be as small as a grain of salt. It is very important to use the strainer each and every time you pass your urine. The collection of your stone will allow your health care provider to analyze it and verify that a stone has actually passed. The stone analysis will often identify what you can do to reduce the incidence of recurrences. °· Only take over-the-counter or prescription medicines for pain, discomfort, or fever as directed by your health care provider. °· Keep all follow-up visits as told by your health care provider. This is important. °· Get follow-up X-rays if required. The absence of pain does not always mean that the stone has passed. It may have only stopped moving. If the urine remains completely obstructed, it can cause loss of kidney function or even complete destruction of the kidney. It is your responsibility to make sure X-rays and follow-ups are completed. Ultrasounds of the kidney can show blockages and the status of the kidney. Ultrasounds are not associated with any radiation and can be performed easily in a matter of minutes. °· Make changes to your daily diet as told by your health care provider. You may be told to: °¨ Limit the amount of salt that you eat. °¨ Eat 5 or more servings of fruits and vegetables each day. °¨ Limit the amount of meat, poultry, fish, and eggs that you eat. °· Collect a 24-hour urine sample as told by your health care provider. You may need to collect another urine sample every 6-12   months. °SEEK MEDICAL CARE IF: °· You experience pain that is progressive and unresponsive to any pain medicine you have been prescribed. °SEEK IMMEDIATE MEDICAL CARE IF:  °· Pain cannot be controlled with the prescribed medicine. °· You have a fever or shaking chills. °· The severity or intensity of pain increases over 18 hours and is not  relieved by pain medicine. °· You develop a new onset of abdominal pain. °· You feel faint or pass out. °· You are unable to urinate. °  °This information is not intended to replace advice given to you by your health care provider. Make sure you discuss any questions you have with your health care provider. °  °Document Released: 04/29/2005 Document Revised: 01/18/2015 Document Reviewed: 09/30/2012 °[employer] Interactive Patient Education ©2016 [employer] ° °

## 2015-05-13 NOTE — ED Provider Notes (Signed)
17:50- evaluation after treatment for left flank pain, with known left UVJ stone. At this time, her pain is 4/10, and tolerable. Patient is comfortable going home. She plans on taking the narcotic analgesia, every 4-6 hours, until she passes the stone. She will take her Flomax dose tonight. She has an outpatient appointment with urology scheduled. She understands that if the pain is uncontrolled, she will need to seek care, at [facility] long emergency department.  Daleen Bo, MD 05/13/15 [phone]

## 2015-05-18 ENCOUNTER — Emergency Department (HOSPITAL_COMMUNITY): Payer: Medicare Other

## 2015-05-18 ENCOUNTER — Emergency Department (HOSPITAL_COMMUNITY)
Admission: EM | Admit: 2015-05-18 | Discharge: 2015-05-18 | Disposition: A | Discharge status: Home / Self Care | Payer: Medicare Other | Attending: Emergency Medicine | Admitting: Emergency Medicine

## 2015-05-18 ENCOUNTER — Encounter (HOSPITAL_COMMUNITY): Payer: Self-pay | Admitting: Emergency Medicine

## 2015-05-18 DIAGNOSIS — E669 Obesity, unspecified: Secondary | ICD-10-CM | POA: Insufficient documentation

## 2015-05-18 DIAGNOSIS — M19041 Primary osteoarthritis, right hand: Secondary | ICD-10-CM | POA: Insufficient documentation

## 2015-05-18 DIAGNOSIS — M47816 Spondylosis without myelopathy or radiculopathy, lumbar region: Secondary | ICD-10-CM | POA: Insufficient documentation

## 2015-05-18 DIAGNOSIS — Z87891 Personal history of nicotine dependence: Secondary | ICD-10-CM | POA: Diagnosis not present

## 2015-05-18 DIAGNOSIS — K5900 Constipation, unspecified: Secondary | ICD-10-CM | POA: Diagnosis not present

## 2015-05-18 DIAGNOSIS — I10 Essential (primary) hypertension: Secondary | ICD-10-CM | POA: Insufficient documentation

## 2015-05-18 DIAGNOSIS — N201 Calculus of ureter: Secondary | ICD-10-CM

## 2015-05-18 DIAGNOSIS — M19042 Primary osteoarthritis, left hand: Secondary | ICD-10-CM | POA: Diagnosis not present

## 2015-05-18 DIAGNOSIS — Q211 Atrial septal defect: Secondary | ICD-10-CM | POA: Diagnosis not present

## 2015-05-18 DIAGNOSIS — E119 Type 2 diabetes mellitus without complications: Secondary | ICD-10-CM | POA: Diagnosis not present

## 2015-05-18 DIAGNOSIS — Z79899 Other long term (current) drug therapy: Secondary | ICD-10-CM | POA: Insufficient documentation

## 2015-05-18 DIAGNOSIS — E785 Hyperlipidemia, unspecified: Secondary | ICD-10-CM | POA: Insufficient documentation

## 2015-05-18 DIAGNOSIS — R1032 Left lower quadrant pain: Secondary | ICD-10-CM | POA: Diagnosis present

## 2015-05-18 LAB — BASIC METABOLIC PANEL
Anion gap: 11 (ref 5–15)
BUN: 21 mg/dL — AB (ref 6–20)
CHLORIDE: 100 mmol/L — AB (ref 101–111)
CO2: 28 mmol/L (ref 22–32)
CREATININE: 1.18 mg/dL — AB (ref 0.44–1.00)
Calcium: 9.3 mg/dL (ref 8.9–10.3)
GFR calc Af Amer: 53 mL/min — ABNORMAL LOW (ref >60)
GFR calc non Af Amer: 46 mL/min — ABNORMAL LOW (ref >60)
Glucose, Bld: 130 mg/dL — ABNORMAL HIGH (ref 65–99)
Potassium: 3.9 mmol/L (ref 3.5–5.1)
SODIUM: 139 mmol/L (ref 135–145)

## 2015-05-18 LAB — CBC
HCT: 40.8 % (ref 36.0–46.0)
Hemoglobin: 13.2 g/dL (ref 12.0–15.0)
MCH: 29.9 pg (ref 26.0–34.0)
MCHC: 32.4 g/dL (ref 30.0–36.0)
MCV: 92.5 fL (ref 78.0–100.0)
Platelets: 255 10*3/uL (ref 150–400)
RBC: 4.41 MIL/uL (ref 3.87–5.11)
RDW: 13.3 % (ref 11.5–15.5)
WBC: 9.4 10*3/uL (ref 4.0–10.5)

## 2015-05-18 LAB — URINALYSIS, ROUTINE W REFLEX MICROSCOPIC
BILIRUBIN URINE: NEGATIVE
Glucose, UA: NEGATIVE mg/dL
KETONES UR: 15 mg/dL — AB
Nitrite: NEGATIVE
PROTEIN: NEGATIVE mg/dL
SPECIFIC GRAVITY, URINE: 1.014 (ref 1.005–1.030)
pH: 6.5 (ref 5.0–8.0)

## 2015-05-18 LAB — URINE MICROSCOPIC-ADD ON

## 2015-05-18 MED ORDER — KETOROLAC TROMETHAMINE 30 MG/ML IJ SOLN
30.0000 mg | Freq: Once | INTRAMUSCULAR | Status: AC
  Administered 2015-05-18: 30 mg via INTRAMUSCULAR

## 2015-05-18 MED ORDER — KETOROLAC TROMETHAMINE 30 MG/ML IJ SOLN
15.0000 mg | Freq: Once | INTRAMUSCULAR | Status: DC
  Filled 2015-05-18: qty 1

## 2015-05-18 MED ORDER — HYDROMORPHONE HCL 1 MG/ML IJ SOLN
1.0000 mg | Freq: Once | INTRAMUSCULAR | Status: AC
  Administered 2015-05-18: 1 mg via INTRAVENOUS
  Filled 2015-05-18: qty 1

## 2015-05-18 NOTE — ED Notes (Signed)
Verbalized understanding discharge instructions. In no acute distress.   

## 2015-05-18 NOTE — ED Notes (Signed)
Per pt, states she was diagnosed with kidney stone on the 27 th-states increased pain on the left side-was seen at [facility] to come here if further issues

## 2015-05-18 NOTE — Discharge Instructions (Signed)
Return here as needed.  Follow-up tomorrow with urology in their [place] office.

## 2015-05-18 NOTE — ED Provider Notes (Signed)
CSN: [id number]     Arrival date & time 05/18/15  0730 History  rs. Julie Owen is a 70 y/o woman with First MD Initiated Contact with Patient 05/18/15 [phone]     Chief Complaint  Patient presents with  . Flank Pain     (Consider location/radiation/quality/duration/timing/severity/associated sxs/prior Treatment) HPI  Patient presents to the emergency department with a known 58mm left UPJ stone who presents to the ED today for the third time for sharp LLQ and flank pain related to this issue. She explains her left sided flank pain began approximately 2 weeks ago. She went to the ED in [place] for pain, vomiting and fever. CT diagnosed 59mm UPJ stone. Patient was given pain medication and instructed to wait for it to pass. She returned to the ED on 12/31 for continued left flank pain, nausea, vomiting, fever. At this point she was given Dilaudid and instructed to take it q4-6h. She was also given Flomax for daily use. Patient took Dilaudid Saturday q6h but stopped due to constipation. She took the Flomax irregularly. She endorses fever up to 101, night sweats, chills, diffuse abdominal pain, nausea, and vomiting.Denies dysuria, gross hematuria, diarrhea.  Past Medical History  Diagnosis Date  . Hyperlipidemia   . Prediabetes   . Hypertension   . Obesity   . Osteoarthritis of lumbar spine   . Osteoarthritis of thumb     bilateral  . Atrial septal defect   . Hip pain, right   . Diabetes mellitus without complication ([place])   . Renal disorder     kidney stones   Past Surgical History  Procedure Laterality Date  . Total hip arthroplasty  08/2010    Right hip  . Breast surgery  2008    Right breast  . Bunionectomy  1993    Right foot  . Breast reduction surgery    . Tonsillectomy    . Eyelid surgery     Family History  Problem Relation Age of Onset  . Hypertension Other   . Kidney disease Other   . Stroke Other   . Heart attack Other   . Cancer Other   . Obesity Other   .  Hyperlipidemia Mother   . Hypertension Mother   . Heart disease Mother   . Macular degeneration Father   . Cancer Sister     colon  . Hypertension Sister    Social History  Substance Use Topics  . Smoking status: Former Smoker    Types: Cigarettes    Start date: 12/11/1965    Quit date: 07/30/1975  . Smokeless tobacco: Never Used  . Alcohol Use: 1.8 oz/week    3 Glasses of wine per week     Comment: occas.   OB History    No data available     Review of Systems  Constitutional: Positive for fever, chills and fatigue. Negative for appetite change and unexpected weight change.  Respiratory: Negative for cough and shortness of breath.   Cardiovascular: Negative for chest pain.  Gastrointestinal: Positive for nausea, vomiting, abdominal pain, constipation and abdominal distention. Negative for diarrhea and blood in stool.  Genitourinary: Positive for flank pain. Negative for dysuria, decreased urine volume and difficulty urinating.  Neurological: Negative for dizziness.      Allergies  Aspirin; Benzocaine; Caffeine; Codeine; Lidocaine-epinephrine; Oxycodone-acetaminophen; Percocet; Procaine hcl; Pseudoephedrine hcl er; and Simvastatin  Home Medications   Prior to Admission medications   Medication Sig Start Date End Date Taking? Authorizing Provider  Alpha-Lipoic Acid  600 MG CAPS Take 600 mg by mouth at bedtime.    Historical Provider, MD  Cholecalciferol (VITAMIN D3) 2000 UNITS TABS Take 1 tablet by mouth daily.    Historical Provider, MD  Coenzyme Q10 (CO Q 10 PO) Take 300 mg by mouth daily.    Historical Provider, MD  HYDROmorphone (DILAUDID) 4 MG tablet Take 1 tablet (4 mg total) by mouth every 6 (six) hours as needed for severe pain. 05/13/15   Davonna Belling, MD  ibuprofen (ADVIL,MOTRIN) 200 MG tablet Take 400 mg by mouth every 6 (six) hours as needed for moderate pain.     Historical Provider, MD  losartan (COZAAR) 50 MG tablet Take 1 tablet by mouth daily. 05/12/15    Historical Provider, MD  metFORMIN (GLUCOPHAGE-XR) 500 MG 24 hr tablet Take 500 mg by mouth at bedtime.     Historical Provider, MD  Misc. Devices MISC CPAP    Historical Provider, MD  NORITATE 1 % cream Apply topically daily.  10/14/11   Historical Provider, MD  Omega 3 1000 MG CAPS Take 1,000 mg by mouth daily.    Historical Provider, MD  pravastatin (PRAVACHOL) 20 MG tablet Take 20 mg by mouth. Takes 3 times a week    Historical Provider, MD  tamsulosin (FLOMAX) 0.4 MG CAPS capsule Take 1 capsule (0.4 mg total) by mouth daily. 05/12/15   Orpah Greek, MD   BP 133/45 mmHg  Pulse 73  Temp(Src) 98.3 F (36.8 C)  Resp 18  SpO2 100% Physical Exam  Constitutional: She is oriented to person, place, and time. She appears well-developed and well-nourished.  Obese in mild distress    HENT:  Head: Normocephalic and atraumatic.  Mouth/Throat: Oropharynx is clear and moist.  Eyes: Pupils are equal, round, and reactive to light.  Neck: Normal range of motion. Neck supple.  Cardiovascular: Normal rate, regular rhythm and normal heart sounds.  Exam reveals no gallop and no friction rub.   No murmur heard. Pulmonary/Chest: Effort normal and breath sounds normal. No respiratory distress. She has no wheezes.  Abdominal: Soft. Bowel sounds are normal. She exhibits no distension. There is tenderness in the left lower quadrant. There is no rebound and no guarding.  Tender to palpation of left flank  Musculoskeletal: She exhibits no edema.  Neurological: She is alert and oriented to person, place, and time.  Skin: Skin is warm and dry. No rash noted. No erythema. No pallor.  Psychiatric: She has a normal mood and affect. Her behavior is normal.  Nursing note and vitals reviewed.   ED Course  Procedures (including critical care time) Labs Review Labs Reviewed  BASIC METABOLIC PANEL - Abnormal; Notable for the following:    Chloride 100 (*)    Glucose, Bld 130 (*)    BUN 21 (*)     Creatinine, Ser 1.18 (*)    GFR calc non Af Amer 46 (*)    GFR calc Af Amer 53 (*)    All other components within normal limits  CBC  URINALYSIS, ROUTINE W REFLEX MICROSCOPIC (NOT AT [facility])    Imaging Review No results found. I have personally reviewed and evaluated these images and lab results as part of my medical decision-making.  Spoke with Dr. Karsten Ro of urology, who advised that she can be seen tomorrow in their [place] office a.  She has an 8:15 appointment with Dr. Alyson Ingles patient is advised of the results.  All questions were answered.  Patient agrees the plan.  Patient  is having no pain at this time   Dalia Heading, PA-C 05/18/15 [address], PA-C 05/18/15 1243  Dorie Rank, MD 05/18/15 1259

## 2015-05-19 ENCOUNTER — Ambulatory Visit (HOSPITAL_COMMUNITY): Payer: Medicare Other | Admitting: Anesthesiology

## 2015-05-19 ENCOUNTER — Ambulatory Visit (HOSPITAL_COMMUNITY)
Admission: AD | Admit: 2015-05-19 | Discharge: 2015-05-19 | Disposition: A | Discharge status: Home / Self Care | Payer: Medicare Other | Source: Ambulatory Visit | Attending: Urology | Admitting: Urology

## 2015-05-19 ENCOUNTER — Ambulatory Visit (INDEPENDENT_AMBULATORY_CARE_PROVIDER_SITE_OTHER): Payer: Medicare Other | Admitting: Urology

## 2015-05-19 ENCOUNTER — Ambulatory Visit (HOSPITAL_COMMUNITY): Payer: Medicare Other

## 2015-05-19 ENCOUNTER — Encounter (HOSPITAL_COMMUNITY)
Admission: AD | Disposition: A | Discharge status: Home / Self Care | Payer: Self-pay | Source: Ambulatory Visit | Attending: Urology

## 2015-05-19 ENCOUNTER — Encounter (HOSPITAL_COMMUNITY): Payer: Self-pay | Admitting: *Deleted

## 2015-05-19 DIAGNOSIS — N200 Calculus of kidney: Secondary | ICD-10-CM

## 2015-05-19 DIAGNOSIS — G4730 Sleep apnea, unspecified: Secondary | ICD-10-CM | POA: Diagnosis not present

## 2015-05-19 DIAGNOSIS — M47816 Spondylosis without myelopathy or radiculopathy, lumbar region: Secondary | ICD-10-CM | POA: Insufficient documentation

## 2015-05-19 DIAGNOSIS — N201 Calculus of ureter: Secondary | ICD-10-CM | POA: Insufficient documentation

## 2015-05-19 DIAGNOSIS — Z79899 Other long term (current) drug therapy: Secondary | ICD-10-CM | POA: Diagnosis not present

## 2015-05-19 DIAGNOSIS — Z87891 Personal history of nicotine dependence: Secondary | ICD-10-CM | POA: Diagnosis not present

## 2015-05-19 DIAGNOSIS — Z7984 Long term (current) use of oral hypoglycemic drugs: Secondary | ICD-10-CM | POA: Diagnosis not present

## 2015-05-19 DIAGNOSIS — Z96641 Presence of right artificial hip joint: Secondary | ICD-10-CM | POA: Diagnosis not present

## 2015-05-19 DIAGNOSIS — E669 Obesity, unspecified: Secondary | ICD-10-CM | POA: Diagnosis not present

## 2015-05-19 DIAGNOSIS — E785 Hyperlipidemia, unspecified: Secondary | ICD-10-CM | POA: Diagnosis not present

## 2015-05-19 DIAGNOSIS — I10 Essential (primary) hypertension: Secondary | ICD-10-CM | POA: Diagnosis not present

## 2015-05-19 DIAGNOSIS — Z791 Long term (current) use of non-steroidal anti-inflammatories (NSAID): Secondary | ICD-10-CM | POA: Diagnosis not present

## 2015-05-19 HISTORY — PX: CYSTOSCOPY WITH RETROGRADE PYELOGRAM, URETEROSCOPY AND STENT PLACEMENT: SHX5789

## 2015-05-19 HISTORY — PX: STONE EXTRACTION WITH BASKET: SHX5318

## 2015-05-19 LAB — URINE CULTURE

## 2015-05-19 LAB — GLUCOSE, CAPILLARY: GLUCOSE-CAPILLARY: 96 mg/dL (ref 65–99)

## 2015-05-19 SURGERY — CYSTOURETEROSCOPY, WITH RETROGRADE PYELOGRAM AND STENT INSERTION
Anesthesia: General | Laterality: Left

## 2015-05-19 MED ORDER — TAMSULOSIN HCL 0.4 MG PO CAPS: 0.4000 mg | ORAL_CAPSULE | Freq: Every day | ORAL | Status: DC

## 2015-05-19 MED ORDER — PROPOFOL 10 MG/ML IV BOLUS
INTRAVENOUS | Status: DC | PRN
  Administered 2015-05-19: 150 mg via INTRAVENOUS

## 2015-05-19 MED ORDER — CEFAZOLIN SODIUM-DEXTROSE 2-3 GM-% IV SOLR
INTRAVENOUS | Status: AC
  Filled 2015-05-19: qty 50

## 2015-05-19 MED ORDER — FENTANYL CITRATE (PF) 100 MCG/2ML IJ SOLN
INTRAMUSCULAR | Status: AC
  Filled 2015-05-19: qty 2

## 2015-05-19 MED ORDER — LACTATED RINGERS IV SOLN
INTRAVENOUS | Status: DC
  Administered 2015-05-19 (×2): via INTRAVENOUS

## 2015-05-19 MED ORDER — ONDANSETRON HCL 4 MG/2ML IJ SOLN
INTRAMUSCULAR | Status: AC
  Filled 2015-05-19: qty 2

## 2015-05-19 MED ORDER — SODIUM CHLORIDE 0.9 % IJ SOLN
INTRAMUSCULAR | Status: AC
  Filled 2015-05-19: qty 10

## 2015-05-19 MED ORDER — MIDAZOLAM HCL 2 MG/2ML IJ SOLN
INTRAMUSCULAR | Status: AC
  Filled 2015-05-19: qty 2

## 2015-05-19 MED ORDER — PHENAZOPYRIDINE HCL 100 MG PO TABS: 100.0000 mg | ORAL_TABLET | Freq: Three times a day (TID) | ORAL | Status: DC | PRN

## 2015-05-19 MED ORDER — FENTANYL CITRATE (PF) 250 MCG/5ML IJ SOLN
INTRAMUSCULAR | Status: AC
  Filled 2015-05-19: qty 5

## 2015-05-19 MED ORDER — HYDROMORPHONE HCL 4 MG PO TABS: 4.0000 mg | ORAL_TABLET | Freq: Four times a day (QID) | ORAL | Status: DC | PRN

## 2015-05-19 MED ORDER — IOHEXOL 350 MG/ML SOLN
INTRAVENOUS | Status: DC | PRN
  Administered 2015-05-19: 10 mL via INTRAVENOUS

## 2015-05-19 MED ORDER — FENTANYL CITRATE (PF) 100 MCG/2ML IJ SOLN
25.0000 ug | INTRAMUSCULAR | Status: AC
  Administered 2015-05-19: 25 ug via INTRAVENOUS

## 2015-05-19 MED ORDER — GLYCOPYRROLATE 0.2 MG/ML IJ SOLN
INTRAMUSCULAR | Status: AC
  Filled 2015-05-19: qty 1

## 2015-05-19 MED ORDER — ONDANSETRON 4 MG PO TBDP: 4.0000 mg | ORAL_TABLET | Freq: Three times a day (TID) | ORAL | Status: DC | PRN

## 2015-05-19 MED ORDER — GLYCOPYRROLATE 0.2 MG/ML IJ SOLN
0.2000 mg | Freq: Once | INTRAMUSCULAR | Status: AC
  Administered 2015-05-19: 0.2 mg via INTRAVENOUS

## 2015-05-19 MED ORDER — FENTANYL CITRATE (PF) 100 MCG/2ML IJ SOLN
INTRAMUSCULAR | Status: DC | PRN
  Administered 2015-05-19: 50 ug via INTRAVENOUS
  Administered 2015-05-19 (×2): 25 ug via INTRAVENOUS

## 2015-05-19 MED ORDER — CEFAZOLIN (ANCEF) 1 G IV SOLR
1.0000 g | INTRAVENOUS | Status: AC
  Administered 2015-05-19: 1 g
  Filled 2015-05-19: qty 1

## 2015-05-19 MED ORDER — EPHEDRINE SULFATE 50 MG/ML IJ SOLN
INTRAMUSCULAR | Status: AC
  Filled 2015-05-19: qty 1

## 2015-05-19 MED ORDER — ONDANSETRON HCL 4 MG/2ML IJ SOLN
4.0000 mg | Freq: Once | INTRAMUSCULAR | Status: AC
  Administered 2015-05-19: 4 mg via INTRAVENOUS

## 2015-05-19 MED ORDER — OXYBUTYNIN CHLORIDE ER 10 MG PO TB24: 10.0000 mg | ORAL_TABLET | Freq: Every day | ORAL | Status: DC

## 2015-05-19 MED ORDER — MIDAZOLAM HCL 2 MG/2ML IJ SOLN
1.0000 mg | INTRAMUSCULAR | Status: DC | PRN
  Administered 2015-05-19: 2 mg via INTRAVENOUS

## 2015-05-19 MED ORDER — SODIUM CHLORIDE 0.9 % IR SOLN
Status: DC | PRN
  Administered 2015-05-19: 3000 mL

## 2015-05-19 SURGICAL SUPPLY — 25 items
BAG HAMPER (MISCELLANEOUS) ×3 IMPLANT
BAG URO CATCHER STRL LF (MISCELLANEOUS) ×3 IMPLANT
BASKET LASER NITINOL 1.9FR (BASKET) IMPLANT
BASKET ZERO TIP NITINOL 2.4FR (BASKET) IMPLANT
CATH INTERMIT  6FR 70CM (CATHETERS) IMPLANT
CLOTH BEACON ORANGE TIMEOUT ST (SAFETY) ×3 IMPLANT
EXTRACTOR STONE NITINOL NGAGE (UROLOGICAL SUPPLIES) ×3 IMPLANT
FIBER LASER FLEXIVA 365 (UROLOGICAL SUPPLIES) IMPLANT
GLOVE BIO SURGEON STRL SZ8 (GLOVE) ×3 IMPLANT
GOWN STRL REUS W/ TWL LRG LVL3 (GOWN DISPOSABLE) ×1 IMPLANT
GOWN STRL REUS W/ TWL XL LVL3 (GOWN DISPOSABLE) ×1 IMPLANT
GOWN STRL REUS W/TWL LRG LVL3 (GOWN DISPOSABLE) ×2
GOWN STRL REUS W/TWL XL LVL3 (GOWN DISPOSABLE) ×2
GUIDEWIRE ANG ZIPWIRE 038X150 (WIRE) ×3 IMPLANT
GUIDEWIRE STR DUAL SENSOR (WIRE) ×3 IMPLANT
GUIDEWIRE STR ZIPWIRE 035X150 (MISCELLANEOUS) ×3 IMPLANT
IV NS IRRIG 3000ML ARTHROMATIC (IV SOLUTION) ×6 IMPLANT
LASER FIBER DISP (UROLOGICAL SUPPLIES) IMPLANT
MANIFOLD NEPTUNE II (INSTRUMENTS) ×3 IMPLANT
PACK CYSTO (CUSTOM PROCEDURE TRAY) ×3 IMPLANT
PAD ARMBOARD 7.5X6 YLW CONV (MISCELLANEOUS) ×3 IMPLANT
STENT URET 6FRX26 CONTOUR (STENTS) ×3 IMPLANT
SYRINGE 10CC LL (SYRINGE) ×3 IMPLANT
TOWEL OR 17X26 4PK STRL BLUE (TOWEL DISPOSABLE) ×3 IMPLANT
TUBE FEEDING 8FR 16IN STR KANG (MISCELLANEOUS) IMPLANT

## 2015-05-19 NOTE — Brief Op Note (Signed)
05/19/2015  5:08 PM  PATIENT:  Julie Owen  70 y.o. female  PRE-OPERATIVE DIAGNOSIS:  left ureteral calculi  POST-OPERATIVE DIAGNOSIS:  same  PROCEDURE:   Cystoscopy Left retrograde pyelography Left diagnostic ureteroscopy Left ureteral stent placement  SURGEON:  Surgeon(s) and Role:    * Cleon Gustin, MD - Primary  PHYSICIAN ASSISTANT:   ASSISTANTS: none   ANESTHESIA:   general  EBL:  Total I/O In: 400 [I.V.:400] Out: -   BLOOD ADMINISTERED:none  DRAINS: left 6x26 JJ ureteral stent  LOCAL MEDICATIONS USED:  NONE  SPECIMEN:  No Specimen  DISPOSITION OF SPECIMEN:  N/A  COUNTS:  YES  TOURNIQUET:  * No tourniquets in log *  DICTATION: .Note written in EPIC  PLAN OF CARE: Discharge to home after PACU  PATIENT DISPOSITION:  PACU - hemodynamically stable.   Delay start of Pharmacological VTE agent (>24hrs) due to surgical blood loss or risk of bleeding: not applicable

## 2015-05-19 NOTE — Anesthesia Postprocedure Evaluation (Signed)
Anesthesia Post Note  Patient: Julie Owen  Procedure(s) Performed: Procedure(s) (LRB): CYSTOSCOPY WITH RETROGRADE PYELOGRAM, URETEROSCOPY AND STENT PLACEMENT (Left) STONE EXTRACTION WITH BASKET (Left)  Patient location during evaluation: PACU Anesthesia Type: General Level of consciousness: awake and alert and oriented Pain management: pain level controlled Vital Signs Assessment: post-procedure vital signs reviewed and stable Respiratory status: spontaneous breathing and respiratory function stable Cardiovascular status: stable Postop Assessment: no signs of nausea or vomiting Anesthetic complications: no    Last Vitals:  Filed Vitals:   05/19/15 1625 05/19/15 1630  BP: 135/68 141/64  Temp:    Resp: 17 17    Last Pain:  Filed Vitals:   05/19/15 1634  PainSc: 5                  Annora Guderian A

## 2015-05-19 NOTE — H&P (Signed)
Urology Admission H&P  Chief Complaint: left flank pain  History of Present Illness: Ms Julie Owen is a [phone] with new onset left flank pain,. She was diagnosed with a 73m left proximal ureteral calculus on 12/29 and has failed MET. She has associated nause and vomiting. She has severe, intermittent, nonradiaitng left flank pain  Past Medical History  Diagnosis Date  . Hyperlipidemia   . Prediabetes   . Hypertension   . Obesity   . Osteoarthritis of lumbar spine   . Osteoarthritis of thumb     bilateral  . Atrial septal defect   . Hip pain, right   . Diabetes mellitus without complication ([place]   . Renal disorder     kidney stones   Past Surgical History  Procedure Laterality Date  . Total hip arthroplasty  08/2010    Right hip  . Breast surgery  2008    Right breast  . Bunionectomy  1993    Right foot  . Breast reduction surgery    . Tonsillectomy    . Eyelid surgery      Home Medications:  Prescriptions prior to admission  Medication Sig Dispense Refill Last Dose  . Alpha-Lipoic Acid 600 MG CAPS Take 600 mg by mouth at bedtime.   05/18/2015 at Unknown time  . BENZONATATE PO Take 1 capsule by mouth daily as needed (cough).   05/18/2015 at Unknown time  . Cholecalciferol (VITAMIN D3) 2000 UNITS TABS Take 1 tablet by mouth daily.   05/18/2015 at Unknown time  . fluticasone (FLONASE) 50 MCG/ACT nasal spray Place 2 sprays into both nostrils daily as needed for allergies or rhinitis.   05/18/2015 at Unknown time  . guaiFENesin (ROBITUSSIN) 100 MG/5ML SOLN Take 5 mLs by mouth every 4 (four) hours as needed for cough or to loosen phlegm.   05/18/2015 at Unknown time  . HYDROmorphone (DILAUDID) 2 MG tablet Take 2 mg by mouth every 6 (six) hours as needed for moderate pain or severe pain.   05/18/2015 at Unknown time  . HYDROmorphone (DILAUDID) 4 MG tablet Take 1 tablet (4 mg total) by mouth every 6 (six) hours as needed for severe pain. 10 tablet 0 05/18/2015 at Unknown time  . ibuprofen  (ADVIL,MOTRIN) 200 MG tablet Take 400 mg by mouth every 6 (six) hours as needed for moderate pain.    05/18/2015 at Unknown time  . losartan (COZAAR) 50 MG tablet Take 1 tablet by mouth daily.   05/18/2015 at Unknown time  . metFORMIN (GLUCOPHAGE-XR) 500 MG 24 hr tablet Take 500 mg by mouth at bedtime.    05/18/2015 at Unknown time  . Misc. Devices MISC CPAP   05/18/2015 at Unknown time  . naproxen sodium (ANAPROX) 220 MG tablet Take 220 mg by mouth daily as needed (pain).   05/18/2015 at Unknown time  . Omega 3 1000 MG CAPS Take 1,000 mg by mouth daily.   05/18/2015 at Unknown time  . pravastatin (PRAVACHOL) 20 MG tablet Take 20 mg by mouth. Takes 3 times a week   05/18/2015 at Unknown time  . Probiotic CAPS Take 1 capsule by mouth daily.   05/18/2015 at Unknown time  . tamsulosin (FLOMAX) 0.4 MG CAPS capsule Take 1 capsule (0.4 mg total) by mouth daily. 7 capsule 0 05/18/2015 at Unknown time   Allergies:  Allergies  Allergen Reactions  . Aspirin Other (See Comments)    Abdominal pain  . Benzocaine   . Caffeine     Panic attacks  .  Codeine Swelling  . Lidocaine-Epinephrine Other (See Comments)  . Oxycodone-Acetaminophen Swelling  . Percocet [Oxycodone-Acetaminophen] Swelling  . Procaine Hcl   . Pseudoephedrine Hcl Er Other (See Comments)    Jittery, nervousness  . Simvastatin     REACTION: hair loss    Family History  Problem Relation Age of Onset  . Hypertension Other   . Kidney disease Other   . Stroke Other   . Heart attack Other   . Cancer Other   . Obesity Other   . Hyperlipidemia Mother   . Hypertension Mother   . Heart disease Mother   . Macular degeneration Father   . Cancer Sister     colon  . Hypertension Sister    Social History:  reports that she quit smoking about 39 years ago. Her smoking use included Cigarettes. She started smoking about 49 years ago. She has never used smokeless tobacco. She reports that she drinks about 1.8 oz of alcohol per week. She reports that she  does not use illicit drugs.  Review of Systems  Gastrointestinal: Positive for nausea and vomiting.  Genitourinary: Positive for dysuria, urgency, frequency, hematuria and flank pain.  All other systems reviewed and are negative.   Physical Exam:  Vital signs in last 24 hours: Temp:  [98 F (36.7 C)] 98 F (36.7 C) (01/06 1415) Physical Exam  Constitutional: She is oriented to person, place, and time. She appears well-developed and well-nourished.  HENT:  Head: Normocephalic and atraumatic.  Eyes: EOM are normal. Pupils are equal, round, and reactive to light.  Neck: Normal range of motion. No thyromegaly present.  Cardiovascular: Normal rate and regular rhythm.   Respiratory: Effort normal. No respiratory distress.  GI: Soft. She exhibits no distension and no mass. There is no tenderness. There is no rebound and no guarding.  Musculoskeletal: Normal range of motion.  Neurological: She is alert and oriented to person, place, and time.  Skin: Skin is warm and dry.  Psychiatric: She has a normal mood and affect. Her behavior is normal. Judgment and thought content normal.    Laboratory Data:  Results for orders placed or performed during the hospital encounter of 05/19/15 (from the past 24 hour(s))  Glucose, capillary     Status: None   Collection Time: 05/19/15  2:35 PM  Result Value Ref Range   Glucose-Capillary 96 65 - 99 mg/dL   Recent Results (from the past 240 hour(s))  Urine culture     Status: None (Preliminary result)   Collection Time: 05/18/15  1:28 PM  Result Value Ref Range Status   Specimen Description URINE, CLEAN CATCH  Final   Special Requests NONE  Final   Culture   Final    TOO YOUNG TO READ Performed at [facility]    Report Status PENDING  Incomplete   Creatinine:  Recent Labs  05/13/15 1329 05/18/15 0801  CREATININE 0.89 1.18*   Baseline Creatinine: unknown  Impression/Assessment:  69yo with left ureteral calculus, intractable  pain  Plan:  The risks/benefits/alternatives to cystoscopy, left retrograde, left stone extraction with laser and left stent placement was explained to the patient and she understands and wishes to proceed with surgery  Julie Owen L 05/19/2015, 4:27 PM

## 2015-05-19 NOTE — Transfer of Care (Signed)
Immediate Anesthesia Transfer of Care Note  Patient: Julie Owen  Procedure(s) Performed: Procedure(s): CYSTOSCOPY WITH RETROGRADE PYELOGRAM, URETEROSCOPY AND STENT PLACEMENT (Left) STONE EXTRACTION WITH BASKET (Left) HOLMIUM LASER APPLICATION (Left)  Patient Location: PACU  Anesthesia Type:General  Level of Consciousness: awake, alert , oriented and patient cooperative  Airway & Oxygen Therapy: Patient Spontanous Breathing and Patient connected to face mask oxygen  Post-op Assessment: Report given to RN and Post -op Vital signs reviewed and stable  Post vital signs: Reviewed and stable  Last Vitals:  Filed Vitals:   05/19/15 1625 05/19/15 1630  BP: 135/68 141/64  Temp:    Resp: 17 17    Complications: No apparent anesthesia complications

## 2015-05-19 NOTE — Discharge Instructions (Signed)
Oxybutynin extended-release tablets What is this medicine? OXYBUTYNIN (ox i BYOO ti nin) is used to treat overactive bladder. This medicine reduces the amount of bathroom visits. It may also help to control wetting accidents. This medicine may be used for other purposes; ask your health care provider or pharmacist if you have questions. What should I tell my health care provider before I take this medicine? They need to know if you have any of these conditions: -autonomic neuropathy -dementia -difficulty passing urine -glaucoma -intestinal obstruction -kidney disease -liver disease -myasthenia gravis -Parkinson's disease -an unusual or allergic reaction to oxybutynin, other medicines, foods, dyes, or preservatives -pregnant or trying to get pregnant -breast-feeding How should I use this medicine? Take this medicine by mouth with a glass of water. Swallow whole, do not crush, cut, or chew. Follow the directions on the prescription label. You can take this medicine with or without food. Take your doses at regular intervals. Do not take your medicine more often than directed. Talk to your pediatrician regarding the use of this medicine in children. Special care may be needed. While this drug may be prescribed for children as young as 6 years for selected conditions, precautions do apply. Overdosage: If you think you have taken too much of this medicine contact a poison control center or emergency room at once. NOTE: This medicine is only for you. Do not share this medicine with others. What if I miss a dose? If you miss a dose, take it as soon as you can. If it is almost time for your next dose, take only that dose. Do not take double or extra doses. What may interact with this medicine? -antihistamines for allergy, cough and cold -atropine -certain medicines for bladder problems like oxybutynin, tolterodine -certain medicines for Parkinson's disease like benztropine,  trihexyphenidyl -certain medicines for stomach problems like dicyclomine, hyoscyamine -certain medicines for travel sickness like scopolamine -clarithromycin -erythromycin -ipratropium -medicines for fungal infections, like fluconazole, itraconazole, ketoconazole or voriconazole This list may not describe all possible interactions. Give your health care provider a list of all the medicines, herbs, non-prescription drugs, or dietary supplements you use. Also tell them if you smoke, drink alcohol, or use illegal drugs. Some items may interact with your medicine. What should I watch for while using this medicine? It may take a few weeks to notice the full benefit from this medicine. You may need to limit your intake tea, coffee, caffeinated sodas, and alcohol. These drinks may make your symptoms worse. You may get drowsy or dizzy. Do not drive, use machinery, or do anything that needs mental alertness until you know how this medicine affects you. Do not stand or sit up quickly, especially if you are an older patient. This reduces the risk of dizzy or fainting spells. Alcohol may interfere with the effect of this medicine. Avoid alcoholic drinks. Your mouth may get dry. Chewing sugarless gum or sucking hard candy, and drinking plenty of water may help. Contact your doctor if the problem does not go away or is severe. This medicine may cause dry eyes and blurred vision. If you wear contact lenses, you may feel some discomfort. Lubricating drops may help. See your eyecare professional if the problem does not go away or is severe. You may notice the shells of the tablets in your stool from time to time. This is normal. Avoid extreme heat. This medicine can cause you to sweat less than normal. Your body temperature could increase to dangerous levels, which may lead to  heat stroke. What side effects may I notice from receiving this medicine? Side effects that you should report to your doctor or health care  professional as soon as possible: -allergic reactions like skin rash, itching or hives, swelling of the face, lips, or tongue -agitation -breathing problems -confusion -fever -flushing (reddening of the skin) -hallucinations -memory loss -pain or difficulty passing urine -palpitations -unusually weak or tired Side effects that usually do not require medical attention (report to your doctor or health care professional if they continue or are bothersome): -constipation -headache -sexual difficulties (impotence) This list may not describe all possible side effects. Call your doctor for medical advice about side effects. You may report side effects to FDA at 1-800-FDA-1088. Where should I keep my medicine? Keep out of the reach of children. Store at room temperature between 15 and 30 degrees C (59 and 86 degrees F). Protect from moisture and humidity. Throw away any unused medicine after the expiration date. NOTE: This sheet is a summary. It may not cover all possible information. If you have questions about this medicine, talk to your doctor, pharmacist, or health care provider.    2016, Elsevier/Gold Standard. (2013-07-15 10:57:06) Phenazopyridine tablets What is this medicine? PHENAZOPYRIDINE (fen az oh PEER i deen) is a pain reliever. It is used to stop the pain, burning, or discomfort caused by infection or irritation of the urinary tract. This medicine is not an antibiotic. It will not cure a urinary tract infection. This medicine may be used for other purposes; ask your health care provider or pharmacist if you have questions. What should I tell my health care provider before I take this medicine? They need to know if you have any of these conditions: -glucose-6-phosphate dehydrogenase (G6PD) deficiency -kidney disease -an unusual or allergic reaction to phenazopyridine, other medicines, foods, dyes, or preservatives -pregnant or trying to get pregnant -breast-feeding How should  I use this medicine? Take this medicine by mouth with a glass of water. Follow the directions on the prescription label. Take after meals. Take your doses at regular intervals. Do not take your medicine more often than directed. Do not skip doses or stop your medicine early even if you feel better. Do not stop taking except on your doctor's advice. Talk to your pediatrician regarding the use of this medicine in children. Special care may be needed. Overdosage: If you think you have taken too much of this medicine contact a poison control center or emergency room at once. NOTE: This medicine is only for you. Do not share this medicine with others. What if I miss a dose? If you miss a dose, take it as soon as you can. If it is almost time for your next dose, take only that dose. Do not take double or extra doses. What may interact with this medicine? Interactions are not expected. This list may not describe all possible interactions. Give your health care provider a list of all the medicines, herbs, non-prescription drugs, or dietary supplements you use. Also tell them if you smoke, drink alcohol, or use illegal drugs. Some items may interact with your medicine. What should I watch for while using this medicine? Tell your doctor or health care professional if your symptoms do not improve or if they get worse. This medicine colors body fluids red. This effect is harmless and will go away after you are done taking the medicine. It will change urine to an dark orange or red color. The red color may stain clothing. Soft contact  lenses may become permanently stained. It is best not to wear soft contact lenses while taking this medicine. If you are diabetic you may get a false positive result for sugar in your urine. Talk to your health care provider. What side effects may I notice from receiving this medicine? Side effects that you should report to your doctor or health care professional as soon as  possible: -allergic reactions like skin rash, itching or hives, swelling of the face, lips, or tongue -blue or purple color of the skin -difficulty breathing -fever -less urine -unusual bleeding, bruising -unusual tired, weak -vomiting -yellowing of the eyes or skin Side effects that usually do not require medical attention (report to your doctor or health care professional if they continue or are bothersome): -dark urine -headache -stomach upset This list may not describe all possible side effects. Call your doctor for medical advice about side effects. You may report side effects to FDA at 1-800-FDA-1088. Where should I keep my medicine? Keep out of the reach of children. Store at room temperature between 15 and 30 degrees C (59 and 86 degrees F). Protect from light and moisture. Throw away any unused medicine after the expiration date. NOTE: This sheet is a summary. It may not cover all possible information. If you have questions about this medicine, talk to your doctor, pharmacist, or health care provider.    2016, Elsevier/Gold Standard. (2007-11-26 11:04:07) Cystoscopy, Care After Refer to this sheet in the next few weeks. These instructions provide you with information on caring for yourself after your procedure. Your caregiver may also give you more specific instructions. Your treatment has been planned according to current medical practices, but problems sometimes occur. Call your caregiver if you have any problems or questions after your procedure. HOME CARE INSTRUCTIONS  Things you can do to ease any discomfort after your procedure include:  Drinking enough water and fluids to keep your urine clear or pale yellow.  Taking a warm bath to relieve any burning feelings. SEEK IMMEDIATE MEDICAL CARE IF:   You have an increase in blood in your urine.  You notice blood clots in your urine.  You have difficulty passing urine.  You have the chills.  You have abdominal  pain.  You have a fever or persistent symptoms for more than 2-3 days.  You have a fever and your symptoms suddenly get worse. MAKE SURE YOU:   Understand these instructions.  Will watch your condition.  Will get help right away if you are not doing well or get worse.   This information is not intended to replace advice given to you by your health care provider. Make sure you discuss any questions you have with your health care provider.   Document Released: 11/16/2004 Document Revised: 05/20/2014 Document Reviewed: 10/21/2011 Elsevier Interactive Patient Education [employer].

## 2015-05-19 NOTE — Anesthesia Procedure Notes (Signed)
Procedure Name: LMA Insertion Date/Time: 05/19/2015 4:44 PM Performed by: Andree Elk, Amand Lemoine A Pre-anesthesia Checklist: Timeout performed, Patient identified, Emergency Drugs available, Suction available and Patient being monitored Patient Re-evaluated:Patient Re-evaluated prior to inductionOxygen Delivery Method: Circle system utilized Preoxygenation: Pre-oxygenation with 100% oxygen Intubation Type: IV induction Ventilation: Mask ventilation without difficulty LMA: LMA inserted LMA Size: 4.0 Number of attempts: 1 Placement Confirmation: positive ETCO2 and breath sounds checked- equal and bilateral Tube secured with: Tape Dental Injury: Teeth and Oropharynx as per pre-operative assessment

## 2015-05-19 NOTE — Anesthesia Preprocedure Evaluation (Addendum)
Anesthesia Evaluation  Patient identified by MRN, date of birth, ID band Patient awake    Reviewed: Allergy & Precautions, NPO status , Patient's Chart, lab work & pertinent test results  Airway Mallampati: III  TM Distance: >3 FB    Comment: Prior hx grade IV view with Miller, Grade II with Glidescope. Dental  (+) Teeth Intact, Dental Advisory Given   Pulmonary sleep apnea and Continuous Positive Airway Pressure Ventilation , former smoker,    breath sounds clear to auscultation       Cardiovascular hypertension, Pt. on medications  Rhythm:Regular Rate:Normal     Neuro/Psych    GI/Hepatic   Endo/Other  diabetes, Type 2, Oral Hypoglycemic Agents  Renal/GU Renal disease     Musculoskeletal   Abdominal   Peds  Hematology   Anesthesia Other Findings   Reproductive/Obstetrics                            Anesthesia Physical Anesthesia Plan  ASA: III  Anesthesia Plan: General   Post-op Pain Management:    Induction: Intravenous  Airway Management Planned: LMA  Additional Equipment:   Intra-op Plan:   Post-operative Plan: Extubation in OR  Informed Consent: I have reviewed the patients History and Physical, chart, labs and discussed the procedure including the risks, benefits and alternatives for the proposed anesthesia with the patient or authorized representative who has indicated his/her understanding and acceptance.     Plan Discussed with:   Anesthesia Plan Comments:         Anesthesia Quick Evaluation

## 2015-05-21 NOTE — Op Note (Signed)
.  Preoperative diagnosis: Left ureteral stone  Postoperative diagnosis: Same  Procedure: 1 cystoscopy 2. Left retrograde pyelography 3.  Intraoperative fluoroscopy, under one hour, with interpretation 4.  Left ureteroscopic stone manipulation with basket extraction 5.  Left 6 x 26 JJ stent placement  Attending: Rosie Fate  Anesthesia: General  Estimated blood loss: None  Drains: Left 6 x 26 JJ ureteral stent without tether  Specimens: none  Antibiotics: ancef  Findings: left proximal ureteral stone. Moderate hydronephrosis. No masses/lesions in the bladder. Ureteral orifices in normal anatomic location.  Indications: Patient is a 70 year old female with a history of left ureteral stone and who has persistent left flank pain.  After discussing treatment options, she decided proceed with left ureteroscopic stone manipulation.  Procedure her in detail: The patient was brought to the operating room and a brief timeout was done to ensure correct patient, correct procedure, correct site.  General anesthesia was administered patient was placed in dorsal lithotomy position.  Her genitalia was then prepped and draped in usual sterile fashion.  A rigid 56 French cystoscope was passed in the urethra and the bladder.  Bladder was inspected free masses or lesions.  the ureteral orifices were in the normal orthotopic locations.  a 6 french ureteral catheter was then instilled into the left ureteral orifice.  a gentle retrograde was obtained and findings noted above.  we then placed a zip wire through the ureteral catheter and advanced up to the renal pelvis.  we then removed the cystoscope and cannulated the left ureteral orifice with a semirigid ureteroscope.  The stone was encountered at the UPJ. Using an Ngage basket we attempted to remove the stone, however the stone was brittle and multiple fragments went in to the kidney. We removed several small fragments with the NGage basket.   once all  stone fragments were removed we then placed a 6 x 26 double-j ureteral stent over the original zip wire. We  Removed the wire and good coil was noted in the the renal pelvis under fluoroscopy and the bladder under direct vision.  the bladder was then drained and this concluded the procedure which was well tolerated by patient.  Complications: None  Condition: Stable, extubated, transferred to PACU  Plan: Patient is to be discharged home as to follow-up in 2 weeks for repeat stone extraction.

## 2015-05-22 ENCOUNTER — Encounter (HOSPITAL_COMMUNITY): Payer: Self-pay | Admitting: Urology

## 2015-05-25 ENCOUNTER — Other Ambulatory Visit: Payer: Self-pay | Admitting: Urology

## 2015-05-26 NOTE — Patient Instructions (Signed)
Julie Owen  05/26/2015     @PREFPERIOPPHARMACY @   Your procedure is scheduled on 05/31/2015.  Report to [facility] at 9:30 A.M.  Call this number if you have problems the morning of surgery:  [phone]   Remember:  Do not eat food or drink liquids after midnight.  Take these medicines the morning of surgery with A SIP OF WATER :  FLONASE, DILAUDID, COZAAR AND FLOMAX   Do not wear jewelry, make-up or nail polish.  Do not wear lotions, powders, or perfumes.  You may wear deodorant.  Do not shave 48 hours prior to surgery.  Men may shave face and neck.  Do not bring valuables to the hospital.  [facility] is not responsible for any belongings or valuables.  Contacts, dentures or bridgework may not be worn into surgery.  Leave your suitcase in the car.  After surgery it may be brought to your room.  For patients admitted to the hospital, discharge time will be determined by your treatment team.  Patients discharged the day of surgery will not be allowed to drive home.   Name and phone number of your driver:   FAMILY Special instructions:  N/A  Please read over the following fact sheets that you were given. Care and Recovery After Surgery   Cystoscopy Cystoscopy is a procedure that is used to help your caregiver diagnose and sometimes treat conditions that affect your lower urinary tract. Your lower urinary tract includes your bladder and the tube through which urine passes from your bladder out of your body (urethra). Cystoscopy is performed with a thin, tube-shaped instrument (cystoscope). The cystoscope has lenses and a light at the end so that your caregiver can see inside your bladder. The cystoscope is inserted at the entrance of your urethra. Your caregiver guides it through your urethra and into your bladder. There are two main types of cystoscopy:  Flexible cystoscopy (with a flexible cystoscope).  Rigid cystoscopy (with a rigid cystoscope). Cystoscopy may be  recommended for many conditions, including:  Urinary tract infections.  Blood in your urine (hematuria).  Loss of bladder control (urinary incontinence) or overactive bladder.  Unusual cells found in a urine sample.  Urinary blockage.  Painful urination. Cystoscopy may also be done to remove a sample of your tissue to be checked under a microscope (biopsy). It may also be done to remove or destroy bladder stones. LET YOUR CAREGIVER KNOW ABOUT:  Allergies to food or medicine.  Medicines taken, including vitamins, herbs, eyedrops, over-the-counter medicines, and creams.  Use of steroids (by mouth or creams).  Previous problems with anesthetics or numbing medicines.  History of bleeding problems or blood clots.  Previous surgery.  Other health problems, including diabetes and kidney problems.  Possibility of pregnancy, if this applies. PROCEDURE The area around the opening to your urethra will be cleaned. A medicine to numb your urethra (local anesthetic) is used. If a tissue sample or stone is removed during the procedure, you may be given a medicine to make you sleep (general anesthetic). Your caregiver will gently insert the tip of the cystoscope into your urethra. The cystoscope will be slowly glided through your urethra and into your bladder. Sterile fluid will flow through the cystoscope and into your bladder. The fluid will expand and stretch your bladder. This gives your caregiver a better view of your bladder walls. The procedure lasts about 15-20 minutes. AFTER THE PROCEDURE If a local anesthetic is used, you will be allowed to  go home as soon as you are ready. If a general anesthetic is used, you will be taken to a recovery area until you are stable. You may have temporary bleeding and burning on urination.   This information is not intended to replace advice given to you by your health care provider. Make sure you discuss any questions you have with your health care  provider.   Document Released: 04/26/2000 Document Revised: 05/20/2014 Document Reviewed: 10/21/2011 Elsevier Interactive Patient Education [employer].

## 2015-05-29 ENCOUNTER — Encounter (HOSPITAL_COMMUNITY): Payer: Self-pay

## 2015-05-29 ENCOUNTER — Encounter (HOSPITAL_COMMUNITY)
Admission: RE | Admit: 2015-05-29 | Discharge: 2015-05-29 | Disposition: A | Discharge status: Home / Self Care | Payer: Medicare Other | Source: Ambulatory Visit | Attending: Urology | Admitting: Urology

## 2015-05-29 DIAGNOSIS — N200 Calculus of kidney: Secondary | ICD-10-CM | POA: Diagnosis not present

## 2015-05-29 DIAGNOSIS — E785 Hyperlipidemia, unspecified: Secondary | ICD-10-CM | POA: Diagnosis not present

## 2015-05-29 DIAGNOSIS — E119 Type 2 diabetes mellitus without complications: Secondary | ICD-10-CM | POA: Diagnosis not present

## 2015-05-29 DIAGNOSIS — Z87891 Personal history of nicotine dependence: Secondary | ICD-10-CM | POA: Diagnosis not present

## 2015-05-29 DIAGNOSIS — Z79899 Other long term (current) drug therapy: Secondary | ICD-10-CM | POA: Diagnosis not present

## 2015-05-29 DIAGNOSIS — N201 Calculus of ureter: Secondary | ICD-10-CM | POA: Diagnosis present

## 2015-05-29 DIAGNOSIS — I10 Essential (primary) hypertension: Secondary | ICD-10-CM | POA: Diagnosis not present

## 2015-05-29 DIAGNOSIS — E669 Obesity, unspecified: Secondary | ICD-10-CM | POA: Diagnosis not present

## 2015-05-29 DIAGNOSIS — Z6830 Body mass index (BMI) 30.0-30.9, adult: Secondary | ICD-10-CM | POA: Diagnosis not present

## 2015-05-29 DIAGNOSIS — Z7984 Long term (current) use of oral hypoglycemic drugs: Secondary | ICD-10-CM | POA: Diagnosis not present

## 2015-05-29 HISTORY — DX: Sleep apnea, unspecified: G47.30

## 2015-05-29 NOTE — Pre-Procedure Instructions (Signed)
Patient given information to sign up for my chart at home. 

## 2015-05-31 ENCOUNTER — Ambulatory Visit (HOSPITAL_COMMUNITY): Payer: Medicare Other | Admitting: Anesthesiology

## 2015-05-31 ENCOUNTER — Ambulatory Visit (HOSPITAL_COMMUNITY)
Admission: RE | Admit: 2015-05-31 | Discharge: 2015-05-31 | Disposition: A | Discharge status: Home / Self Care | Payer: Medicare Other | Source: Ambulatory Visit | Attending: Urology | Admitting: Urology

## 2015-05-31 ENCOUNTER — Ambulatory Visit (HOSPITAL_COMMUNITY): Payer: Medicare Other

## 2015-05-31 ENCOUNTER — Encounter (HOSPITAL_COMMUNITY): Payer: Self-pay | Admitting: Anesthesiology

## 2015-05-31 ENCOUNTER — Encounter (HOSPITAL_COMMUNITY)
Admission: RE | Disposition: A | Discharge status: Home / Self Care | Payer: Self-pay | Source: Ambulatory Visit | Attending: Urology

## 2015-05-31 DIAGNOSIS — I10 Essential (primary) hypertension: Secondary | ICD-10-CM | POA: Diagnosis not present

## 2015-05-31 DIAGNOSIS — Z6830 Body mass index (BMI) 30.0-30.9, adult: Secondary | ICD-10-CM | POA: Insufficient documentation

## 2015-05-31 DIAGNOSIS — Z79899 Other long term (current) drug therapy: Secondary | ICD-10-CM | POA: Insufficient documentation

## 2015-05-31 DIAGNOSIS — N201 Calculus of ureter: Secondary | ICD-10-CM | POA: Diagnosis not present

## 2015-05-31 DIAGNOSIS — N200 Calculus of kidney: Secondary | ICD-10-CM | POA: Diagnosis not present

## 2015-05-31 DIAGNOSIS — Z7984 Long term (current) use of oral hypoglycemic drugs: Secondary | ICD-10-CM | POA: Insufficient documentation

## 2015-05-31 DIAGNOSIS — E119 Type 2 diabetes mellitus without complications: Secondary | ICD-10-CM | POA: Insufficient documentation

## 2015-05-31 DIAGNOSIS — Z87891 Personal history of nicotine dependence: Secondary | ICD-10-CM | POA: Insufficient documentation

## 2015-05-31 DIAGNOSIS — E785 Hyperlipidemia, unspecified: Secondary | ICD-10-CM | POA: Insufficient documentation

## 2015-05-31 DIAGNOSIS — E669 Obesity, unspecified: Secondary | ICD-10-CM | POA: Insufficient documentation

## 2015-05-31 HISTORY — PX: CYSTOSCOPY/RETROGRADE/URETEROSCOPY/STONE EXTRACTION WITH BASKET: SHX5317

## 2015-05-31 HISTORY — PX: CYSTOSCOPY W/ URETERAL STENT PLACEMENT: SHX1429

## 2015-05-31 LAB — GLUCOSE, CAPILLARY
GLUCOSE-CAPILLARY: 145 mg/dL — AB (ref 65–99)
Glucose-Capillary: 107 mg/dL — ABNORMAL HIGH (ref 65–99)

## 2015-05-31 SURGERY — CYSTOSCOPY, WITH CALCULUS REMOVAL USING BASKET
Anesthesia: General | Laterality: Left

## 2015-05-31 MED ORDER — IOHEXOL 350 MG/ML SOLN
INTRAVENOUS | Status: DC | PRN
  Administered 2015-05-31: 5 mL via URETHRAL

## 2015-05-31 MED ORDER — FENTANYL CITRATE (PF) 100 MCG/2ML IJ SOLN
INTRAMUSCULAR | Status: AC
  Filled 2015-05-31: qty 2

## 2015-05-31 MED ORDER — DEXTROSE 5 % IV SOLN
2.0000 g | INTRAVENOUS | Status: AC
  Administered 2015-05-31: 1 g via INTRAVENOUS
  Filled 2015-05-31: qty 2

## 2015-05-31 MED ORDER — SULFAMETHOXAZOLE-TRIMETHOPRIM 800-160 MG PO TABS: 1.0000 | ORAL_TABLET | Freq: Two times a day (BID) | ORAL | Status: DC

## 2015-05-31 MED ORDER — MIDAZOLAM HCL 2 MG/2ML IJ SOLN
INTRAMUSCULAR | Status: AC
  Filled 2015-05-31: qty 2

## 2015-05-31 MED ORDER — HYDROMORPHONE HCL 4 MG PO TABS: 4.0000 mg | ORAL_TABLET | Freq: Four times a day (QID) | ORAL | Status: DC | PRN

## 2015-05-31 MED ORDER — FENTANYL CITRATE (PF) 100 MCG/2ML IJ SOLN
25.0000 ug | INTRAMUSCULAR | Status: DC | PRN
  Administered 2015-05-31 (×4): 50 ug via INTRAVENOUS
  Filled 2015-05-31: qty 2

## 2015-05-31 MED ORDER — ONDANSETRON HCL 4 MG/2ML IJ SOLN: 4.0000 mg | Freq: Once | INTRAMUSCULAR | Status: DC | PRN

## 2015-05-31 MED ORDER — SUCCINYLCHOLINE CHLORIDE 20 MG/ML IJ SOLN
INTRAMUSCULAR | Status: AC
  Filled 2015-05-31: qty 1

## 2015-05-31 MED ORDER — PROPOFOL 10 MG/ML IV BOLUS
INTRAVENOUS | Status: DC | PRN
  Administered 2015-05-31: 150 mg via INTRAVENOUS

## 2015-05-31 MED ORDER — LIDOCAINE HCL 1 % IJ SOLN
INTRAMUSCULAR | Status: DC | PRN
  Administered 2015-05-31: 30 mg via INTRADERMAL

## 2015-05-31 MED ORDER — LACTATED RINGERS IV SOLN
INTRAVENOUS | Status: DC
  Administered 2015-05-31 (×2): via INTRAVENOUS

## 2015-05-31 MED ORDER — PROPOFOL 10 MG/ML IV BOLUS
INTRAVENOUS | Status: AC
  Filled 2015-05-31: qty 20

## 2015-05-31 MED ORDER — EPHEDRINE SULFATE 50 MG/ML IJ SOLN
INTRAMUSCULAR | Status: DC | PRN
  Administered 2015-05-31: 10 mg via INTRAVENOUS

## 2015-05-31 MED ORDER — FENTANYL CITRATE (PF) 250 MCG/5ML IJ SOLN
INTRAMUSCULAR | Status: DC | PRN
  Administered 2015-05-31 (×4): 50 ug via INTRAVENOUS

## 2015-05-31 MED ORDER — ROCURONIUM BROMIDE 50 MG/5ML IV SOLN
INTRAVENOUS | Status: AC
  Filled 2015-05-31: qty 1

## 2015-05-31 MED ORDER — FENTANYL CITRATE (PF) 250 MCG/5ML IJ SOLN
INTRAMUSCULAR | Status: AC
  Filled 2015-05-31: qty 5

## 2015-05-31 MED ORDER — MIDAZOLAM HCL 2 MG/2ML IJ SOLN
1.0000 mg | INTRAMUSCULAR | Status: DC | PRN
  Administered 2015-05-31: 2 mg via INTRAVENOUS

## 2015-05-31 MED ORDER — SODIUM CHLORIDE 0.9 % IR SOLN
Status: DC | PRN
  Administered 2015-05-31 (×2): 3000 mL

## 2015-05-31 MED ORDER — ROCURONIUM BROMIDE 100 MG/10ML IV SOLN
INTRAVENOUS | Status: DC | PRN
  Administered 2015-05-31: 5 mg via INTRAVENOUS

## 2015-05-31 MED ORDER — BELLADONNA ALKALOIDS-OPIUM 16.2-60 MG RE SUPP
1.0000 | Freq: Once | RECTAL | Status: AC
  Administered 2015-05-31: 1 via RECTAL
  Filled 2015-05-31: qty 1

## 2015-05-31 MED ORDER — ONDANSETRON HCL 4 MG/2ML IJ SOLN
INTRAMUSCULAR | Status: AC
  Filled 2015-05-31: qty 2

## 2015-05-31 MED ORDER — ONDANSETRON HCL 4 MG/2ML IJ SOLN
4.0000 mg | Freq: Once | INTRAMUSCULAR | Status: AC
  Administered 2015-05-31: 4 mg via INTRAVENOUS

## 2015-05-31 MED ORDER — SUCCINYLCHOLINE CHLORIDE 20 MG/ML IJ SOLN
INTRAMUSCULAR | Status: DC | PRN
  Administered 2015-05-31: 100 mg via INTRAVENOUS

## 2015-05-31 SURGICAL SUPPLY — 26 items
BAG HAMPER (MISCELLANEOUS) ×3 IMPLANT
BAG URO CATCHER STRL LF (MISCELLANEOUS) ×3 IMPLANT
BASKET LASER NITINOL 1.9FR (BASKET) IMPLANT
BASKET ZERO TIP NITINOL 2.4FR (BASKET) IMPLANT
CATH INTERMIT  6FR 70CM (CATHETERS) ×3 IMPLANT
CLOTH BEACON ORANGE TIMEOUT ST (SAFETY) ×3 IMPLANT
EXTRACTOR STONE NITINOL NGAGE (UROLOGICAL SUPPLIES) ×3 IMPLANT
FIBER LASER FLEXIVA 365 (UROLOGICAL SUPPLIES) IMPLANT
GLOVE BIO SURGEON STRL SZ8 (GLOVE) ×3 IMPLANT
GOWN STRL REUS W/ TWL LRG LVL3 (GOWN DISPOSABLE) ×1 IMPLANT
GOWN STRL REUS W/ TWL XL LVL3 (GOWN DISPOSABLE) ×1 IMPLANT
GOWN STRL REUS W/TWL LRG LVL3 (GOWN DISPOSABLE) ×2
GOWN STRL REUS W/TWL XL LVL3 (GOWN DISPOSABLE) ×2
GUIDEWIRE ANG ZIPWIRE 038X150 (WIRE) ×3 IMPLANT
GUIDEWIRE STR DUAL SENSOR (WIRE) ×3 IMPLANT
IV NS IRRIG 3000ML ARTHROMATIC (IV SOLUTION) ×6 IMPLANT
LASER FIBER DISP (UROLOGICAL SUPPLIES) IMPLANT
MANIFOLD NEPTUNE II (INSTRUMENTS) ×3 IMPLANT
PACK CYSTO (CUSTOM PROCEDURE TRAY) ×3 IMPLANT
PAD ARMBOARD 7.5X6 YLW CONV (MISCELLANEOUS) ×3 IMPLANT
SHEATH ACCESS URETERAL 38CM (SHEATH) ×3 IMPLANT
STENT URET 6FRX24 CONTOUR (STENTS) ×3 IMPLANT
STENT URET 6FRX26 CONTOUR (STENTS) ×3 IMPLANT
SYRINGE 10CC LL (SYRINGE) ×3 IMPLANT
TUBE FEEDING 8FR 16IN STR KANG (MISCELLANEOUS) IMPLANT
WATER STERILE IRR 1000ML POUR (IV SOLUTION) ×3 IMPLANT

## 2015-05-31 NOTE — H&P (View-Only) (Signed)
Urology Admission H&P  Chief Complaint: left flank pain  History of Present Illness: Julie Owen is a 69yo with new onset left flank pain,. She was diagnosed with a 4mm left proximal ureteral calculus on 12/29 and has failed MET. She has associated nause and vomiting. She has severe, intermittent, nonradiaitng left flank pain  Past Medical History  Diagnosis Date  . Hyperlipidemia   . Prediabetes   . Hypertension   . Obesity   . Osteoarthritis of lumbar spine   . Osteoarthritis of thumb     bilateral  . Atrial septal defect   . Hip pain, right   . Diabetes mellitus without complication (HCC)   . Renal disorder     kidney stones   Past Surgical History  Procedure Laterality Date  . Total hip arthroplasty  08/2010    Right hip  . Breast surgery  2008    Right breast  . Bunionectomy  1993    Right foot  . Breast reduction surgery    . Tonsillectomy    . Eyelid surgery      Home Medications:  Prescriptions prior to admission  Medication Sig Dispense Refill Last Dose  . Alpha-Lipoic Acid 600 MG CAPS Take 600 mg by mouth at bedtime.   05/18/2015 at Unknown time  . BENZONATATE PO Take 1 capsule by mouth daily as needed (cough).   05/18/2015 at Unknown time  . Cholecalciferol (VITAMIN D3) 2000 UNITS TABS Take 1 tablet by mouth daily.   05/18/2015 at Unknown time  . fluticasone (FLONASE) 50 MCG/ACT nasal spray Place 2 sprays into both nostrils daily as needed for allergies or rhinitis.   05/18/2015 at Unknown time  . guaiFENesin (ROBITUSSIN) 100 MG/5ML SOLN Take 5 mLs by mouth every 4 (four) hours as needed for cough or to loosen phlegm.   05/18/2015 at Unknown time  . HYDROmorphone (DILAUDID) 2 MG tablet Take 2 mg by mouth every 6 (six) hours as needed for moderate pain or severe pain.   05/18/2015 at Unknown time  . HYDROmorphone (DILAUDID) 4 MG tablet Take 1 tablet (4 mg total) by mouth every 6 (six) hours as needed for severe pain. 10 tablet 0 05/18/2015 at Unknown time  . ibuprofen  (ADVIL,MOTRIN) 200 MG tablet Take 400 mg by mouth every 6 (six) hours as needed for moderate pain.    05/18/2015 at Unknown time  . losartan (COZAAR) 50 MG tablet Take 1 tablet by mouth daily.   05/18/2015 at Unknown time  . metFORMIN (GLUCOPHAGE-XR) 500 MG 24 hr tablet Take 500 mg by mouth at bedtime.    05/18/2015 at Unknown time  . Misc. Devices MISC CPAP   05/18/2015 at Unknown time  . naproxen sodium (ANAPROX) 220 MG tablet Take 220 mg by mouth daily as needed (pain).   05/18/2015 at Unknown time  . Omega 3 1000 MG CAPS Take 1,000 mg by mouth daily.   05/18/2015 at Unknown time  . pravastatin (PRAVACHOL) 20 MG tablet Take 20 mg by mouth. Takes 3 times a week   05/18/2015 at Unknown time  . Probiotic CAPS Take 1 capsule by mouth daily.   05/18/2015 at Unknown time  . tamsulosin (FLOMAX) 0.4 MG CAPS capsule Take 1 capsule (0.4 mg total) by mouth daily. 7 capsule 0 05/18/2015 at Unknown time   Allergies:  Allergies  Allergen Reactions  . Aspirin Other (See Comments)    Abdominal pain  . Benzocaine   . Caffeine     Panic attacks  .   Codeine Swelling  . Lidocaine-Epinephrine Other (See Comments)  . Oxycodone-Acetaminophen Swelling  . Percocet [Oxycodone-Acetaminophen] Swelling  . Procaine Hcl   . Pseudoephedrine Hcl Er Other (See Comments)    Jittery, nervousness  . Simvastatin     REACTION: hair loss    Family History  Problem Relation Age of Onset  . Hypertension Other   . Kidney disease Other   . Stroke Other   . Heart attack Other   . Cancer Other   . Obesity Other   . Hyperlipidemia Mother   . Hypertension Mother   . Heart disease Mother   . Macular degeneration Father   . Cancer Sister     colon  . Hypertension Sister    Social History:  reports that she quit smoking about 39 years ago. Her smoking use included Cigarettes. She started smoking about 49 years ago. She has never used smokeless tobacco. She reports that she drinks about 1.8 oz of alcohol per week. She reports that she  does not use illicit drugs.  Review of Systems  Gastrointestinal: Positive for nausea and vomiting.  Genitourinary: Positive for dysuria, urgency, frequency, hematuria and flank pain.  All other systems reviewed and are negative.   Physical Exam:  Vital signs in last 24 hours: Temp:  [98 F (36.7 C)] 98 F (36.7 C) (01/06 1415) Physical Exam  Constitutional: She is oriented to person, place, and time. She appears well-developed and well-nourished.  HENT:  Head: Normocephalic and atraumatic.  Eyes: EOM are normal. Pupils are equal, round, and reactive to light.  Neck: Normal range of motion. No thyromegaly present.  Cardiovascular: Normal rate and regular rhythm.   Respiratory: Effort normal. No respiratory distress.  GI: Soft. She exhibits no distension and no mass. There is no tenderness. There is no rebound and no guarding.  Musculoskeletal: Normal range of motion.  Neurological: She is alert and oriented to person, place, and time.  Skin: Skin is warm and dry.  Psychiatric: She has a normal mood and affect. Her behavior is normal. Judgment and thought content normal.    Laboratory Data:  Results for orders placed or performed during the hospital encounter of 05/19/15 (from the past 24 hour(s))  Glucose, capillary     Status: None   Collection Time: 05/19/15  2:35 PM  Result Value Ref Range   Glucose-Capillary 96 65 - 99 mg/dL   Recent Results (from the past 240 hour(s))  Urine culture     Status: None (Preliminary result)   Collection Time: 05/18/15  1:28 PM  Result Value Ref Range Status   Specimen Description URINE, CLEAN CATCH  Final   Special Requests NONE  Final   Culture   Final    TOO YOUNG TO READ Performed at [facility]    Report Status PENDING  Incomplete   Creatinine:  Recent Labs  05/13/15 1329 05/18/15 0801  CREATININE 0.89 1.18*   Baseline Creatinine: unknown  Impression/Assessment:  69yo with left ureteral calculus, intractable  pain  Plan:  The risks/benefits/alternatives to cystoscopy, left retrograde, left stone extraction with laser and left stent placement was explained to the patient and she understands and wishes to proceed with surgery  Nickolai Rinks L 05/19/2015, 4:27 PM       

## 2015-05-31 NOTE — Anesthesia Postprocedure Evaluation (Signed)
Anesthesia Post Note  Patient: Julie Owen  Procedure(s) Performed: Procedure(s) (LRB): CYSTOSCOPY/RETROGRADE/STONE EXTRACTION WITH BASKET (Left) CYSTOSCOPY WITH STENT EXCHANGE (Left)  Patient location during evaluation: PACU Anesthesia Type: General Level of consciousness: awake and alert Pain management: pain level controlled Vital Signs Assessment: post-procedure vital signs reviewed and stable Respiratory status: spontaneous breathing Cardiovascular status: blood pressure returned to baseline Postop Assessment: no signs of nausea or vomiting Anesthetic complications: no    Last Vitals:  Filed Vitals:   05/31/15 1330 05/31/15 1335  BP: 148/111   Pulse: 83 88  Temp:    Resp: 19 15    Last Pain:  Filed Vitals:   05/31/15 1344  PainSc: 3                  Tasheena Wambolt

## 2015-05-31 NOTE — Op Note (Signed)
.  Preoperative diagnosis: left ureteral stone  Postoperative diagnosis: Same  Procedure: 1 cystoscopy 2. Left retrograde pyelography 3.  Intraoperative fluoroscopy, under one hour, with interpretation 4.  left ureteroscopic stone manipulation with basket extraction 5.  left 6 x 24 JJ stent exchange  Attending: Rosie Fate  Anesthesia: General  Estimated blood loss: None  Drains: left 6 x 24 JJ ureteral stent with tether  Specimens: stone  Antibiotics: rocephin  Findings: left proximal ureteral calculus. Mild left hydronephrosis. No masses/lesions in the bladder. Ureteral orifices in normal anatomic location.  Indications: Patient is a 70 year old female with a history of left proximal ureteral stone who is status post left stent placement. After discussing treatment options, they decided proceed with left ureteroscopic stone manipulation.  Procedure her in detail: The patient was brought to the operating room and a brief timeout was done to ensure correct patient, correct procedure, correct site.  General anesthesia was administered patient was placed in dorsal lithotomy position.  Her genitalia was then prepped and draped in usual sterile fashion.  A rigid 52 French cystoscope was passed in the urethra and the bladder.  Bladder was inspected free masses or lesions.  the ureteral orifices were in the normal orthotopic locations. Using a grasper the ureteral stent was brought to the urethral meatus. Through the stent a zipwire was advanced up to the renal pelvis. The stent was then removed. a 6 french ureteral catheter was then instilled into the left ureteral orifice.  a gentle retrograde was obtained and findings noted above.  we then removed the cystoscope and cannulated the left ureteral orifice with a semirigid ureteroscope.  We performed ureteroscopy and did not encounter a stone int he ureter. We then advanced a sensor wire through the scope and into the renal pelvis. We then  advanced a 12/14 x 38cm access sheath up to the renal pelvis. We used the flexible ureteroscope to perform nephroscopy. We encountered multiple fragments in the renal pelvis. Using an Ngage basket the fragments were removed. Once this was done we then removed the access sheath under direct vision and noted no injury to the ureter. We then placed a 6 x 24 double-j ureteral stent over the original zip wire. We then removed the wire and good coil was noted in the the renal pelvis under fluoroscopy and the bladder under direct vision.   the bladder was then drained and this concluded the procedure which was well tolerated by patient.  Complications: None  Condition: Stable, extubated, transferred to PACU  Plan: Patient is to be discharged home as to follow-up in 2 weeks. She is to remove her stent in 72 hours by pulling the string

## 2015-05-31 NOTE — Interval H&P Note (Signed)
History and Physical Interval Note:  05/31/2015 11:02 AM  Julie Owen  has presented today for surgery, with the diagnosis of left renal stone  The various methods of treatment have been discussed with the patient and family. After consideration of risks, benefits and other options for treatment, the patient has consented to  Procedure(s): CYSTOSCOPY/RETROGRADE/URETEROSCOPY/STONE EXTRACTION WITH BASKET (Left) HOLMIUM LASER APPLICATION (Left) CYSTOSCOPY WITH STENT EXCHANGE (Left) as a surgical intervention .  The patient's history has been reviewed, patient examined, no change in status, stable for surgery.  I have reviewed the patient's chart and labs.  Questions were answered to the patient's satisfaction.     [name] L

## 2015-05-31 NOTE — Transfer of Care (Signed)
Immediate Anesthesia Transfer of Care Note  Patient: Julie Owen  Procedure(s) Performed: Procedure(s): CYSTOSCOPY/RETROGRADE/STONE EXTRACTION WITH BASKET (Left) CYSTOSCOPY WITH STENT EXCHANGE (Left)  Patient Location: PACU  Anesthesia Type:General  Level of Consciousness: awake and alert   Airway & Oxygen Therapy: Patient Spontanous Breathing and Patient connected to face mask oxygen  Post-op Assessment: Report given to RN  Post vital signs: Reviewed and stable  Last Vitals:  Filed Vitals:   05/31/15 1100 05/31/15 1105  BP: 102/53 119/56  Pulse:    Temp:    Resp: 11 14    Complications: No apparent anesthesia complications

## 2015-05-31 NOTE — Discharge Instructions (Signed)
Ureteroscopy, Care After °Refer to this sheet in the next few weeks. These instructions provide you with information on caring for yourself after your procedure. Your health care provider may also give you more specific instructions. Your treatment has been planned according to current medical practices, but problems sometimes occur. Call your health care provider if you have any problems or questions after your procedure.  °WHAT TO EXPECT AFTER THE PROCEDURE  °After your procedure, it is typical to have the following:  °· A burning sensation when you urinate. °· Blood in your urine. °HOME CARE INSTRUCTIONS  °· Only take medicines as directed by your health care provider. Do not take any over-the-counter pain medication unless your health care provider says it is okay. °· Take a warm bath or hold a warm washcloth over your groin to relieve burning. °· Drink enough fluids to keep your urine clear or pale yellow. °¨ Drink two 8-ounce glasses of water every hour for the first 2 hours after you get home. °¨ Continue to drink water often at home. °· You can eat what you usually do. °· Ask your surgeon when you can do your usual activities. °· If you had a tube placed to keep urine flowing (ureteral stent), ask your health care provider when you need to return to have it removed. °· Keep all follow-up appointments. °SEEK MEDICAL CARE IF:  °· You have chills or fever. °· You have burning pain for longer than 24 hours after the procedure. °· You have blood in your urine for longer than 24 hours after the procedure. °SEEK IMMEDIATE MEDICAL CARE IF:  °· You have large amounts of blood or clots in your urine. °· You have very bad pain. °· You have chest pain or trouble breathing. °  °This information is not intended to replace advice given to you by your health care provider. Make sure you discuss any questions you have with your health care provider. °  °Document Released: 05/04/2013 Document Reviewed: 05/04/2013 °Elsevier  Interactive Patient Education ©2016 [employer] ° °

## 2015-05-31 NOTE — Brief Op Note (Signed)
05/31/2015  12:00 PM  PATIENT:  Julie Owen  70 y.o. female  PRE-OPERATIVE DIAGNOSIS:  left renal stone  POST-OPERATIVE DIAGNOSIS:  * No post-op diagnosis entered *  PROCEDURE:  Procedure(s): CYSTOSCOPY/RETROGRADE/STONE EXTRACTION WITH BASKET (Left) CYSTOSCOPY WITH STENT EXCHANGE (Left)  SURGEON:  Surgeon(s) and Role:    * Cleon Gustin, MD - Primary  PHYSICIAN ASSISTANT:   ASSISTANTS: none   ANESTHESIA:   general  EBL:  Total I/O In: 112.5 [I.V.:112.5] Out: -   BLOOD ADMINISTERED:none  DRAINS: left 6x24 JJ stent with tether  LOCAL MEDICATIONS USED:  NONE  SPECIMEN:  Source of Specimen:  stone  DISPOSITION OF SPECIMEN:  N/A  COUNTS:  YES  TOURNIQUET:  * No tourniquets in log *  DICTATION: .Note written in EPIC  PLAN OF CARE: Discharge to home after PACU  PATIENT DISPOSITION:  PACU - hemodynamically stable.   Delay start of Pharmacological VTE agent (>24hrs) due to surgical blood loss or risk of bleeding: not applicable

## 2015-05-31 NOTE — Anesthesia Preprocedure Evaluation (Signed)
Anesthesia Evaluation  Patient identified by MRN, date of birth, ID band Patient awake    Reviewed: Allergy & Precautions, NPO status , Patient's Chart, lab work & pertinent test results  Airway Mallampati: III  TM Distance: >3 FB    Comment: Prior hx grade IV view with Miller, Grade II with Glidescope. Dental  (+) Teeth Intact, Dental Advisory Given   Pulmonary sleep apnea and Continuous Positive Airway Pressure Ventilation , former smoker,    breath sounds clear to auscultation       Cardiovascular hypertension, Pt. on medications  Rhythm:Regular Rate:Normal     Neuro/Psych    GI/Hepatic   Endo/Other  diabetes, Type 2, Oral Hypoglycemic Agents  Renal/GU Renal disease     Musculoskeletal   Abdominal   Peds  Hematology   Anesthesia Other Findings   Reproductive/Obstetrics                             Anesthesia Physical Anesthesia Plan  ASA: III  Anesthesia Plan: General   Post-op Pain Management:    Induction: Intravenous  Airway Management Planned: Oral ETT  Additional Equipment:   Intra-op Plan:   Post-operative Plan: Extubation in OR  Informed Consent: I have reviewed the patients History and Physical, chart, labs and discussed the procedure including the risks, benefits and alternatives for the proposed anesthesia with the patient or authorized representative who has indicated his/her understanding and acceptance.     Plan Discussed with:   Anesthesia Plan Comments: (Pt c/o R jaw pain after last cysto (LMA), will try GOT this time.)        Anesthesia Quick Evaluation

## 2015-05-31 NOTE — Anesthesia Procedure Notes (Addendum)
Procedure Name: Intubation Date/Time: 05/31/2015 11:25 AM Performed by: Tressie Stalker E Pre-anesthesia Checklist: Patient identified, Patient being monitored, Timeout performed, Emergency Drugs available and Suction available Patient Re-evaluated:Patient Re-evaluated prior to inductionOxygen Delivery Method: Circle System Utilized Preoxygenation: Pre-oxygenation with 100% oxygen Intubation Type: IV induction Ventilation: Mask ventilation without difficulty Laryngoscope Size: Mac and 3 Grade View: Grade II Tube type: Oral Tube size: 7.0 mm Number of attempts: 1 Airway Equipment and Method: Stylet Placement Confirmation: ETT inserted through vocal cords under direct vision,  positive ETCO2 and breath sounds checked- equal and bilateral Secured at: 21 cm Tube secured with: Tape Dental Injury: Teeth and Oropharynx as per pre-operative assessment     Tube taped midline, Eso placed on left.

## 2015-06-02 ENCOUNTER — Encounter (HOSPITAL_COMMUNITY): Payer: Self-pay | Admitting: Urology

## 2015-06-06 LAB — STONE ANALYSIS
CA OXALATE, DIHYDRATE: 85 %
Ca Oxalate,Monohydr.: 10 %
Ca phos cry stone ql IR: 5 %
STONE WEIGHT KSTONE: 17 mg

## 2015-06-14 ENCOUNTER — Other Ambulatory Visit: Payer: Self-pay | Admitting: Urology

## 2015-06-14 ENCOUNTER — Ambulatory Visit (INDEPENDENT_AMBULATORY_CARE_PROVIDER_SITE_OTHER): Payer: Medicare Other | Admitting: Urology

## 2015-06-14 DIAGNOSIS — N200 Calculus of kidney: Secondary | ICD-10-CM | POA: Diagnosis not present

## 2015-07-20 ENCOUNTER — Encounter (HOSPITAL_COMMUNITY): Payer: Self-pay | Admitting: Urology

## 2015-08-15 ENCOUNTER — Encounter: Payer: Self-pay | Admitting: Neurology

## 2015-08-15 ENCOUNTER — Ambulatory Visit (INDEPENDENT_AMBULATORY_CARE_PROVIDER_SITE_OTHER): Payer: Medicare Other | Admitting: Neurology

## 2015-08-15 VITALS — BP 120/88 | HR 78 | Resp 20 | Ht 65.0 in | Wt 180.0 lb

## 2015-08-15 DIAGNOSIS — Z9989 Dependence on other enabling machines and devices: Principal | ICD-10-CM

## 2015-08-15 DIAGNOSIS — G4733 Obstructive sleep apnea (adult) (pediatric): Secondary | ICD-10-CM

## 2015-08-15 NOTE — Progress Notes (Signed)
[facility] SLEEP MEDICINE CONSULTATION   Provider:  Larey Seat, M D  Referring Provider: Leanna Battles, MD Primary Care Physician:  Donnajean Lopes, MD  Chief Complaint  Patient presents with  . Follow-up    cpap going well, having problems with interface, wants to switch to [facility] in  [place], [place], alone    HPI:  Julie Owen is a 70 y.o. female, who is seen here as a referral from Dr. Philip Aspen for an insomnia and snoring evaluation, ruling out apnea.    The patient is a retired Pharmacist, hospital, and still has some engrained sleep habits, such as rising at 5 AM as she did for over 40 years. She is not sleepy but fatigued, she exclaimed. She had related the changes in her sleep pattern to her menopause. She developed palpitations from caffeine in peri menopause, felt panicked and anxious. She used" to sleep like a baby", but at age 86 she found herself in completed  Menopause. She could finally relate hot flushes and mood changes, tearfulness. Her sleep never recovered.  She gained weight and begun snoring. Her sleep has become less and less deep, restorative.   The patient reports she usually goes to bed around 9 PM watches TV in the bedroom. About one hour later she will go to sleep, but wakes up 2-3 times to go to the bathroom. If she turns over she will wake up, too. She has sometimes trouble to go back to sleep.  She always wakes up at 2.30 and she finds no further restful sleep after that, rises at 5.30, spontaneously, no alarm needed.  She keeps a glass of water right beside her bed because she often has a dry mouth when waking up. She does not report any nocturnal headaches usually. The overall nocturnal sleep time is between 4-5 hours only, she takes  One hour nap in the afternoon, about 2-3 PM, and on her sofa. Her husband sleeps not in the same room, she snores and has woken herself up form snoring. Her dog sleeps nearby, but never wakes her. She has owned a dog  before that needed physically to be brought outside to urinate , 7-8 times at night. This dog died about 2 years ago.  She doesn't take any caffeine anymore. The sleep pattern may have changed than.   Family history - one cousin with OSA , non compliant  CPAP . Reviewed medication and alLergies,  HISTORY    06-10-14 Julie Owen is here today for her first visit after sleep study and CPAP titration. The patient underwent a polysomnography on 12-17-13 which revealed a mild apnea with an AHI of 10.5 and an RDI of 14.6 per hour but associated with oxygen desaturations. In the setting of snoring and periodic limb movements a CPAP titration was not performed the same night as the patient did not have enough sleep time at line. She returned on 02-18-14 for the CPAP titration her AHI was reduced to 1.4 periodic limb movements temple rarely increased and she did best at 8 cm water pressure under which she slept 492 minutes. She also experienced a REM rebound of 45 minutes the setting of 8 cm water pressure allowed for an AHI of 0.9 the patient could sleep on her right side and tolerated a nasal pillow very well. I am meeting today for compliance visit and Mrs. Lockmiller has told me that she started with CPAP treatment on December 8 and for the first month did exceedingly well  she had a very low threshold to adjust to the machine and its daily use she became highly compliant at 100% she has used the machine 97% of the time for over 4 hours at night. However in early January 2016 the machine suddenly stopped providing air in the midst night she passed for air woke up and found a displayed message that the toe was blocked or Holmes was blocked. There was no kinking of the tube and no obvious dislodgment of any parts and 2 similar messages or sudden failure to provide air has happened frequently since. She has spoken with the respiratory therapist at a [employer] her durable medical equipment company. She stated that the therapist  plaque the machine and stated that it works fine. She the patient is reasonably frustrated, first of all because her machine seems to not measures a correct time of use and can be off by as much as 2 hours underestimating her compliance time and in addition because she is now apprehensive of using the machine afraid that she will wake up air hungry. As I quoted her compliance date 4 1-20 7-16 shows an AHI of 0.488 cm water with 3 cm EPR and 6 hours and 59 minutes of daily use the patient is highly compliant and fulfills her insurance criteria but she has used the machine extra hours to get to these time measurements the machine itself seems to cut her short. I will inquire with apnea if the patient's machine can be exchanged for a new one if they think that the machine is likely either erroneously programmed or has a another electronic defect. Its air pressure  function is not in question.Her fatigue has increased, I will order an ONO while on CPAP.  Her machine is malfunctioning. She showed me the data of use, these vary by more than 2 hours on some days from the  Display. Her machine has switched off in the middle of the night.  Her fatigue has increased, I will order an ONO while on CPAP.   PLAN : [employer] to exchange the machine, ASAP. RV in 3 month with NP for follow up.  The patient still has some nasal congestion which may contribute to her difficulties breathing at night at least on occasion. I recommend a saltwater nasal spray to just literally flush the nostril. I have generated an electronic order to apnea to make sure that the patients machine will be exchanged for another one ASAP. We do not have to change the mask, Interface, Tubing or filter at this time.   Interval history from 08-15-14  JulieOwen has seen  benefit of a new CPAP machine. She had undergone an overnight pulse oximetry by using CPAP. But had downloaded data spoke for a very well controlled sleep apnea with an residual AHI file  below 5 her fatigue severity was very high and she was excessively daytime sleepy as well.  It turned out that she remained hypoxemic and that a new machine was needed to control her sleep apnea as well as her oxygen levels. Her durable medical equipment company, namely a [employer], exchanged her old not longer working machine for a new model which now is doing well. Her fatigue severity score today for further is 19 and her Epworth sleepiness score is 4 points. I was also able to get a new date download. The patient had been diagnosed with sleep apnea seems to do well on the current setting of 8 cm water. She continues to use an  Eason nasal mask in medium size. Heated humidification his knee is used. The AHI on 06-10-14 was 0.4 , at  a 97% compliance. The patient reports problems with [employer], being billed several hundred USD a month for a rental machine- and having not filed for insurance coverage ? . A broken machine needs to be replaced and is a insurance covered expense.    4-4-2017Assessment :  OSA , mild with hypoxemia , now on CPAP . Highly compliant , therapy started on 04-20-15,    Therapy did not reduce her fatigue or sleepiness . Repeated test as the patient had a non fucntioning machine.  New machine works well, Epworth and FSS now reduced.  Download of the current CPAP machine shows that the patient has 100% compliance for 30 out of 30 days of use and 30 days over 4 hours of daily use. the CPAP is still set at 8 cm with 2 EPR, the residual AHI is 0.7. There is no high air leak noted. I would like to review Mrs. Olsons pulse oximetry but serum since this was done on her new machine, it will probably not show any residual hypoxemia. She responded clinically so very well but I don't assume that hypoxemia is present.  Mrs. Skutt is now on her third CPAP machine and received her most recent in December 2016.  She is meanwhile 70 years of age she is using her CPAP with excellent compliance 100% of days  and 100% of those days over 4 hours, with an average hourly use of 7 hours and 46 minutes and the set pressure of 8 cm water this 2 cm expiratory pressure relief.  Her residual AHI is 0.7, which speaks for an excellent resolution. The 95th percentile airleak is close to 8 L/m which is a minimal air leak.    Review of Systems: Out of a complete 14 system review, the patient complains of only the following symptoms, and all other reviewed systems are negative. 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    The Epworth sleepiness score on 08/15/2015 was endorsed at 4 points, fatigue severity score at 17 points and  the geriatric depression scale at 7 points. A score between 5 and 10 is indicative or suggestive of depression.    Social History   Social History  . Marital Status: Married    Spouse Name: Gwyndolyn Saxon  . Number of Children: 0  . Years of Education: Master's   Occupational History  . Not on file.   Social History Main Topics  . Smoking status: Former Smoker    Types: Cigarettes    Start date: 12/11/1965    Quit date: 07/30/1975  . Smokeless tobacco: Never Used  . Alcohol Use: 1.8 oz/week    3 Glasses of wine per week     Comment: occas.  . Drug Use: No  . Sexual Activity: Not on file   Other Topics Concern  . Not on file   Social History Narrative   Patient is married Gwyndolyn Saxon) and lives at home with her husband.   Patient is a retired Pharmacist, hospital.   Patient has a Master's degree.   Patient is right-handed.   Patient does not drink any caffeine.    Family History  Problem Relation Age of Onset  . Hypertension Other   . Kidney disease Other   . Stroke Other   . Heart attack Other   . Cancer Other   .  Obesity Other   . Hyperlipidemia Mother   . Hypertension Mother   . Heart disease Mother   . Macular degeneration Father   . Cancer Sister     colon  . Hypertension Sister     Past Medical History  Diagnosis Date  .  Hyperlipidemia   . Prediabetes   . Hypertension   . Obesity   . Osteoarthritis of lumbar spine   . Osteoarthritis of thumb     bilateral  . Atrial septal defect   . Hip pain, right   . Diabetes mellitus without complication ([place])   . Renal disorder     kidney stones  . Sleep apnea     uses CPAP    Past Surgical History  Procedure Laterality Date  . Total hip arthroplasty  08/2010    Right hip  . Breast surgery  2008    Right breast  . Bunionectomy  1993    Right foot  . Breast reduction surgery    . Tonsillectomy    . Eyelid surgery Bilateral     Blephroplasty  . Cystoscopy/retrograde/ureteroscopy/stone extraction with basket Left 05/31/2015    Procedure: CYSTOSCOPY/RETROGRADE/STONE EXTRACTION WITH BASKET;  Surgeon: Cleon Gustin, MD;  Location: [facility];  Service: Urology;  Laterality: Left;  . Cystoscopy w/ ureteral stent placement Left 05/31/2015    Procedure: CYSTOSCOPY WITH STENT EXCHANGE;  Surgeon: Cleon Gustin, MD;  Location: [facility];  Service: Urology;  Laterality: Left;  . Cystoscopy with retrograde pyelogram, ureteroscopy and stent placement Left 05/19/2015    Procedure: [place], URETEROSCOPY AND STENT PLACEMENT;  Surgeon: Cleon Gustin, MD;  Location: [facility];  Service: Urology;  Laterality: Left;  . Stone extraction with basket Left 05/19/2015    Procedure: STONE EXTRACTION WITH BASKET;  Surgeon: Cleon Gustin, MD;  Location: [facility];  Service: Urology;  Laterality: Left;    Current Outpatient Prescriptions  Medication Sig Dispense Refill  . Alpha-Lipoic Acid 600 MG CAPS Take 600 mg by mouth at bedtime.    . Cholecalciferol (VITAMIN D3) 2000 UNITS TABS Take 1 tablet by mouth daily.    . fluticasone (FLONASE) 50 MCG/ACT nasal spray Place 2 sprays into both nostrils daily as needed for allergies or rhinitis.    Marland Kitchen losartan (COZAAR) 50 MG tablet Take 1 tablet by mouth daily.    . metFORMIN (GLUCOPHAGE-XR) 500 MG 24 hr tablet  Take 500 mg by mouth at bedtime.     . metroNIDAZOLE (METROGEL) 0.75 % gel Apply 1 application topically 2 (two) times daily.    . Misc. Devices MISC CPAP    . pravastatin (PRAVACHOL) 20 MG tablet Take 20 mg by mouth 3 (three) times a week. Monday, Wednesday, and Friday.    . Probiotic CAPS Take 1 capsule by mouth daily.     No current facility-administered medications for this visit.    Allergies as of 08/15/2015 - Review Complete 08/15/2015  Allergen Reaction Noted  . Aspirin Other (See Comments)   . Benzocaine  06/26/2011  . Caffeine  06/26/2011  . Codeine Swelling   . Lidocaine-epinephrine Other (See Comments) 11/04/2013  . Oxycodone-acetaminophen Swelling 11/04/2013  . Percocet [oxycodone-acetaminophen] Swelling 06/26/2011  . Procaine hcl  06/26/2011  . Pseudoephedrine hcl er Other (See Comments) 06/26/2011  . Simvastatin      Vitals: BP 120/88 mmHg  Pulse 78  Resp 20  Ht 5\' 5"  (1.651 m)  Wt 180 lb (81.647 kg)  BMI 29.95 kg/m2 Last Weight:  Wt Readings from Last 1 Encounters:  08/15/15 180 lb (81.647 kg)   Last Height:   Ht Readings from Last 1 Encounters:  08/15/15 5\' 5"  (1.651 m)    Physical exam:  General: The patient is awake, alert and appears not in acute distress. The patient is well groomed. Head: Normocephalic, atraumatic. Neck is supple. Mallampati 4, neck circumference:16 inches. Retrognathia.  Cardiovascular:  Regular rate and rhythm, without  murmurs or carotid bruit, and without distended neck veins. Respiratory: Lungs are clear to auscultation. Skin:  Without evidence of edema, or rash Trunk: BMI is elevated and patient  has normal posture.  Neurologic exam : The patient is awake and alert, oriented to place and time.   Memory subjective described as intact.  There is a normal attention span & concentration ability. Speech is fluent without  dysarthria, dysphonia or aphasia.  Mood and affect are appropriate.  Cranial nerves: Pupils are equal  and briskly reactive to light. Funduscopic exam without  evidence of pallor or edema.  Extraocular movements  in vertical and horizontal planes intact and without nystagmus. Visual fields by finger perimetry are intact. Hearing to finger rub intact.  Facial sensation intact to fine touch. Facial motor strength is symmetric and tongue and uvula move midline.  Assessment:  After physical and neurologic examination, review of laboratory studies, imaging, neurophysiology testing and pre-existing records, assessment is   1) CPAP with 100% compliance. Rv in 12 month, changed to [place] here in [place] from [place].     Karliah Kowalchuk, MD  Cc Leanna Battles, MD

## 2015-09-14 ENCOUNTER — Ambulatory Visit (HOSPITAL_COMMUNITY): Admission: RE | Admit: 2015-09-14 | Payer: Medicare Other | Source: Ambulatory Visit

## 2015-12-01 ENCOUNTER — Other Ambulatory Visit (HOSPITAL_COMMUNITY): Payer: Self-pay | Admitting: Internal Medicine

## 2015-12-01 DIAGNOSIS — Z1231 Encounter for screening mammogram for malignant neoplasm of breast: Secondary | ICD-10-CM

## 2015-12-07 ENCOUNTER — Ambulatory Visit (HOSPITAL_COMMUNITY)
Admission: RE | Admit: 2015-12-07 | Discharge: 2015-12-07 | Disposition: A | Discharge status: Home / Self Care | Payer: Medicare Other | Source: Ambulatory Visit | Attending: Internal Medicine | Admitting: Internal Medicine

## 2015-12-07 DIAGNOSIS — Z1231 Encounter for screening mammogram for malignant neoplasm of breast: Secondary | ICD-10-CM | POA: Insufficient documentation

## 2016-02-16 ENCOUNTER — Telehealth: Payer: Self-pay | Admitting: Internal Medicine

## 2016-03-05 NOTE — Telephone Encounter (Signed)
Dr.Pyrtle has reviewed records and accepted for patient to be scheduled for colonoscopy. Patient has been scheduled for appointment.

## 2016-05-01 ENCOUNTER — Ambulatory Visit (AMBULATORY_SURGERY_CENTER): Payer: Self-pay

## 2016-05-01 VITALS — Ht 65.0 in | Wt 157.0 lb

## 2016-05-01 DIAGNOSIS — Z800 Family history of malignant neoplasm of digestive organs: Secondary | ICD-10-CM

## 2016-05-01 MED ORDER — SUPREP BOWEL PREP KIT 17.5-3.13-1.6 GM/177ML PO SOLN: 1.0000 | Freq: Once | ORAL | 0 refills | Status: AC

## 2016-05-01 NOTE — Progress Notes (Signed)
No  Allergies to eggs or soy No home oxygen No diet meds No home oxygen but does use CPAP  Declined emmi

## 2016-05-09 ENCOUNTER — Ambulatory Visit (INDEPENDENT_AMBULATORY_CARE_PROVIDER_SITE_OTHER): Payer: Medicare Other | Admitting: Cardiovascular Disease

## 2016-05-09 ENCOUNTER — Encounter: Payer: Self-pay | Admitting: Cardiovascular Disease

## 2016-05-09 VITALS — BP 121/71 | HR 61 | Ht 65.0 in | Wt 157.0 lb

## 2016-05-09 DIAGNOSIS — Q211 Atrial septal defect, unspecified: Secondary | ICD-10-CM

## 2016-05-09 DIAGNOSIS — E7800 Pure hypercholesterolemia, unspecified: Secondary | ICD-10-CM | POA: Diagnosis not present

## 2016-05-09 DIAGNOSIS — I779 Disorder of arteries and arterioles, unspecified: Secondary | ICD-10-CM | POA: Diagnosis not present

## 2016-05-09 DIAGNOSIS — I10 Essential (primary) hypertension: Secondary | ICD-10-CM | POA: Diagnosis not present

## 2016-05-09 DIAGNOSIS — I739 Peripheral vascular disease, unspecified: Secondary | ICD-10-CM

## 2016-05-09 NOTE — Patient Instructions (Signed)
Your physician wants you to follow-up in: 1 YEAR WITH DR KONESWARAN You will receive a reminder letter in the mail two months in advance. If you don't receive a letter, please call our office to schedule the follow-up appointment.  Your physician recommends that you continue on your current medications as directed. Please refer to the Current Medication list given to you today.  Thank you for choosing [facility]!!    

## 2016-05-09 NOTE — Progress Notes (Signed)
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 There were plans for percutaneous closure by a Dr. Dwain Sarna in June/July 2006. However, this was not seen by her most recent echo with bubble study in 2013. She refused to undergo a cardiac MRI due to fears of dye allergy. She also has hypertension, hyperlipidemia, mild sleep apnea, and carotid artery disease.  The patient denies any symptoms of chest pain, palpitations, shortness of breath, leg swelling, orthopnea, PND, and syncope.  She has lost 42 lbs and is no longer on antihypertensive medications.  ECG performed in the office today which I personally reviewed demonstrates normal sinus rhythm with no ischemic ST segment or T-wave abnormalities, nor any arrhythmias.   Review of Systems: As per "subjective", otherwise negative.  Allergies  Allergen Reactions  . Aspirin Other (See Comments)    Abdominal pain  . Benzocaine   . Caffeine     Panic attacks  . Codeine Swelling  . Lidocaine-Epinephrine Other (See Comments)  . Oxycodone-Acetaminophen Swelling  . Percocet [Oxycodone-Acetaminophen] Swelling  . Procaine Hcl   . Pseudoephedrine Hcl Er Other (See Comments)    Jittery, nervousness  . Simvastatin     REACTION: hair loss    Current Outpatient Prescriptions  Medication Sig Dispense Refill  . Alpha-Lipoic Acid 600 MG CAPS Take 600 mg by mouth at bedtime.    . metFORMIN (GLUCOPHAGE-XR) 500 MG 24 hr tablet Take 500 mg by mouth at bedtime.     . metroNIDAZOLE (METROGEL) 0.75 % gel Apply 1 application topically 2 (two) times daily.    . Misc. Devices MISC CPAP    . TURMERIC PO Take 1 tablet by mouth daily.     No current facility-administered medications for this visit.     Past Medical History:  Diagnosis Date  . Atrial septal defect   . Diabetes mellitus without complication ([place])   . Hip pain, right   . Hyperlipidemia   . Hypertension   . Obesity     . Osteoarthritis of lumbar spine   . Osteoarthritis of thumb    bilateral  . Prediabetes   . Renal disorder    kidney stones  . Sleep apnea    uses CPAP    Past Surgical History:  Procedure Laterality Date  . BREAST REDUCTION SURGERY    . BREAST SURGERY  2008   Right breast  . BUNIONECTOMY  1993   Right foot  . CYSTOSCOPY W/ URETERAL STENT PLACEMENT Left 05/31/2015   Procedure: CYSTOSCOPY WITH STENT EXCHANGE;  Surgeon: Cleon Gustin, MD;  Location: [facility];  Service: Urology;  Laterality: Left;  . CYSTOSCOPY WITH RETROGRADE PYELOGRAM, URETEROSCOPY AND STENT PLACEMENT Left 05/19/2015   Procedure: CYSTOSCOPY WITH RETROGRADE PYELOGRAM, URETEROSCOPY AND STENT PLACEMENT;  Surgeon: Cleon Gustin, MD;  Location: [facility];  Service: Urology;  Laterality: Left;  . CYSTOSCOPY/RETROGRADE/URETEROSCOPY/STONE EXTRACTION WITH BASKET Left 05/31/2015   Procedure: CYSTOSCOPY/RETROGRADE/STONE EXTRACTION WITH BASKET;  Surgeon: Cleon Gustin, MD;  Location: [facility];  Service: Urology;  Laterality: Left;  . eyelid surgery Bilateral    Blephroplasty  . STONE EXTRACTION WITH BASKET Left 05/19/2015   Procedure: STONE EXTRACTION WITH BASKET;  Surgeon: Cleon Gustin, MD;  Location: [facility];  Service: Urology;  Laterality: Left;  . TONSILLECTOMY    . TOTAL HIP ARTHROPLASTY  08/2010   Right hip    Social History   Social History  .  Marital status: Married    Spouse name: Gwyndolyn Saxon  . Number of children: 0  . Years of education: Master's   Occupational History  . Not on file.   Social History Main Topics  . Smoking status: Former Smoker    Packs/day: 0.50    Years: 10.00    Types: Cigarettes    Start date: 12/11/1965    Quit date: 07/30/1975  . Smokeless tobacco: Never Used  . Alcohol use No  . Drug use: No  . Sexual activity: Not on file   Other Topics Concern  . Not on file   Social History Narrative   Patient is married Gwyndolyn Saxon) and lives at home with her husband.   Patient is  a retired Pharmacist, hospital.   Patient has a Master's degree.   Patient is right-handed.   Patient does not drink any caffeine.     Vitals:   05/09/16 1420  BP: 121/71  Pulse: 61  SpO2: 98%  Weight: 157 lb (71.2 kg)  Height: 5\' 5"  (1.651 m)    PHYSICAL EXAM General: NAD HEENT: Normal. Neck: No JVD, no thyromegaly. Lungs: Clear to auscultation bilaterally with normal respiratory effort. CV: Nondisplaced PMI.  Regular rate and rhythm, normal S1/S2, no [id number], soft 1/6 systolic murmur heard throughout precordium. No pretibial or periankle edema.  No carotid bruit. Abdomen: Soft, nontender, no distention.  Neurologic: Alert and oriented x 3.  Psych: Normal affect. Skin: Normal. Musculoskeletal: No gross deformities. Extremities: No clubbing or cyanosis.     ECG: Most recent ECG reviewed.      ASSESSMENT AND PLAN: 1. Atrial septal defect/low secundum ASD: She is asymptomatic.This was not seen by her most recent echo with bubble study in 2013, although the patient previously told me that the technician did note some bubbles crossing over, albeit not a large amount. This was not documented on the official report. This diagnosis has impacted her life since 1980, in particular her finances with respect to repeated procedures and her life insurance premiums.   In order to definitively answer this question, I previously ordered a cardiac MRI but the patient was unwilling to proceed. I informed her that I would make sure she is adequately premedicated with steroids and antihistamines prior to undergoing a cardiac MRI should she decide to proceed with it. I also previously discussed the option of right heart catheterization with a saturation run for confirmatory purposes. Will continue with observation.  2. Essential HTN: Controlled with weight loss. No longer on Cozaar.  3. Hyperlipidemia: She takes pravastatin three times per week.  4. Bilateral carotid artery disease: Documented as  mild. I do not appreciate any bruits. I would consider Dopplers in the future.  5. Sleep apnea: On CPAP.   Dispo: f/u 1 year.   Kate Sable, M.D., F.A.C.C.

## 2016-05-15 ENCOUNTER — Ambulatory Visit (AMBULATORY_SURGERY_CENTER): Payer: Medicare Other | Admitting: Internal Medicine

## 2016-05-15 ENCOUNTER — Encounter: Payer: Self-pay | Admitting: Internal Medicine

## 2016-05-15 VITALS — BP 137/69 | HR 62 | Temp 95.5°F | Resp 14 | Ht 65.0 in | Wt 157.0 lb

## 2016-05-15 DIAGNOSIS — D124 Benign neoplasm of descending colon: Secondary | ICD-10-CM | POA: Diagnosis not present

## 2016-05-15 DIAGNOSIS — D122 Benign neoplasm of ascending colon: Secondary | ICD-10-CM | POA: Diagnosis not present

## 2016-05-15 DIAGNOSIS — Z1211 Encounter for screening for malignant neoplasm of colon: Secondary | ICD-10-CM | POA: Diagnosis present

## 2016-05-15 DIAGNOSIS — Z800 Family history of malignant neoplasm of digestive organs: Secondary | ICD-10-CM | POA: Diagnosis not present

## 2016-05-15 DIAGNOSIS — D123 Benign neoplasm of transverse colon: Secondary | ICD-10-CM

## 2016-05-15 DIAGNOSIS — Z1212 Encounter for screening for malignant neoplasm of rectum: Secondary | ICD-10-CM | POA: Diagnosis not present

## 2016-05-15 LAB — GLUCOSE, CAPILLARY
Glucose-Capillary: 93 mg/dL (ref 65–99)
Glucose-Capillary: 90 mg/dL (ref 65–99)

## 2016-05-15 MED ORDER — SODIUM CHLORIDE 0.9 % IV SOLN: 500.0000 mL | INTRAVENOUS | Status: AC

## 2016-05-15 NOTE — Progress Notes (Signed)
Called to room to assist during endoscopic procedure.  Patient ID and intended procedure confirmed with present staff. Received instructions for my participation in the procedure from the performing physician.  

## 2016-05-15 NOTE — Progress Notes (Signed)
Report to PACU, RN, vss, BBS= Clear.  

## 2016-05-15 NOTE — Op Note (Signed)
[place] Patient Name: Julie Owen Procedure Date: 05/15/2016 9:36 AM MRN: [id number] Endoscopist: Jerene Bears , MD Age: 70 Referring MD:  Date of Birth: [birth year] Gender: Female Account #: [id number] Procedure:                Colonoscopy Indications:              Screening in patient at increased risk: Family                            history of 1st-degree relative with colorectal                            cancer, Last colonoscopy: July 2011 Medicines:                Monitored Anesthesia Care Procedure:                Pre-Anesthesia Assessment:                           - Prior to the procedure, a History and Physical                            was performed, and patient medications and                            allergies were reviewed. The patient's tolerance of                            previous anesthesia was also reviewed. The risks                            and benefits of the procedure and the sedation                            options and risks were discussed with the patient.                            All questions were answered, and informed consent                            was obtained. Prior Anticoagulants: The patient has                            taken no previous anticoagulant or antiplatelet                            agents. ASA Grade Assessment: III - A patient with                            severe systemic disease. After reviewing the risks                            and benefits, the patient was deemed in  satisfactory condition to undergo the procedure.                           After obtaining informed consent, the colonoscope                            was passed under direct vision. Throughout the                            procedure, the patient's blood pressure, pulse, and                            oxygen saturations were monitored continuously. The                            Model PCF-[id number]  scope was introduced                            through the anus and advanced to the the cecum,                            identified by appendiceal orifice and ileocecal                            valve. The colonoscopy was performed without                            difficulty. The patient tolerated the procedure                            well. The quality of the bowel preparation was                            good. The ileocecal valve, appendiceal orifice, and                            rectum were photographed. Scope In: 9:45:09 AM Scope Out: 10:03:29 AM Scope Withdrawal Time: 0 hours 12 minutes 14 seconds  Total Procedure Duration: 0 hours 18 minutes 20 seconds  Findings:                 The digital rectal exam was normal.                           A 6 mm polyp was found in the ascending colon. The                            polyp was sessile. The polyp was removed with a                            cold snare. Resection and retrieval were complete.                           A 4 mm polyp was found in the proximal  transverse                            colon. The polyp was sessile. The polyp was removed                            with a cold snare. Resection and retrieval were                            complete.                           A 6 mm polyp was found in the distal transverse                            colon. The polyp was sessile. The polyp was removed                            with a cold snare. Resection and retrieval were                            complete.                           A few small-mouthed diverticula were found in the                            descending colon and hepatic flexure.                           Internal hemorrhoids were found during                            retroflexion. The hemorrhoids were small. Complications:            No immediate complications. Estimated Blood Loss:     Estimated blood loss was minimal. Impression:               -  One 6 mm polyp in the ascending colon, removed                            with a cold snare. Resected and retrieved.                           - One 4 mm polyp in the proximal transverse colon,                            removed with a cold snare. Resected and retrieved.                           - One 6 mm polyp in the distal transverse colon,                            removed with a cold snare. Resected and retrieved.                           -  Mild diverticulosis in the descending colon and                            at the hepatic flexure.                           - Small internal hemorrhoids. Recommendation:           - Patient has a contact number available for                            emergencies. The signs and symptoms of potential                            delayed complications were discussed with the                            patient. Return to normal activities tomorrow.                            Written discharge instructions were provided to the                            patient.                           - Resume previous diet.                           - Continue present medications.                           - Await pathology results.                           - Repeat colonoscopy is recommended. The                            colonoscopy date will be determined after pathology                            results from today's exam become available for                            review. Jerene Bears, MD 05/15/2016 10:07:19 AM This report has been signed electronically.

## 2016-05-15 NOTE — Patient Instructions (Signed)
YOU HAD AN ENDOSCOPIC PROCEDURE TODAY AT THE [facility]:   Refer to the procedure report that was given to you for any specific questions about what was found during the examination.  If the procedure report does not answer your questions, please call your gastroenterologist to clarify.  If you requested that your care partner not be given the details of your procedure findings, then the procedure report has been included in a sealed envelope for you to review at your convenience later.  YOU SHOULD EXPECT: Some feelings of bloating in the abdomen. Passage of more gas than usual.  Walking can help get rid of the air that was put into your GI tract during the procedure and reduce the bloating. If you had a lower endoscopy (such as a colonoscopy or flexible sigmoidoscopy) you may notice spotting of blood in your stool or on the toilet paper. If you underwent a bowel prep for your procedure, you may not have a normal bowel movement for a few days.  Please Note:  You might notice some irritation and congestion in your nose or some drainage.  This is from the oxygen used during your procedure.  There is no need for concern and it should clear up in a day or so.  SYMPTOMS TO REPORT IMMEDIATELY:   Following lower endoscopy (colonoscopy or flexible sigmoidoscopy):  Excessive amounts of blood in the stool  Significant tenderness or worsening of abdominal pains  Swelling of the abdomen that is new, acute  Fever of 100F or higher   Following upper endoscopy (EGD)  Vomiting of blood or coffee ground material  New chest pain or pain under the shoulder blades  Painful or persistently difficult swallowing  New shortness of breath  Fever of 100F or higher  Black, tarry-looking stools  For urgent or emergent issues, a gastroenterologist can be reached at any hour by calling [phone].   DIET:  We do recommend a small meal at first, but then you may proceed to your regular diet.  Drink  plenty of fluids but you should avoid alcoholic beverages for 24 hours.  ACTIVITY:  You should plan to take it easy for the rest of today and you should NOT DRIVE or use heavy machinery until tomorrow (because of the sedation medicines used during the test).    FOLLOW UP: Our staff will call the number listed on your records the next business day following your procedure to check on you and address any questions or concerns that you may have regarding the information given to you following your procedure. If we do not reach you, we will leave a message.  However, if you are feeling well and you are not experiencing any problems, there is no need to return our call.  We will assume that you have returned to your regular daily activities without incident.  If any biopsies were taken you will be contacted by phone or by letter within the next 1-3 weeks.  Please call us at [phone] if you have not heard about the biopsies in 3 weeks.    SIGNATURES/CONFIDENTIALITY: You and/or your care partner have signed paperwork which will be entered into your electronic medical record.  These signatures attest to the fact that that the information above on your After Visit Summary has been reviewed and is understood.  Full responsibility of the confidentiality of this discharge information lies with you and/or your care-partner.  Polyp, diverticulosis, high fiber diet and hemorrhoid information given. 

## 2016-05-16 ENCOUNTER — Telehealth: Payer: Self-pay

## 2016-05-16 NOTE — Telephone Encounter (Signed)
  Follow up Call-  Call back number 05/15/2016  Post procedure Call Back phone  # [phone]  Permission to leave phone message Yes  Some recent data might be hidden     Patient questions:  Do you have a fever, pain , or abdominal swelling? No. Pain Score  0 *  Have you tolerated food without any problems? Yes.    Have you been able to return to your normal activities? Yes.    Do you have any questions about your discharge instructions: Diet   No. Medications  No. Follow up visit  No.  Do you have questions or concerns about your Care? No.  Actions: * If pain score is 4 or above: No action needed, pain <4.

## 2016-05-22 ENCOUNTER — Encounter: Payer: Self-pay | Admitting: Internal Medicine

## 2016-08-15 ENCOUNTER — Encounter (INDEPENDENT_AMBULATORY_CARE_PROVIDER_SITE_OTHER): Payer: Self-pay

## 2016-08-15 ENCOUNTER — Encounter: Payer: Self-pay | Admitting: Neurology

## 2016-08-15 ENCOUNTER — Ambulatory Visit (INDEPENDENT_AMBULATORY_CARE_PROVIDER_SITE_OTHER): Payer: Medicare Other | Admitting: Neurology

## 2016-08-15 VITALS — BP 132/68 | HR 72 | Resp 16 | Ht 65.0 in | Wt 160.0 lb

## 2016-08-15 DIAGNOSIS — G4733 Obstructive sleep apnea (adult) (pediatric): Secondary | ICD-10-CM

## 2016-08-15 DIAGNOSIS — M3500 Sicca syndrome, unspecified: Secondary | ICD-10-CM

## 2016-08-15 DIAGNOSIS — Z9989 Dependence on other enabling machines and devices: Secondary | ICD-10-CM | POA: Diagnosis not present

## 2016-08-15 NOTE — Progress Notes (Signed)
[facility] SLEEP MEDICINE CONSULTATION   Provider:  Larey Seat, M D  Referring Provider: Leanna Battles, MD Primary Care Physician:  Donnajean Lopes, MD  Chief Complaint  Patient presents with  . Follow-up    Rm 11. Patient states that in the last couple of months she has had trouble with breathing through her mouth while using CPAP. She has tried using a chin strap which did not work for her. She is trying a FFM and still having trouble with dry mouth and throat. This current mask also blows air in her eyes.     HPI:   4-5 -2018.  CHANCIE LAMPERT is a 71 y.o. female, who is seen here as a referral from Dr. Philip Aspen for an insomnia and snoring evaluation, ruling out apnea.   Mrs. Cofield describes today a very troubling development. But she had no difficulties getting adjusted to CPAP use and for the first year was a very compliant user she then developed a tendency to mouth breathe woke up with a parched dry sticky tongue, and now was placed in a full face mask with little improvement. Her sleep time has always been only 4-5 hours at night before CPAP was initiated but now her sleep time is even less. She endorsed the fatigue severity score today at 32 points, the Epworth sleepiness score at 8 and the geriatric depression score at a very high rate at 10 out of 15 Points. She uses a chin strip.  Her compliance has been excellent in spite of the troubles that 100% 5hours 19  minutes each night ( but was 6-7 hours before) , 8 cm water pressure is currently used was 27 m EPR the residual AHI is 1.3. Her humidifier was not adjusted by the durable medical equipment company, she is currently followed by [facility]. She had no medication adjustments, and we were not able to identify other factors that could have led to a dry mouth. She does have dry eyes now- to she reports that she has an air leak into the eyes which irritates the eyes. Systane gel at night,  drops in daytime. I am looking for sicca symptoms and for a more comfortable mask. I will again place her on a nasasl pillow. Currently on an [employer].     Consultation, CD The patient is a retired Pharmacist, hospital, and still has some engrained sleep habits, such as rising at 5 AM as she did for over 40 years. She is not sleepy but fatigued, she exclaimed. She had related the changes in her sleep pattern to her menopause. She developed palpitations from caffeine in peri menopause, felt panicked and anxious. She used" to sleep like a baby", but at age 5 she found herself in completed  Menopause. She could finally relate hot flushes and mood changes, tearfulness. Her sleep never recovered.  She gained weight and begun snoring. Her sleep has become less and less deep, restorative.  The patient reports she usually goes to bed around 9 PM watches TV in the bedroom. About one hour later she will go to sleep, but wakes up 2-3 times to go to the bathroom. If she turns over she will wake up, too. She has sometimes trouble to go back to sleep.  She always wakes up at 2.30 and she finds no further restful sleep after that, rises at 5.30, spontaneously, no alarm needed.  She keeps a glass of water right beside her bed because she often has a  dry mouth when waking up. She does not report any nocturnal headaches usually. The overall nocturnal sleep time is between 4-5 hours only, she takes  One hour nap in the afternoon, about 2-3 PM, and on her sofa. Her husband sleeps not in the same room, she snores and has woken herself up form snoring. Her dog sleeps nearby, but never wakes her. She has owned a dog before that needed physically to be brought outside to urinate , 7-8 times at night. This dog died about 2 years ago.  She doesn't take any caffeine anymore. The sleep pattern may have changed than.  Family history - one cousin with OSA , non compliant  CPAP . Reviewed medication and alLergies,  HISTORY    06-10-14 Mrs.  Oberle is here today for her first visit after sleep study and CPAP titration. The patient underwent a polysomnography on 12-17-13 which revealed a mild apnea with an AHI of 10.5 and an RDI of 14.6 per hour but associated with oxygen desaturations. In the setting of snoring and periodic limb movements a CPAP titration was not performed the same night as the patient did not have enough sleep time at line. She returned on 02-18-14 for the CPAP titration,  her AHI was reduced to 1.4,  periodic limb movements were  rare - and she did best at 8 cm water pressure under which she slept 492 minutes.    PLAN : [employer] to exchange the machine, ASAP. RV in 3 month with NP for follow up.  The patient still has some nasal congestion which may contribute to her difficulties breathing at night at least on occasion. I recommend a saltwater nasal spray to just literally flush the nostril. I have generated an electronic order to apnea to make sure that the patients machine will be exchanged for another one ASAP. We do not have to change the mask, Interface, Tubing or filter at this time.   Interval history from 08-15-14  Mrs.Goonan has seen  benefit of a new CPAP machine. She had undergone an overnight pulse oximetry by using CPAP. But had downloaded data spoke for a very well controlled sleep apnea with an residual AHI file below 5 her fatigue severity was very high and she was excessively daytime sleepy as well.  It turned out that she remained hypoxemic and that a new machine was needed to control her sleep apnea as well as her oxygen levels. Her durable medical equipment company, namely a [employer], exchanged her old not longer working machine for a new model which now is doing well. Her fatigue severity score today for further is 19 and her Epworth sleepiness score is 4 points. I was also able to get a new date download. The patient had been diagnosed with sleep apnea seems to do well on the current setting of 8 cm water. She  continues to use an Eason nasal mask in medium size. Heated humidification his knee is used. The AHI on 06-10-14 was 0.4 , at  a 97% compliance. The patient reports problems with [employer], being billed several hundred USD a month for a rental machine- and having not filed for insurance coverage ? . A broken machine needs to be replaced and is a insurance covered expense.   Mrs. Coppess is now on her third CPAP machine and received her most recent in December 2016.  She is meanwhile 71 years of age she is using her CPAP with excellent compliance 100% of days and 100% of those days  over 4 hours, with an average hourly use of 7 hours and 46 minutes and the set pressure of 8 cm water this 2 cm expiratory pressure relief.  Her residual AHI is 0.7, which speaks for an excellent resolution. The 95th percentile airleak is close to 8 L/m which is a minimal air leak.    Review of Systems: Out of a complete 14 system review, the patient complains of only the following symptoms, and all other reviewed systems are negative. She is the main caretaker of her 65 year old father who is a difficult person.    The Epworth sleepiness score on 08/15/2015 was endorsed at 4 points, fatigue severity score at 17 points and  the geriatric depression scale at 7 points. A score between 5 and 10 is indicative or suggestive of depression.    Social History   Social History  . Marital status: Married    Spouse name: Gwyndolyn Saxon  . Number of children: 0  . Years of education: Master's   Occupational History  . Not on file.   Social History Main Topics  . Smoking status: Former Smoker    Packs/day: 0.50    Years: 10.00    Types: Cigarettes    Start date: 12/11/1965    Quit date: 07/30/1975  . Smokeless tobacco: Never Used  . Alcohol use No  . Drug use: No  . Sexual activity: Not on file   Other Topics Concern  . Not on file   Social History Narrative   Patient is married Gwyndolyn Saxon) and lives at home with her husband.    Patient is a retired Pharmacist, hospital.   Patient has a Master's degree.   Patient is right-handed.   Patient does not drink any caffeine.    Family History  Problem Relation Age of Onset  . Hyperlipidemia Mother   . Hypertension Mother   . Heart disease Mother   . Macular degeneration Father   . Cancer Sister     colon  . Hypertension Sister   . Hypertension Other   . Kidney disease Other   . Stroke Other   . Heart attack Other   . Cancer Other   . Obesity Other     Past Medical History:  Diagnosis Date  . Allergy   . Anxiety   . Atrial septal defect   . Depression   . Diabetes mellitus without complication ([place])   . Hip pain, right   . Hyperlipidemia   . Hypertension   . Neuromuscular disorder (HCC)    neuropathy  . Obesity   . Osteoarthritis of lumbar spine   . Osteoarthritis of thumb    bilateral  . Prediabetes   . Renal disorder    kidney stones  . Sleep apnea    uses CPAP    Past Surgical History:  Procedure Laterality Date  . BREAST REDUCTION SURGERY    . BREAST SURGERY  2008   Right breast  . BUNIONECTOMY  1993   Right foot  . CATARACT EXTRACTION    . CYSTOSCOPY W/ URETERAL STENT PLACEMENT Left 05/31/2015   Procedure: CYSTOSCOPY WITH STENT EXCHANGE;  Surgeon: Cleon Gustin, MD;  Location: [facility];  Service: Urology;  Laterality: Left;  . CYSTOSCOPY WITH RETROGRADE PYELOGRAM, URETEROSCOPY AND STENT PLACEMENT Left 05/19/2015   Procedure: CYSTOSCOPY WITH RETROGRADE PYELOGRAM, URETEROSCOPY AND STENT PLACEMENT;  Surgeon: Cleon Gustin, MD;  Location: [facility];  Service: Urology;  Laterality: Left;  . CYSTOSCOPY/RETROGRADE/URETEROSCOPY/STONE EXTRACTION WITH BASKET Left 05/31/2015  Procedure: CYSTOSCOPY/RETROGRADE/STONE EXTRACTION WITH BASKET;  Surgeon: Cleon Gustin, MD;  Location: [facility];  Service: Urology;  Laterality: Left;  . eyelid surgery Bilateral    Blephroplasty  . STONE EXTRACTION WITH BASKET Left 05/19/2015   Procedure: STONE EXTRACTION WITH  BASKET;  Surgeon: Cleon Gustin, MD;  Location: [facility];  Service: Urology;  Laterality: Left;  . TONSILLECTOMY    . TOTAL HIP ARTHROPLASTY  08/2010   Right hip    Current Outpatient Prescriptions  Medication Sig Dispense Refill  . Alpha-Lipoic Acid 600 MG CAPS Take 600 mg by mouth at bedtime.    Marland Kitchen antiseptic oral rinse (BIOTENE) LIQD 15 mLs by Mouth Rinse route as needed for dry mouth.    . metFORMIN (GLUCOPHAGE-XR) 500 MG 24 hr tablet Take 500 mg by mouth at bedtime.     . metroNIDAZOLE (METROGEL) 0.75 % gel Apply 1 application topically 2 (two) times daily.    . Misc. Devices MISC CPAP    . Polyethyl Glycol-Propyl Glycol (SYSTANE OP) Apply to eye.    . TURMERIC PO Take 1 tablet by mouth daily.     Current Facility-Administered Medications  Medication Dose Route Frequency Provider Last Rate Last Dose  . 0.9 %  sodium chloride infusion  500 mL Intravenous Continuous Jerene Bears, MD        Allergies as of 08/15/2016 - Review Complete 08/15/2016  Allergen Reaction Noted  . Aspirin Other (See Comments)   . Benzocaine  06/26/2011  . Caffeine  06/26/2011  . Codeine Swelling   . Lidocaine-epinephrine Other (See Comments) 11/04/2013  . Oxycodone-acetaminophen Swelling 11/04/2013  . Percocet [oxycodone-acetaminophen] Swelling 06/26/2011  . Procaine hcl  06/26/2011  . Pseudoephedrine hcl er Other (See Comments) 06/26/2011  . Simvastatin      Vitals: BP 132/68   Pulse 72   Resp 16   Ht 5\' 5"  (1.651 m)   Wt 160 lb (72.6 kg)   BMI 26.63 kg/m  Last Weight:  Wt Readings from Last 1 Encounters:  08/15/16 160 lb (72.6 kg)   Last Height:   Ht Readings from Last 1 Encounters:  08/15/16 5\' 5"  (1.651 m)    Physical exam:  General: The patient is awake, alert and appears not in acute distress. The patient is well groomed. Head: Normocephalic, atraumatic. Neck is supple. Mallampati 4, neck circumference:16 inches. Retrognathia.  Cardiovascular:  Regular rate and rhythm, Trunk:  BMI is elevated and patient  has normal posture.  Neurologic exam : The patient is awake and alert, oriented to place and time.   Memory subjective described as intact.  Speech is fluent without  dysarthria, dysphonia or aphasia.  Mood and affect are appropriate.  Cranial nerves: Pupils are equal and briskly reactive to light.  Extraocular movements in vertical and horizontal planes intact and without nystagmus. Visual fields by finger perimetry are intact. Hearing to finger rub intact.  Facial sensation intact to fine touch. Facial motor strength is symmetric and tongue and uvula move midline.  Assessment:  After physical and neurologic examination, review of laboratory studies, imaging, neurophysiology testing and pre-existing records, assessment is   Mrs. Buechner is currently struggling with her compliance on CPAP but she used to be a highly compliant CPAP user and had an easy time sleeping with CPAP while on nasal pillows. She has no change to a full face mask because she became a mouth breather and her mouth very dry in the morning. In addition she has air leakage to the  eyes. She still suffers from hot flashes and wakes up several times each night. Her sleep has not been as restful as it was for the initial first year of CPAP use. The settings on her CPAP work fine a residual AHI is 1.3 I think we need to return back to and nasal pillow mask, turn up the humidifier but she does not want warm air but cool air. It will be easier for her to tolerate that with a nasal pillow, and she would not feel as if it aggravates her rosacea. She advised me that she has a atrial septal defect and primary care physician had advised her against continued hormone replacement therapy after 10 years of use. The hot flashes returned after the medication was discontinued. She lost weight yet has more trouble breathing. She is sweating profusely when exercising and in her sleep.  I made an arrangement with Robin hyler  to refit for nasal pillows and adjust humidification.  I would like Dr Philip Aspen to consider evaluating her for the diaphoresis apart for HRT, and for dry eyes and mouth ( sicca ).  I will follow in 2-3 month per NP Cecille Rubin , NP.     Larey Seat, MD  Cc Leanna Battles, MD

## 2016-11-12 ENCOUNTER — Ambulatory Visit: Payer: Self-pay | Admitting: Nurse Practitioner

## 2016-11-18 ENCOUNTER — Ambulatory Visit: Payer: Self-pay | Admitting: Nurse Practitioner

## 2016-12-04 ENCOUNTER — Encounter: Payer: Self-pay | Admitting: Physical Therapy

## 2016-12-04 ENCOUNTER — Ambulatory Visit: Payer: Medicare Other | Attending: Physician Assistant | Admitting: Physical Therapy

## 2016-12-04 DIAGNOSIS — M6281 Muscle weakness (generalized): Secondary | ICD-10-CM | POA: Insufficient documentation

## 2016-12-04 DIAGNOSIS — M25551 Pain in right hip: Secondary | ICD-10-CM | POA: Diagnosis not present

## 2016-12-04 NOTE — Therapy (Signed)
[facility] [place], [place], [address] Phone: [phone]   Fax:  [phone]  Physical Therapy Evaluation  Patient Details  Name: LENETTE RAU MRN: [id number] Date of Birth: 1946/04/19 Referring Provider: Gerrit Halls PA-C.  Encounter Date: 12/04/2016      PT End of Session - 12/04/16 1447    Visit Number 1   Number of Visits 12   Date for PT Re-Evaluation 01/01/17   PT Start Time 0105   PT Stop Time 0201   PT Time Calculation (min) 56 min   Activity Tolerance Patient tolerated treatment well   Behavior During Therapy [facility] for tasks assessed/performed      Past Medical History:  Diagnosis Date  . Allergy   . Anxiety   . Atrial septal defect   . Depression   . Diabetes mellitus without complication ([place])   . Hip pain, right   . Hyperlipidemia   . Hypertension   . Neuromuscular disorder (HCC)    neuropathy  . Obesity   . Osteoarthritis of lumbar spine   . Osteoarthritis of thumb    bilateral  . Prediabetes   . Renal disorder    kidney stones  . Sleep apnea    uses CPAP    Past Surgical History:  Procedure Laterality Date  . BREAST REDUCTION SURGERY    . BREAST SURGERY  2008   Right breast  . BUNIONECTOMY  1993   Right foot  . CATARACT EXTRACTION    . CYSTOSCOPY W/ URETERAL STENT PLACEMENT Left 05/31/2015   Procedure: CYSTOSCOPY WITH STENT EXCHANGE;  Surgeon: Cleon Gustin, MD;  Location: [facility];  Service: Urology;  Laterality: Left;  . CYSTOSCOPY WITH RETROGRADE PYELOGRAM, URETEROSCOPY AND STENT PLACEMENT Left 05/19/2015   Procedure: CYSTOSCOPY WITH RETROGRADE PYELOGRAM, URETEROSCOPY AND STENT PLACEMENT;  Surgeon: Cleon Gustin, MD;  Location[facility];  Service: Urology;  Laterality: Left;  . CYSTOSCOPY/RETROGRADE/URETEROSCOPY/STONE EXTRACTION WITH BASKET Left 05/31/2015   Procedure: CYSTOSCOPY/RETROGRADE/STONE EXTRACTION WITH BASKET;  Surgeon: Cleon Gustin, MD;  Location: [facility];   Service: Urology;  Laterality: Left;  . eyelid surgery Bilateral    Blephroplasty  . STONE EXTRACTION WITH BASKET Left 05/19/2015   Procedure: STONE EXTRACTION WITH BASKET;  Surgeon: Cleon Gustin, MD;  Location: [facility];  Service: Urology;  Laterality: Left;  . TONSILLECTOMY    . TOTAL HIP ARTHROPLASTY  08/2010   Right hip    There were no vitals filed for this visit.       Subjective Assessment - 12/04/16 1452    Subjective The patient presents with c/o right hip pain that began about 5 months ago.  The pain has prevenetd her exercising as she would like to and she is unable to sleep on her right side.  Rest decreases her pain.   Pertinent History Right total hip replacement (08/24/10).   Limitations Walking   How long can you walk comfortably? Short community distances.   Patient Stated Goals Get back to exercising without right hip pain.   Currently in Pain? Yes   Pain Score 6    Pain Location Hip   Pain Orientation Right   Pain Descriptors / Indicators Aching   Pain Onset More than a month ago   Pain Frequency Constant   Aggravating Factors  See above.   Pain Relieving Factors See above.   Multiple Pain Sites No            [facility] PT Assessment - 12/04/16  0001      Assessment   Medical Diagnosis trochanteric bursitis of right hip.   Referring Provider Gerrit Halls PA-C.   Onset Date/Surgical Date --  5 months.     Precautions   Precautions --  No ultrasound.     Restrictions   Weight Bearing Restrictions No     Balance Screen   Has the patient fallen in the past 6 months No   Has the patient had a decrease in activity level because of a fear of falling?  No   Is the patient reluctant to leave their home because of a fear of falling?  No     Home Ec[employer] residence     Prior Function   Level of Independence Independent     Posture/Postural Control   Posture Comments WNL.     ROM / Strength   AROM / PROM / Strength  AROM;Strength     AROM   Overall AROM Comments Functional right LE ROM mindful of prior right THA.     Strength   Overall Strength Comments ight hip abduction= 4 to 4+/5.     Palpation   Palpation comment Tender to palpation directly over patient's right greater trochanter and just posterior to this region.     Special Tests    Special Tests --  = leg lengths.     Ambulation/Gait   Gait Comments WNL.            Objective measurements completed on examination: See above findings.          [facility] Adult PT Treatment/Exercise - 12/04/16 0001      Modalities   Modalities Electrical Stimulation;Moist Heat     Electrical Stimulation   Electrical Stimulation Location Right lateral hip.   Electrical Stimulation Action IFC   Electrical Stimulation Parameters 80-150 Hz on 100% scan x 20 minutes.   Electrical Stimulation Goals Pain                PT Education - 12/04/16 1456    Education provided Yes   Education Details Instructed patient in ice massage.   Person(s) Educated Patient   Methods Explanation   Comprehension Verbalized understanding             PT Long Term Goals - 12/04/16 1506      PT LONG TERM GOAL #1   Title Independent with a HEP.   Time 4   Period Weeks   Status New     PT LONG TERM GOAL #2   Title Increase right hip abduction to 5/5.   Time 4   Period Weeks   Status New     PT LONG TERM GOAL #3   Title Sleep undisturbed 6 hours.   Time 4   Period Weeks   Status New     PT LONG TERM GOAL #4   Title Return to normal exercise regimen.   Time 4   Period Weeks   Status New                Plan - 12/04/16 1500    Clinical Impression Statement The patient presents with worsening right hip pain over the last 5 months.  She is palpably tender to palpation over her right greater trochanter and just posterior to this region.  She has minimal loss of right hip abduction strength.  Her pain has prohibited her from  exercsing as she once was and she cannot sleep on  her right side due to pain.  Patient will benefit from skilled physical therapy to address deficits.   History and Personal Factors relevant to plan of care: Lumbar OA.   Clinical Presentation Evolving   Clinical Presentation due to: Worsening symptoms.   Clinical Decision Making Low   Rehab Potential Excellent   PT Frequency 3x / week   PT Duration 4 weeks   PT Treatment/Interventions ADLs/Self Care Home Management;Cryotherapy;Electrical Stimulation;Iontophoresis 4mg /ml Dexamethasone;Moist Heat;Therapeutic activities;Therapeutic exercise;Patient/family education;Neuromuscular re-education;Manual techniques;Dry needling   PT Next Visit Plan E'stim to right hip; STW/M.  Right hip abduction strengthening.  Iontophoreis.Marland Kitchenplease look under "other orders" tab to signed if cert is signed.   Consulted and Agree with Plan of Care Patient      Patient will benefit from skilled therapeutic intervention in order to improve the following deficits and impairments:  Decreased activity tolerance, Decreased strength, Pain  Visit Diagnosis: Pain in right hip - Plan: PT plan of care cert/re-cert  Muscle weakness (generalized) - Plan: PT plan of care cert/re-cert      G-Codes - 67/34/19 1446    Functional Assessment Tool Used (Outpatient Only) FOTO...60% limitation.   Functional Limitation Mobility: Walking and moving around   Mobility: Walking and Moving Around Current Status [phone]) At least 60 percent but less than 80 percent impaired, limited or restricted   Mobility: Walking and Moving Around Goal Status [phone]) At least 1 percent but less than 20 percent impaired, limited or restricted       Problem List Patient Active Problem List   Diagnosis Date Noted  . Sicca ([place]) 08/15/2016  . OSA on CPAP 06/10/2014  . Compliance with medication regimen 06/10/2014  . Snorings 11/04/2013  . Nocturia 11/04/2013  . Insomnia 11/04/2013  . Diabetes  mellitus ([place]) 01/10/2012  . Essential hypertension, benign 04/23/2010  . ATRIAL SEPTAL DEFECT, HX OF 04/23/2010    Elayah Klooster, [place] MPT 12/04/2016, 3:10 PM  [facility] [address] [place], [place], [address] Phone: [phone]   Fax:  [phone]  Name: CRISTELA STALDER MRN: [id number] Date of Birth: November 22, 1945

## 2016-12-05 ENCOUNTER — Ambulatory Visit: Payer: Medicare Other | Admitting: Physical Therapy

## 2016-12-05 DIAGNOSIS — M25551 Pain in right hip: Secondary | ICD-10-CM | POA: Diagnosis not present

## 2016-12-05 DIAGNOSIS — M6281 Muscle weakness (generalized): Secondary | ICD-10-CM

## 2016-12-05 NOTE — Therapy (Signed)
[facility] [place], [place], [address] Phone: [phone]   Fax:  [phone]  Physical Therapy Treatment  Patient Details  Name: Julie Owen MRN: [id number] Date of Birth: 01-Jan-1946 Referring Provider: Gerrit Halls PA-C.  Encounter Date: 12/05/2016      PT End of Session - 12/05/16 1342    Visit Number 2   Number of Visits 12   Date for PT Re-Evaluation 01/01/17   PT Start Time 1200   PT Stop Time 1259   PT Time Calculation (min) 59 min   Activity Tolerance Patient tolerated treatment well   Behavior During Therapy [facility] for tasks assessed/performed      Past Medical History:  Diagnosis Date  . Allergy   . Anxiety   . Atrial septal defect   . Depression   . Diabetes mellitus without complication ([place])   . Hip pain, right   . Hyperlipidemia   . Hypertension   . Neuromuscular disorder (HCC)    neuropathy  . Obesity   . Osteoarthritis of lumbar spine   . Osteoarthritis of thumb    bilateral  . Prediabetes   . Renal disorder    kidney stones  . Sleep apnea    uses CPAP    Past Surgical History:  Procedure Laterality Date  . BREAST REDUCTION SURGERY    . BREAST SURGERY  2008   Right breast  . BUNIONECTOMY  1993   Right foot  . CATARACT EXTRACTION    . CYSTOSCOPY W/ URETERAL STENT PLACEMENT Left 05/31/2015   Procedure: CYSTOSCOPY WITH STENT EXCHANGE;  Surgeon: Cleon Gustin, MD;  [facility];  Service: Urology;  Laterality: Left;  . CYSTOSCOPY WITH RETROGRADE PYELOGRAM, URETEROSCOPY AND STENT PLACEMENT Left 05/19/2015   Procedure: CYSTOSCOPY WITH RETROGRADE PYELOGRAM, URETEROSCOPY AND STENT PLACEMENT;  Surgeon: Cleon Gustin, MD;  [facility];  Service: Urology;  Laterality: Left;  . CYSTOSCOPY/RETROGRADE/URETEROSCOPY/STONE EXTRACTION WITH BASKET Left 05/31/2015   Procedure: CYSTOSCOPY/RETROGRADE/STONE EXTRACTION WITH BASKET;  Surgeon: Cleon Gustin, MD;  Location[facility];   Service: Urology;  Laterality: Left;  . eyelid surgery Bilateral    Blephroplasty  . STONE EXTRACTION WITH BASKET Left 05/19/2015   Procedure: STONE EXTRACTION WITH BASKET;  Surgeon: Cleon Gustin, MD;  Location: [facility];  Service: Urology;  Laterality: Left;  . TONSILLECTOMY    . TOTAL HIP ARTHROPLASTY  08/2010   Right hip    There were no vitals filed for this visit.      Subjective Assessment - 12/05/16 1327    Subjective I was able to easily move my leg into my vehicle after that treatment.   Pertinent History Right total hip replacement (08/24/10).   Pain Score 4    Pain Location Hip   Pain Orientation Right   Pain Descriptors / Indicators Aching   Pain Onset More than a month ago                         [facility] Adult PT Treatment/Exercise - 12/05/16 0001      Modalities   Modalities Iontophoresis     Electrical Stimulation   Electrical Stimulation Location Right lateral hip.   Electrical Stimulation Action IFC   Electrical Stimulation Parameters 80-150 Hz x 20 minutes.   Electrical Stimulation Goals Pain     Iontophoresis   Type of Iontophoresis Dexamethasone   Location Right greater trochanter   Dose 80 mA-Min.   Time --  8 minutes.     Manual Therapy   Manual Therapy Scapular mobilization   Manual therapy comments Left sdly position with folded between knees for comfort:  STW/M including IASTM to affected right hip region x 23 minutes.                PT Education - 12/13/2016 1456    Education provided Yes   Education Details Instructed patient in ice massage.   Person(s) Educated Patient   Methods Explanation   Comprehension Verbalized understanding             PT Long Term Goals - 2016/12/13 1506      PT LONG TERM GOAL #1   Title Independent with a HEP.   Time 4   Period Weeks   Status New     PT LONG TERM GOAL #2   Title Increase right hip abduction to 5/5.   Time 4   Period Weeks   Status New     PT LONG TERM  GOAL #3   Title Sleep undisturbed 6 hours.   Time 4   Period Weeks   Status New     PT LONG TERM GOAL #4   Title Return to normal exercise regimen.   Time 4   Period Weeks   Status New               Plan - 12/05/16 1344    Clinical Impression Statement Excellent response to treatments thus far with a reduction in pain reported.   PT Treatment/Interventions ADLs/Self Care Home Management;Cryotherapy;Electrical Stimulation;Iontophoresis 4mg /ml Dexamethasone;Moist Heat;Therapeutic activities;Therapeutic exercise;Patient/family education;Neuromuscular re-education;Manual techniques;Dry needling      Patient will benefit from skilled therapeutic intervention in order to improve the following deficits and impairments:  Decreased activity tolerance, Decreased strength, Pain  Visit Diagnosis: Pain in right hip  Muscle weakness (generalized)       G-Codes - Dec 13, 2016 1446    Functional Assessment Tool Used (Outpatient Only) FOTO...60% limitation.   Functional Limitation Mobility: Walking and moving around   Mobility: Walking and Moving Around Current Status [phone]) At least 60 percent but less than 80 percent impaired, limited or restricted   Mobility: Walking and Moving Around Goal Status [phone]) At least 1 percent but less than 20 percent impaired, limited or restricted      Problem List Patient Active Problem List   Diagnosis Date Noted  . Sicca ([place]) 08/15/2016  . OSA on CPAP 06/10/2014  . Compliance with medication regimen 06/10/2014  . Snorings 11/04/2013  . Nocturia 11/04/2013  . Insomnia 11/04/2013  . Diabetes mellitus ([place]) 01/10/2012  . Essential hypertension, benign 04/23/2010  . ATRIAL SEPTAL DEFECT, HX OF 04/23/2010    Julie Owen, [place] MPT 12/05/2016, 1:45 PM  [facility] [address] [place], [place], [address] Phone: [phone]   Fax:  [phone]  Name: Julie Owen MRN: [id number] Date of Birth:  1945/09/19

## 2016-12-09 ENCOUNTER — Ambulatory Visit: Payer: Medicare Other | Admitting: Physical Therapy

## 2016-12-09 DIAGNOSIS — M25551 Pain in right hip: Secondary | ICD-10-CM | POA: Diagnosis not present

## 2016-12-09 NOTE — Patient Instructions (Addendum)
Trigger Point Dry Needling  . What is Trigger Point Dry Needling (DN)? o DN is a physical therapy technique used to treat muscle pain and dysfunction. Specifically, DN helps deactivate muscle trigger points (muscle knots).  o A thin filiform needle is used to penetrate the skin and stimulate the underlying trigger point. The goal is for a local twitch response (LTR) to occur and for the trigger point to relax. No medication of any kind is injected during the procedure.   . What Does Trigger Point Dry Needling Feel Like?  o The procedure feels different for each individual patient. Some patients report that they do not actually feel the needle enter the skin and overall the process is not painful. Very mild bleeding may occur. However, many patients feel a deep cramping in the muscle in which the needle was inserted. This is the local twitch response.   Marland Kitchen How Will I feel after the treatment? o Soreness is normal, and the onset of soreness may not occur for a few hours. Typically this soreness does not last longer than two days.  o Bruising is uncommon, however; ice can be used to decrease any possible bruising.  o In rare cases feeling tired or nauseous after the treatment is normal. In addition, your symptoms may get worse before they get better, this period will typically not last longer than 24 hours.   . What Can I do After My Treatment? o Increase your hydration by drinking more water for the next 24 hours. o You may place ice or heat on the areas treated that have become sore, however, do not use heat on inflamed or bruised areas. Heat often brings more relief post needling. o You can continue your regular activities, but vigorous activity is not recommended initially after the treatment for 24 hours. o DN is best combined with other physical therapy such as strengthening, stretching, and other therapies.    Precautions:  In some cases, dry needling is done over the lung field. While rare,  there is a risk of pneumothorax (punctured lung). Because of this, if you ever experience shortness of breath on exertion, difficulty taking a deep breath, chest pain or a dry cough following dry needling, you should report to an emergency room and tell them that you have been dry needled over the thorax.   Piriformis (Supine)  Hold stretches 60 seconds or longer. 2-3 reps. 2x/day  Cross legs, right on top. Gently pull other knee toward chest until stretch is felt in buttock/hip of top leg. Hold ____ seconds. Repeat ____ times per set. Do ____ sets per session. Do ____ sessions per day.  Hip Stretch  Put right ankle over left knee. Let right knee fall downward, but keep ankle in place. Feel the stretch in hip. May push down gently with hand to feel stretch. Hold ____ seconds while counting out loud. Repeat with other leg. Repeat ____ times. Do ____ sessions per day.   Iliotibial Band Stretch, Side-Lying   Lie on side, back to edge of bed, top arm in front. Allow top leg to drape behind over edge. Hold 1-5 min.  . Do _2-3 sessions per day.  Madelyn Flavors, PT 12/09/16 1:40 PM [facility] [place], [place], [address] Phone: [phone]   Fax:  [phone]

## 2016-12-09 NOTE — Therapy (Signed)
[facility] [place], [place], [address] Phone: [phone]   Fax:  [phone]  Physical Therapy Treatment  Patient Details  Name: Julie Owen MRN: [id number] Date of Birth: 1945/12/17 Referring Provider: Gerrit Halls PA-C.  Encounter Date: 12/09/2016      PT End of Session - 12/09/16 1345    Visit Number 3   Number of Visits 12   Date for PT Re-Evaluation 01/01/17   PT Start Time 1300   PT Stop Time 1400   PT Time Calculation (min) 60 min   Activity Tolerance Patient tolerated treatment well   Behavior During Therapy [facility] for tasks assessed/performed      Past Medical History:  Diagnosis Date  . Allergy   . Anxiety   . Atrial septal defect   . Depression   . Diabetes mellitus without complication ([place])   . Hip pain, right   . Hyperlipidemia   . Hypertension   . Neuromuscular disorder (HCC)    neuropathy  . Obesity   . Osteoarthritis of lumbar spine   . Osteoarthritis of thumb    bilateral  . Prediabetes   . Renal disorder    kidney stones  . Sleep apnea    uses CPAP    Past Surgical History:  Procedure Laterality Date  . BREAST REDUCTION SURGERY    . BREAST SURGERY  2008   Right breast  . BUNIONECTOMY  1993   Right foot  . CATARACT EXTRACTION    . CYSTOSCOPY W/ URETERAL STENT PLACEMENT Left 05/31/2015   Procedure: CYSTOSCOPY WITH STENT EXCHANGE;  Surgeon: Cleon Gustin, MD;  Location[facility];  Service: Urology;  Laterality: Left;  . CYSTOSCOPY WITH RETROGRADE PYELOGRAM, URETEROSCOPY AND STENT PLACEMENT Left 05/19/2015   Procedure: CYSTOSCOPY WITH RETROGRADE PYELOGRAM, URETEROSCOPY AND STENT PLACEMENT;  Surgeon: Cleon Gustin, MD;  Location: [facility];  Service: Urology;  Laterality: Left;  . CYSTOSCOPY/RETROGRADE/URETEROSCOPY/STONE EXTRACTION WITH BASKET Left 05/31/2015   Procedure: CYSTOSCOPY/RETROGRADE/STONE EXTRACTION WITH BASKET;  Surgeon: Cleon Gustin, MD;  Location: [facility];   Service: Urology;  Laterality: Left;  . eyelid surgery Bilateral    Blephroplasty  . STONE EXTRACTION WITH BASKET Left 05/19/2015   Procedure: STONE EXTRACTION WITH BASKET;  Surgeon: Cleon Gustin, MD;  Location: [facility];  Service: Urology;  Laterality: Left;  . TONSILLECTOMY    . TOTAL HIP ARTHROPLASTY  08/2010   Right hip    There were no vitals filed for this visit.      Subjective Assessment - 12/09/16 1303    Subjective Patient reports she is about the same. She has pain mainly with movement.   Pertinent History Right total hip replacement (08/24/10).   Limitations Walking   How long can you walk comfortably? Short community distances.   Patient Stated Goals Get back to exercising without right hip pain.   Currently in Pain? Yes   Pain Score 0-No pain  right now but up to 6/10 with activity   Pain Location Hip   Pain Orientation Right   Pain Descriptors / Indicators Aching                         [facility] Adult PT Treatment/Exercise - 12/09/16 0001      Exercises   Exercises Knee/Hip;Lumbar     Lumbar Exercises: Stretches   Passive Hamstring Stretch 1 rep;30 seconds   Passive Hamstring Stretch Limitations reassessed after DN no pain   ITB  Stretch 1 rep;60 seconds  R   Piriformis Stretch 30 seconds;2 reps  figure 4; various other PF stretches tried to find best stre   Piriformis Stretch Limitations R side     Lumbar Exercises: Supine   Ab Set 5 reps   AB Set Limitations TA: used TC and VC to engage correct muscles     Modalities   Modalities Electrical Stimulation;Iontophoresis     Acupuncturist Location R gluteals and PF   Electrical Stimulation Action IFC   Electrical Stimulation Parameters 80-150 Hz x 15 min   Electrical Stimulation Goals Pain     Iontophoresis   Type of Iontophoresis Dexamethasone   Location R greater trochanter   Dose 64mAmin   Time 4 hour patch          Trigger Point Dry  Needling - 12/09/16 1559    Consent Given? Yes   Education Handout Provided Yes   Muscles Treated Lower Body Gluteus minimus;Gluteus maximus;Piriformis   Gluteus Maximus Response Twitch response elicited;Palpable increased muscle length  R   Gluteus Minimus Response Twitch response elicited;Palpable increased muscle length  R and medius also   Piriformis Response Twitch response elicited;Palpable increased muscle length  R              PT Education - 12/09/16 1547    Education provided Yes   Education Details HEP; dry needling education and aftercare   Person(s) Educated Patient   Methods Explanation;Demonstration;Handout;Tactile cues;Verbal cues   Comprehension Verbalized understanding;Returned demonstration             PT Long Term Goals - 12/04/16 1506      PT LONG TERM GOAL #1   Title Independent with a HEP.   Time 4   Period Weeks   Status New     PT LONG TERM GOAL #2   Title Increase right hip abduction to 5/5.   Time 4   Period Weeks   Status New     PT LONG TERM GOAL #3   Title Sleep undisturbed 6 hours.   Time 4   Period Weeks   Status New     PT LONG TERM GOAL #4   Title Return to normal exercise regimen.   Time 4   Period Weeks   Status New               Plan - 12/09/16 1548    Clinical Impression Statement Patient did very well with treatment today. She demonstrated increased tissue length and decreased pain with piriformis stretching and HS stretching  following DN.    PT Treatment/Interventions ADLs/Self Care Home Management;Cryotherapy;Electrical Stimulation;Iontophoresis 4mg /ml Dexamethasone;Moist Heat;Therapeutic activities;Therapeutic exercise;Patient/family education;Neuromuscular re-education;Manual techniques;Dry needling   PT Next Visit Plan Assess DN; continue [place]. E'stim to right hip prn; STW/M.  Right hip abduction strengthening.  Iontophoreis.Marland Kitchenplease look under "other orders" tab to signed if cert is signed.   PT Home  Exercise Plan figure 4, SDLY ITB stretch      Patient will benefit from skilled therapeutic intervention in order to improve the following deficits and impairments:  Decreased activity tolerance, Decreased strength, Pain  Visit Diagnosis: Pain in right hip     Problem List Patient Active Problem List   Diagnosis Date Noted  . Sicca ([place]) 08/15/2016  . OSA on CPAP 06/10/2014  . Compliance with medication regimen 06/10/2014  . Snorings 11/04/2013  . Nocturia 11/04/2013  . Insomnia 11/04/2013  . Diabetes mellitus ([place]) 01/10/2012  . Essential  hypertension, benign 04/23/2010  . ATRIAL SEPTAL DEFECT, HX OF 04/23/2010   Madelyn Flavors PT 12/09/2016, 4:00 PM  [facility] [address] [place], [place], [address] Phone: [phone]   Fax:  [phone]  Name: Julie Owen MRN: [id number] Date of Birth: 12/26/1945

## 2016-12-12 ENCOUNTER — Encounter: Payer: Medicare Other | Admitting: *Deleted

## 2016-12-13 ENCOUNTER — Ambulatory Visit: Payer: Medicare Other | Attending: Physician Assistant | Admitting: Physical Therapy

## 2016-12-13 DIAGNOSIS — M25551 Pain in right hip: Secondary | ICD-10-CM | POA: Diagnosis not present

## 2016-12-13 DIAGNOSIS — M6281 Muscle weakness (generalized): Secondary | ICD-10-CM | POA: Diagnosis present

## 2016-12-13 NOTE — Therapy (Signed)
[facility] [place], [place], [address] Phone: [phone]   Fax:  [phone]  Physical Therapy Treatment  Patient Details  Name: Julie Owen MRN: [id number] Date of Birth: 1946-04-03 Referring Provider: Gerrit Halls PA-C.  Encounter Date: 12/13/2016      PT End of Session - 12/13/16 1033    Visit Number 4   Number of Visits 12   Date for PT Re-Evaluation 01/01/17   PT Start Time 1033   PT Stop Time 1128   PT Time Calculation (min) 55 min   Activity Tolerance Patient tolerated treatment well   Behavior During Therapy [facility] for tasks assessed/performed      Past Medical History:  Diagnosis Date  . Allergy   . Anxiety   . Atrial septal defect   . Depression   . Diabetes mellitus without complication ([place])   . Hip pain, right   . Hyperlipidemia   . Hypertension   . Neuromuscular disorder (HCC)    neuropathy  . Obesity   . Osteoarthritis of lumbar spine   . Osteoarthritis of thumb    bilateral  . Prediabetes   . Renal disorder    kidney stones  . Sleep apnea    uses CPAP    Past Surgical History:  Procedure Laterality Date  . BREAST REDUCTION SURGERY    . BREAST SURGERY  2008   Right breast  . BUNIONECTOMY  1993   Right foot  . CATARACT EXTRACTION    . CYSTOSCOPY W/ URETERAL STENT PLACEMENT Left 05/31/2015   Procedure: CYSTOSCOPY WITH STENT EXCHANGE;  Surgeon: Cleon Gustin, MD;  Location: [facility];  Service: Urology;  Laterality: Left;  . CYSTOSCOPY WITH RETROGRADE PYELOGRAM, URETEROSCOPY AND STENT PLACEMENT Left 05/19/2015   Procedure: CYSTOSCOPY WITH RETROGRADE PYELOGRAM, URETEROSCOPY AND STENT PLACEMENT;  Surgeon: Cleon Gustin, MD;  Location: [facility];  Service: Urology;  Laterality: Left;  . CYSTOSCOPY/RETROGRADE/URETEROSCOPY/STONE EXTRACTION WITH BASKET Left 05/31/2015   Procedure: CYSTOSCOPY/RETROGRADE/STONE EXTRACTION WITH BASKET;  Surgeon: Cleon Gustin, MD;  Location: [facility];   Service: Urology;  Laterality: Left;  . eyelid surgery Bilateral    Blephroplasty  . STONE EXTRACTION WITH BASKET Left 05/19/2015   Procedure: STONE EXTRACTION WITH BASKET;  Surgeon: Cleon Gustin, MD;  Location: [facility];  Service: Urology;  Laterality: Left;  . TONSILLECTOMY    . TOTAL HIP ARTHROPLASTY  08/2010   Right hip    There were no vitals filed for this visit.      Subjective Assessment - 12/13/16 1037    Subjective Patient did well with DN with just some soreness the next day.   Pertinent History Right total hip replacement (08/24/10).   Limitations Walking   How long can you walk comfortably? Short community distances.   Patient Stated Goals Get back to exercising without right hip pain.   Currently in Pain? No/denies                         [facility] Adult PT Treatment/Exercise - 12/13/16 0001      Transfers   Transfers Floor to Transfer   Floor to Transfer 6: Modified independent (Device/Increase time)   Comments Worked on stand to floor to hooklying and return to standing using mat table for assist; pt has pain with this normally with exercise class.     Lumbar Exercises: Stretches   Single Knee to Chest Stretch 1 rep;30 seconds   ITB Stretch 1  rep;30 seconds   ITB Stretch Limitations reviewed to ensure not too much hip adduction     Lumbar Exercises: Supine   AB Set Limitations Reviewed AB set and utilized in bed mobility and transfer training     Modalities   Modalities Electrical Stimulation;Iontophoresis     Electrical Stimulation   Electrical Stimulation Location R gluteals, piriformis and lateral quad   Electrical Stimulation Action IFC   Electrical Stimulation Parameters 80-150 Hz x 15 min   Electrical Stimulation Goals Pain;Tone     Iontophoresis   Type of Iontophoresis Dexamethasone   Location R greater trochanter area   Dose 58mAmin   Time 4 hour patch     Manual Therapy   Manual Therapy Soft tissue mobilization;Myofascial  release   Soft tissue mobilization to R gluteals and piriformis and lateral quad   Myofascial Release to R ITB          Trigger Point Dry Needling - 12/13/16 1253    Consent Given? Yes   Education Handout Provided No   Muscles Treated Lower Body Gluteus maximus;Quadriceps;Piriformis  R; and gluteus medius   Gluteus Maximus Response Twitch response elicited;Palpable increased muscle length  and glut med   Piriformis Response Twitch response elicited;Palpable increased muscle length   Quadriceps Response Twitch response elicited;Palpable increased muscle length  lateral quad R                   PT Long Term Goals - 12/13/16 1301      PT LONG TERM GOAL #1   Title Independent with a HEP.   Period Weeks   Status On-going     PT LONG TERM GOAL #2   Title Increase right hip abduction to 5/5.   Time 4   Period Weeks   Status On-going     PT LONG TERM GOAL #3   Title Sleep undisturbed 6 hours.   Time 4   Period Weeks   Status On-going     PT LONG TERM GOAL #4   Title Return to normal exercise regimen.   Time 4   Period Weeks   Status On-going               Plan - 12/13/16 1301    Clinical Impression Statement Patient reports some improvement following DN. She continued to have TPs in her gluteals and Piriformis muscles as well as along her R lateral quad and ITB. She had a good response to DN today in those areas. She reported pain with stand to floor to stand transfers so this was worked on after DN. She reported no pain after VCs for abdominal contraction with supine to SDLY. Her LTGs are ongoing. Patient plans to try an exercise class next week.   Clinical Presentation Evolving   Rehab Potential Excellent   PT Frequency 3x / week   PT Duration 4 weeks   PT Treatment/Interventions ADLs/Self Care Home Management;Cryotherapy;Electrical Stimulation;Iontophoresis 4mg /ml Dexamethasone;Moist Heat;Therapeutic activities;Therapeutic exercise;Patient/family  education;Neuromuscular re-education;Manual techniques;Dry needling   PT Next Visit Plan Assess DN; continue Ionto. E'stim to right hip prn; STW/M.  Right hip abduction strengthening.    PT Home Exercise Plan figure 4, SDLY ITB stretch   Consulted and Agree with Plan of Care Patient      Patient will benefit from skilled therapeutic intervention in order to improve the following deficits and impairments:  Decreased activity tolerance, Decreased strength, Pain  Visit Diagnosis: Pain in right hip  Muscle weakness (generalized)  Problem List Patient Active Problem List   Diagnosis Date Noted  . Sicca ([place]) 08/15/2016  . OSA on CPAP 06/10/2014  . Compliance with medication regimen 06/10/2014  . Snorings 11/04/2013  . Nocturia 11/04/2013  . Insomnia 11/04/2013  . Diabetes mellitus ([place]) 01/10/2012  . Essential hypertension, benign 04/23/2010  . ATRIAL SEPTAL DEFECT, HX OF 04/23/2010    Madelyn Flavors PT 12/13/2016, 1:09 PM  [facility] [address] [place], [place], [address] Phone: [phone]   Fax:  [phone]  Name: Julie Owen MRN: [id number] Date of Birth: Jan 27, 1946

## 2016-12-17 ENCOUNTER — Encounter: Payer: Self-pay | Admitting: Physical Therapy

## 2016-12-17 ENCOUNTER — Ambulatory Visit: Payer: Medicare Other | Admitting: Physical Therapy

## 2016-12-17 DIAGNOSIS — M25551 Pain in right hip: Secondary | ICD-10-CM | POA: Diagnosis not present

## 2016-12-17 DIAGNOSIS — M6281 Muscle weakness (generalized): Secondary | ICD-10-CM

## 2016-12-17 NOTE — Therapy (Signed)
[facility] [place], [place], [address] Phone: [phone]   Fax:  [phone]  Physical Therapy Treatment  Patient Details  Name: Julie Owen MRN: [id number] Date of Birth: [birth year] Referring Provider: Gerrit Halls PA-C.  Encounter Date: 12/17/2016      PT End of Session - 12/17/16 0950    Visit Number 5   Number of Visits 12   Date for PT Re-Evaluation 01/01/17   PT Start Time 0951   PT Stop Time 1041   PT Time Calculation (min) 50 min   Activity Tolerance Patient tolerated treatment well   Behavior During Therapy [facility] for tasks assessed/performed      Past Medical History:  Diagnosis Date  . Allergy   . Anxiety   . Atrial septal defect   . Depression   . Diabetes mellitus without complication ([place])   . Hip pain, right   . Hyperlipidemia   . Hypertension   . Neuromuscular disorder (HCC)    neuropathy  . Obesity   . Osteoarthritis of lumbar spine   . Osteoarthritis of thumb    bilateral  . Prediabetes   . Renal disorder    kidney stones  . Sleep apnea    uses CPAP    Past Surgical History:  Procedure Laterality Date  . BREAST REDUCTION SURGERY    . BREAST SURGERY  2008   Right breast  . BUNIONECTOMY  1993   Right foot  . CATARACT EXTRACTION    . CYSTOSCOPY W/ URETERAL STENT PLACEMENT Left 05/31/2015   Procedure: CYSTOSCOPY WITH STENT EXCHANGE;  Surgeon: Cleon Gustin, MD;  Location: [facility];  Service: Urology;  Laterality: Left;  . CYSTOSCOPY WITH RETROGRADE PYELOGRAM, URETEROSCOPY AND STENT PLACEMENT Left 05/19/2015   Procedure: CYSTOSCOPY WITH RETROGRADE PYELOGRAM, URETEROSCOPY AND STENT PLACEMENT;  Surgeon: Cleon Gustin, MD;  Location: [facility];  Service: Urology;  Laterality: Left;  . CYSTOSCOPY/RETROGRADE/URETEROSCOPY/STONE EXTRACTION WITH BASKET Left 05/31/2015   Procedure: CYSTOSCOPY/RETROGRADE/STONE EXTRACTION WITH BASKET;  Surgeon: Cleon Gustin, MD;  Location: [facility];   Service: Urology;  Laterality: Left;  . eyelid surgery Bilateral    Blephroplasty  . STONE EXTRACTION WITH BASKET Left 05/19/2015   Procedure: STONE EXTRACTION WITH BASKET;  Surgeon: Cleon Gustin, MD;  Location: [facility];  Service: Urology;  Laterality: Left;  . TONSILLECTOMY    . TOTAL HIP ARTHROPLASTY  08/2010   Right hip    There were no vitals filed for this visit.      Subjective Assessment - 12/17/16 0950    Subjective Reports that DN has helped as well as ionto patch. Has not been completing exercise class yet.   Pertinent History Right total hip replacement (08/24/10).   Limitations Walking   How long can you walk comfortably? Short community distances.   Patient Stated Goals Get back to exercising without right hip pain.   Currently in Pain? Other (Comment)  No pain assessment provided by patient upon arrival            [facility] PT Assessment - 12/17/16 0001      Assessment   Medical Diagnosis trochanteric bursitis of right hip.     Restrictions   Weight Bearing Restrictions No                     [facility] Adult PT Treatment/Exercise - 12/17/16 0001      Modalities   Modalities Electrical Stimulation;Iontophoresis     Electrical Stimulation  Electrical Stimulation Location R Proximal ITB   Electrical Stimulation Action Pre-Mod   Electrical Stimulation Parameters 80-150 hz x15 min   Electrical Stimulation Goals Pain;Tone     Iontophoresis   Type of Iontophoresis Dexamethasone   Location R greater trochanter area   Dose 53mAmin   Time 4 hour patch     Manual Therapy   Manual Therapy Passive ROM;Soft tissue mobilization   Soft tissue mobilization STW to R ITB/ lateral gute and Quad to reduce TPs and tightness   Passive ROM Passive R HS, SKTC, Piriformis, and ITB stretches 3x30 sec each                     PT Long Term Goals - 12/13/16 1301      PT LONG TERM GOAL #1   Title Independent with a HEP.   Period Weeks   Status  On-going     PT LONG TERM GOAL #2   Title Increase right hip abduction to 5/5.   Time 4   Period Weeks   Status On-going     PT LONG TERM GOAL #3   Title Sleep undisturbed 6 hours.   Time 4   Period Weeks   Status On-going     PT LONG TERM GOAL #4   Title Return to normal exercise regimen.   Time 4   Period Weeks   Status On-going               Plan - 12/17/16 1028    Clinical Impression Statement Patient presented in clinic with reports of improvement following DN sessions along proximal hip/ ITB. Greatest stretch sensation reported by patient with R Piriformis stretch passively. TPs and tightness still palpated predominately throughout proximal ITB and into lateral gluteals as well as lateral Quad. Normal modalities response noted following end of modalities session. Iontophoresis patch donned to R greater trochanter area with patient educated to remove in 4 hours.   Rehab Potential Excellent   PT Frequency 3x / week   PT Duration 4 weeks   PT Treatment/Interventions ADLs/Self Care Home Management;Cryotherapy;Electrical Stimulation;Iontophoresis 4mg /ml Dexamethasone;Moist Heat;Therapeutic activities;Therapeutic exercise;Patient/family education;Neuromuscular re-education;Manual techniques;Dry needling   PT Next Visit Plan Assess DN; continue Ionto. E'stim to right hip prn; STW/M.  Right hip abduction strengthening.    PT Home Exercise Plan figure 4, SDLY ITB stretch   Consulted and Agree with Plan of Care Patient      Patient will benefit from skilled therapeutic intervention in order to improve the following deficits and impairments:  Decreased activity tolerance, Decreased strength, Pain  Visit Diagnosis: Pain in right hip  Muscle weakness (generalized)     Problem List Patient Active Problem List   Diagnosis Date Noted  . Sicca ([place]) 08/15/2016  . OSA on CPAP 06/10/2014  . Compliance with medication regimen 06/10/2014  . Snorings 11/04/2013  . Nocturia  11/04/2013  . Insomnia 11/04/2013  . Diabetes mellitus ([place]) 01/10/2012  . Essential hypertension, benign 04/23/2010  . ATRIAL SEPTAL DEFECT, HX OF 04/23/2010    Wynelle Fanny, PTA 12/17/2016, 10:44 AM  [facility] [address] [place], [place], [address] Phone: [phone]   Fax:  [phone]  Name: Julie Owen MRN: [id number] Date of Birth: [birth year]

## 2016-12-19 ENCOUNTER — Ambulatory Visit: Payer: Medicare Other

## 2016-12-19 DIAGNOSIS — M6281 Muscle weakness (generalized): Secondary | ICD-10-CM

## 2016-12-19 DIAGNOSIS — M25551 Pain in right hip: Secondary | ICD-10-CM | POA: Diagnosis not present

## 2016-12-19 NOTE — Therapy (Signed)
[facility] [place], [place], [address] Phone: [phone]   Fax:  [phone]  Physical Therapy Treatment  Patient Details  Name: Julie Owen MRN: [id number] Date of Birth: 07-11-1945 Referring Provider: Gerrit Halls PA-C.  Encounter Date: 12/19/2016      PT End of Session - 12/19/16 1333    Visit Number 6   Number of Visits 12   Date for PT Re-Evaluation 01/01/17   PT Start Time 1275   PT Stop Time 1415   PT Time Calculation (min) 60 min   Activity Tolerance Patient tolerated treatment well   Behavior During Therapy [facility] for tasks assessed/performed      Past Medical History:  Diagnosis Date  . Allergy   . Anxiety   . Atrial septal defect   . Depression   . Diabetes mellitus without complication ([place])   . Hip pain, right   . Hyperlipidemia   . Hypertension   . Neuromuscular disorder (HCC)    neuropathy  . Obesity   . Osteoarthritis of lumbar spine   . Osteoarthritis of thumb    bilateral  . Prediabetes   . Renal disorder    kidney stones  . Sleep apnea    uses CPAP    Past Surgical History:  Procedure Laterality Date  . BREAST REDUCTION SURGERY    . BREAST SURGERY  2008   Right breast  . BUNIONECTOMY  1993   Right foot  . CATARACT EXTRACTION    . CYSTOSCOPY W/ URETERAL STENT PLACEMENT Left 05/31/2015   Procedure: CYSTOSCOPY WITH STENT EXCHANGE;  Surgeon: Cleon Gustin, MD;  Location: [facility];  Service: Urology;  Laterality: Left;  . CYSTOSCOPY WITH RETROGRADE PYELOGRAM, URETEROSCOPY AND STENT PLACEMENT Left 05/19/2015   Procedure: CYSTOSCOPY WITH RETROGRADE PYELOGRAM, URETEROSCOPY AND STENT PLACEMENT;  Surgeon: Cleon Gustin, MD;  Location: [facility];  Service: Urology;  Laterality: Left;  . CYSTOSCOPY/RETROGRADE/URETEROSCOPY/STONE EXTRACTION WITH BASKET Left 05/31/2015   Procedure: CYSTOSCOPY/RETROGRADE/STONE EXTRACTION WITH BASKET;  Surgeon: Cleon Gustin, MD;  Location: [facility];   Service: Urology;  Laterality: Left;  . eyelid surgery Bilateral    Blephroplasty  . STONE EXTRACTION WITH BASKET Left 05/19/2015   Procedure: STONE EXTRACTION WITH BASKET;  Surgeon: Cleon Gustin, MD;  Location: [facility];  Service: Urology;  Laterality: Left;  . TONSILLECTOMY    . TOTAL HIP ARTHROPLASTY  08/2010   Right hip    There were no vitals filed for this visit.      Subjective Assessment - 12/19/16 1303    Subjective Pt. notes she feels DN helped her and that ionto patches continue to help.  Still with R hip pain while laying on R side in bed and in while walking.     Patient Stated Goals Get back to exercising without right hip pain.   Currently in Pain? No/denies   Pain Score 0-No pain   Multiple Pain Sites No                         [facility] Adult PT Treatment/Exercise - 12/19/16 1745      Lumbar Exercises: Stretches   Passive Hamstring Stretch 1 rep;30 seconds   Passive Hamstring Stretch Limitations R    Single Knee to Chest Stretch 1 rep;30 seconds   Single Knee to Chest Stretch Limitations R    ITB Stretch 30 seconds;2 reps   ITB Stretch Limitations R with strap; sidelying  Piriformis Stretch 30 seconds;2 reps   Piriformis Stretch Limitations R side only      Knee/Hip Exercises: Aerobic   Nustep Lvl 1, 8 min      Electrical Stimulation   Electrical Stimulation Location R Proximal ITB   Electrical Stimulation Action IFC   Electrical Stimulation Parameters 80-150Hz , intensity to pt. tolerance, 15'    Electrical Stimulation Goals Pain;Tone     Iontophoresis   Type of Iontophoresis Dexamethasone   Location R greater trochanter area   Dose 39mAmin   Time 4 hour patch     Manual Therapy   Manual Therapy Passive ROM;Soft tissue mobilization   Soft tissue mobilization STW to R ITB/ lateral gute and Quad to reduce TPs and tightness   Myofascial Release TPR to R ITB; very tender    Passive ROM Passive R HS, SKTC, Piriformis, and ITB stretches  3x30 sec each                     PT Long Term Goals - 12/13/16 1301      PT LONG TERM GOAL #1   Title Independent with a HEP.   Period Weeks   Status On-going     PT LONG TERM GOAL #2   Title Increase right hip abduction to 5/5.   Time 4   Period Weeks   Status On-going     PT LONG TERM GOAL #3   Title Sleep undisturbed 6 hours.   Time 4   Period Weeks   Status On-going     PT LONG TERM GOAL #4   Title Return to normal exercise regimen.   Time 4   Period Weeks   Status On-going               Plan - 12/19/16 1333    Clinical Impression Statement Pt. still with palpable/tender TP's in proximal/lateral ITB/HS area.  Manual TPR to this area today with mild relief following this.  LE stretching to proximal hip musculature and ionto patch applied to R hip as pt. notes this is helping.  Treatment ending with E-stim to R hip to decrease post-exercise tone/pain.  Pt. noting she has less tenderness over R "hip bone" however is most tender in area of thigh "where therapists have worked in past visits".  Tolerated Nustep warm-up today well and may be ready for gentle hip strengthening in coming visits.     PT Treatment/Interventions ADLs/Self Care Home Management;Cryotherapy;Electrical Stimulation;Iontophoresis 4mg /ml Dexamethasone;Moist Heat;Therapeutic activities;Therapeutic exercise;Patient/family education;Neuromuscular re-education;Manual techniques;Dry needling   PT Next Visit Plan continue Ionto. E'stim to right hip prn; STW/M.  Right hip abduction strengthening.       Patient will benefit from skilled therapeutic intervention in order to improve the following deficits and impairments:  Decreased activity tolerance, Decreased strength, Pain  Visit Diagnosis: Pain in right hip  Muscle weakness (generalized)     Problem List Patient Active Problem List   Diagnosis Date Noted  . Sicca ([place]) 08/15/2016  . OSA on CPAP 06/10/2014  . Compliance with  medication regimen 06/10/2014  . Snorings 11/04/2013  . Nocturia 11/04/2013  . Insomnia 11/04/2013  . Diabetes mellitus ([place]) 01/10/2012  . Essential hypertension, benign 04/23/2010  . ATRIAL SEPTAL DEFECT, HX OF 04/23/2010    Bess Harvest, PTA 12/19/16 5:54 PM  [facility] [place], [place], [address] Phone: [phone]   Fax:  [phone]  Name: Julie Owen MRN: [id number] Date of Birth: Apr 08, 1946

## 2016-12-23 ENCOUNTER — Encounter: Payer: Self-pay | Admitting: Physical Therapy

## 2016-12-23 ENCOUNTER — Ambulatory Visit: Payer: Medicare Other | Admitting: Physical Therapy

## 2016-12-23 DIAGNOSIS — M25551 Pain in right hip: Secondary | ICD-10-CM | POA: Diagnosis not present

## 2016-12-23 DIAGNOSIS — M6281 Muscle weakness (generalized): Secondary | ICD-10-CM

## 2016-12-23 NOTE — Therapy (Signed)
[facility] [place], [place], [address] Phone: [phone]   Fax:  [phone]  Physical Therapy Treatment  Patient Details  Name: Julie Owen MRN: [id number] Date of Birth: 14-Dec-1945 Referring Provider: Gerrit Halls PA-C.  Encounter Date: 12/23/2016      PT End of Session - 12/23/16 1344    Visit Number 7   Number of Visits 12   Date for PT Re-Evaluation 01/01/17   PT Start Time 1300   PT Stop Time 1359   PT Time Calculation (min) 59 min   Activity Tolerance Patient tolerated treatment well   Behavior During Therapy [facility] for tasks assessed/performed      Past Medical History:  Diagnosis Date  . Allergy   . Anxiety   . Atrial septal defect   . Depression   . Diabetes mellitus without complication ([place])   . Hip pain, right   . Hyperlipidemia   . Hypertension   . Neuromuscular disorder (HCC)    neuropathy  . Obesity   . Osteoarthritis of lumbar spine   . Osteoarthritis of thumb    bilateral  . Prediabetes   . Renal disorder    kidney stones  . Sleep apnea    uses CPAP    Past Surgical History:  Procedure Laterality Date  . BREAST REDUCTION SURGERY    . BREAST SURGERY  2008   Right breast  . BUNIONECTOMY  1993   Right foot  . CATARACT EXTRACTION    . CYSTOSCOPY W/ URETERAL STENT PLACEMENT Left 05/31/2015   Procedure: CYSTOSCOPY WITH STENT EXCHANGE;  Surgeon: Cleon Gustin, MD;  Location: [facility];  Service: Urology;  Laterality: Left;  . CYSTOSCOPY WITH RETROGRADE PYELOGRAM, URETEROSCOPY AND STENT PLACEMENT Left 05/19/2015   Procedure: CYSTOSCOPY WITH RETROGRADE PYELOGRAM, URETEROSCOPY AND STENT PLACEMENT;  Surgeon: Cleon Gustin, MD;  Location: [facility];  Service: Urology;  Laterality: Left;  . CYSTOSCOPY/RETROGRADE/URETEROSCOPY/STONE EXTRACTION WITH BASKET Left 05/31/2015   Procedure: CYSTOSCOPY/RETROGRADE/STONE EXTRACTION WITH BASKET;  Surgeon: Cleon Gustin, MD;  Location: [facility];   Service: Urology;  Laterality: Left;  . eyelid surgery Bilateral    Blephroplasty  . STONE EXTRACTION WITH BASKET Left 05/19/2015   Procedure: STONE EXTRACTION WITH BASKET;  Surgeon: Cleon Gustin, MD;  Location: [facility];  Service: Urology;  Laterality: Left;  . TONSILLECTOMY    . TOTAL HIP ARTHROPLASTY  08/2010   Right hip    There were no vitals filed for this visit.      Subjective Assessment - 12/23/16 1302    Subjective Patient reported minimal relief overal, ongoing discomfort with any prolong activity   Pertinent History Right total hip replacement (08/24/10).   How long can you walk comfortably? Short community distances.   Patient Stated Goals Get back to exercising without right hip pain.   Currently in Pain? No/denies                         [facility] Adult PT Treatment/Exercise - 12/23/16 0001      Knee/Hip Exercises: Aerobic   Nustep x40min L3-4     Knee/Hip Exercises: Standing   Heel Raises Both;10 reps   Hip ADduction Strengthening;Right;1 set;10 reps   Lateral Step Up Right;1 set;10 reps;Step Height: 6"   Forward Step Up Right;1 set;10 reps;Step Height: 6"     Knee/Hip Exercises: Supine   Other Supine Knee/Hip Exercises hib abd with yellow t-band x30     Moist  Heat Therapy   Number Minutes Moist Heat 15 Minutes   Moist Heat Location Hip     Electrical Stimulation   Electrical Stimulation Location R Proximal ITB   Electrical Stimulation Action IFC   Electrical Stimulation Parameters 80-150hz  x15   Electrical Stimulation Goals Pain;Tone     Iontophoresis   Type of Iontophoresis Dexamethasone   Location R greater trochanter area   Dose 92mAmin   Time 4 hour patch     Manual Therapy   Manual Therapy Soft tissue mobilization   Soft tissue mobilization STW to R ITB/ lateral gute and Quad to reduce TPs and tightness                     PT Long Term Goals - 12/23/16 1347      PT LONG TERM GOAL #1   Title Independent with a  HEP.   Time 4   Period Weeks   Status On-going     PT LONG TERM GOAL #2   Title Increase right hip abduction to 5/5.   Time 4   Period Weeks   Status On-going     PT LONG TERM GOAL #3   Title Sleep undisturbed 6 hours.   Time 4   Period Weeks   Status On-going     PT LONG TERM GOAL #4   Title Return to normal exercise regimen.   Time 4   Period Weeks   Status On-going               Plan - 12/23/16 1347    Clinical Impression Statement Patient tolerated treatment well today. Patient has ongoing palpable pain lateral ITB area. Patient has reported some improvement overall yet no lasting results. Patient ablew to progress gentle strengthening today with no pain complaints. Patient is going back to [place] next week for possible shot in hip. Patient current goals ongoing due to pain deficts.    Rehab Potential Excellent   PT Frequency 3x / week   PT Duration 4 weeks   PT Treatment/Interventions ADLs/Self Care Home Management;Cryotherapy;Electrical Stimulation;Iontophoresis 4mg /ml Dexamethasone;Moist Heat;Therapeutic activities;Therapeutic exercise;Patient/family education;Neuromuscular re-education;Manual techniques;Dry needling   PT Next Visit Plan continue with POC and issue HEP/bands   Consulted and Agree with Plan of Care Patient      Patient will benefit from skilled therapeutic intervention in order to improve the following deficits and impairments:  Decreased activity tolerance, Decreased strength, Pain  Visit Diagnosis: Pain in right hip  Muscle weakness (generalized)     Problem List Patient Active Problem List   Diagnosis Date Noted  . Sicca ([place]) 08/15/2016  . OSA on CPAP 06/10/2014  . Compliance with medication regimen 06/10/2014  . Snorings 11/04/2013  . Nocturia 11/04/2013  . Insomnia 11/04/2013  . Diabetes mellitus ([place]) 01/10/2012  . Essential hypertension, benign 04/23/2010  . ATRIAL SEPTAL DEFECT, HX OF 04/23/2010    Jeweliana Dudgeon P,  PTA 12/23/2016, 2:00 PM  [facility] [place], [place], [address] Phone: [phone]   Fax:  [phone]  Name: Julie Owen MRN: [id number] Date of Birth: 1946-01-22

## 2016-12-26 ENCOUNTER — Encounter: Payer: Self-pay | Admitting: Physical Therapy

## 2016-12-26 ENCOUNTER — Ambulatory Visit: Payer: Medicare Other | Admitting: Physical Therapy

## 2016-12-26 DIAGNOSIS — M25551 Pain in right hip: Secondary | ICD-10-CM | POA: Diagnosis not present

## 2016-12-26 DIAGNOSIS — M6281 Muscle weakness (generalized): Secondary | ICD-10-CM

## 2016-12-26 NOTE — Patient Instructions (Addendum)
Strengthening: Straight Leg Raise (Phase 1)    Tighten muscles on front of right thigh, then lift leg _5___ inches from surface, keeping knee locked.  Repeat _10___ times per set. Do __1-2__ sets per session. Do __1__ sessions per day.   Strengthening: Hip Extension - Resisted    With tubing around right ankle, face anchor and pull leg straight back. Repeat __10__ times per set. Do __1-2_ sets per session. Do __1 sessions per day.   Strengthening: Hip Abduction (Side-Lying)    Tighten muscles on front of right thigh, then lift leg __5__ inches from surface, keeping knee locked.  Repeat __10__ times per set. Do __1-2__ sets per session. Do _1___ sessions per day.   Strengthening: Hip Abductor - Resisted    With band looped around both legs above knees, push thighs apart. Repeat _10___ times per set. Do _2-3___ sets per session. Do _1___ sessions per day.   Bridging    Slowly raise buttocks from floor, keeping stomach tight. Repeat _10___ times per set. Do __2__ sets per session. Do _1___ sessions per day.   Heel Raise: Bilateral (Standing)    Rise on balls of feet. Repeat __10__ times per set. Do _2___ sets per session. Do _1__ sessions per day.

## 2016-12-26 NOTE — Therapy (Addendum)
[facility] [place], [place], [address] Phone: [phone]   Fax:  [phone]  Physical Therapy Treatment  Patient Details  Name: Julie Owen MRN: [id number] Date of Birth: 1945/11/06 Referring Provider: Gerrit Halls PA-C.  Encounter Date: 12/26/2016    Past Medical History:  Diagnosis Date  . Allergy   . Anxiety   . Atrial septal defect   . Depression   . Diabetes mellitus without complication ([place])   . Hip pain, right   . Hyperlipidemia   . Hypertension   . Neuromuscular disorder (HCC)    neuropathy  . Obesity   . Osteoarthritis of lumbar spine   . Osteoarthritis of thumb    bilateral  . Prediabetes   . Renal disorder    kidney stones  . Sleep apnea    uses CPAP    Past Surgical History:  Procedure Laterality Date  . BREAST REDUCTION SURGERY    . BREAST SURGERY  2008   Right breast  . BUNIONECTOMY  1993   Right foot  . CATARACT EXTRACTION    . CYSTOSCOPY W/ URETERAL STENT PLACEMENT Left 05/31/2015   Procedure: CYSTOSCOPY WITH STENT EXCHANGE;  Surgeon: Cleon Gustin, MD;  Location: [facility];  Service: Urology;  Laterality: Left;  . CYSTOSCOPY WITH RETROGRADE PYELOGRAM, URETEROSCOPY AND STENT PLACEMENT Left 05/19/2015   Procedure: CYSTOSCOPY WITH RETROGRADE PYELOGRAM, URETEROSCOPY AND STENT PLACEMENT;  Surgeon: Cleon Gustin, MD;  Location: [facility];  Service: Urology;  Laterality: Left;  . CYSTOSCOPY/RETROGRADE/URETEROSCOPY/STONE EXTRACTION WITH BASKET Left 05/31/2015   Procedure: CYSTOSCOPY/RETROGRADE/STONE EXTRACTION WITH BASKET;  Surgeon: Cleon Gustin, MD;  Location: [facility];  Service: Urology;  Laterality: Left;  . eyelid surgery Bilateral    Blephroplasty  . STONE EXTRACTION WITH BASKET Left 05/19/2015   Procedure: STONE EXTRACTION WITH BASKET;  Surgeon: Cleon Gustin, MD;  Location: [facility];  Service: Urology;  Laterality: Left;  . TONSILLECTOMY    . TOTAL HIP ARTHROPLASTY  08/2010    Right hip    There were no vitals filed for this visit.                                    PT Long Term Goals - 12/26/16 1315      PT LONG TERM GOAL #1   Title Independent with a HEP.   Time 4   Period Weeks   Status Achieved     PT LONG TERM GOAL #2   Title Increase right hip abduction to 5/5.   Time 4   Period Weeks   Status Not Met     PT LONG TERM GOAL #3   Title Sleep undisturbed 6 hours.   Time 4   Period Weeks   Status Achieved  12/26/16     PT LONG TERM GOAL #4   Title Return to normal exercise regimen.   Time 4   Period Weeks   Status Not Met  12/26/16             Patient will benefit from skilled therapeutic intervention in order to improve the following deficits and impairments:  Decreased activity tolerance, Decreased strength, Pain  Visit Diagnosis: Pain in right hip  Muscle weakness (generalized)       G-Codes - January 24, 2017 0933    Functional Assessment Tool Used (Outpatient Only) FOTO...44% limitation.....discharge.   Functional Limitation Mobility: Walking and moving around  Mobility: Walking and Moving Around Current Status [phone]) At least 40 percent but less than 60 percent impaired, limited or restricted   Mobility: Walking and Moving Around Goal Status [phone]) At least 1 percent but less than 20 percent impaired, limited or restricted   Mobility: Walking and Moving Around Discharge Status [phone]) At least 40 percent but less than 60 percent impaired, limited or restricted      Problem List Patient Active Problem List   Diagnosis Date Noted  . Sicca ([place]) 08/15/2016  . OSA on CPAP 06/10/2014  . Compliance with medication regimen 06/10/2014  . Snorings 11/04/2013  . Nocturia 11/04/2013  . Insomnia 11/04/2013  . Diabetes mellitus ([place]) 01/10/2012  . Essential hypertension, benign 04/23/2010  . ATRIAL SEPTAL DEFECT, HX OF 04/23/2010    Ladean Raya, PTA 01/03/17 9:34 AM  [facility] [place], [place], [address] Phone: [phone]   Fax:  [phone]  Name: Julie Owen MRN: [id number] Date of Birth: October 22, 1945  PHYSICAL THERAPY DISCHARGE SUMMARY  Visits from Start of Care:   Current functional level related to goals / functional outcomes: See above.   Remaining deficits: Very good progress with a minimal hip strength deficit remaining.   Education / Equipment: HEP. Plan: Patient agrees to discharge.  Patient goals were partially met. Patient is being discharged due to being pleased with the current functional level.  ?????          Mali Applegate MPT

## 2017-01-03 DIAGNOSIS — M25551 Pain in right hip: Secondary | ICD-10-CM | POA: Diagnosis not present

## 2017-02-05 ENCOUNTER — Other Ambulatory Visit (HOSPITAL_COMMUNITY): Payer: Self-pay | Admitting: Internal Medicine

## 2017-02-05 DIAGNOSIS — Z1231 Encounter for screening mammogram for malignant neoplasm of breast: Secondary | ICD-10-CM

## 2017-02-13 ENCOUNTER — Encounter (HOSPITAL_COMMUNITY): Payer: Self-pay

## 2017-02-13 ENCOUNTER — Ambulatory Visit (HOSPITAL_COMMUNITY)
Admission: RE | Admit: 2017-02-13 | Discharge: 2017-02-13 | Disposition: A | Discharge status: Home / Self Care | Payer: Medicare Other | Source: Ambulatory Visit | Attending: Internal Medicine | Admitting: Internal Medicine

## 2017-02-13 DIAGNOSIS — Z1231 Encounter for screening mammogram for malignant neoplasm of breast: Secondary | ICD-10-CM | POA: Insufficient documentation

## 2017-02-20 ENCOUNTER — Other Ambulatory Visit (HOSPITAL_COMMUNITY): Payer: Self-pay | Admitting: Internal Medicine

## 2017-02-20 DIAGNOSIS — R928 Other abnormal and inconclusive findings on diagnostic imaging of breast: Secondary | ICD-10-CM

## 2017-02-25 ENCOUNTER — Ambulatory Visit (HOSPITAL_COMMUNITY)
Admission: RE | Admit: 2017-02-25 | Discharge: 2017-02-25 | Disposition: A | Discharge status: Home / Self Care | Payer: Medicare Other | Source: Ambulatory Visit | Attending: Internal Medicine | Admitting: Internal Medicine

## 2017-02-25 DIAGNOSIS — R928 Other abnormal and inconclusive findings on diagnostic imaging of breast: Secondary | ICD-10-CM | POA: Diagnosis present

## 2017-05-07 ENCOUNTER — Encounter: Payer: Self-pay | Admitting: Cardiovascular Disease

## 2017-05-07 ENCOUNTER — Ambulatory Visit: Payer: Medicare Other | Admitting: Cardiovascular Disease

## 2017-05-07 ENCOUNTER — Telehealth: Payer: Self-pay | Admitting: Cardiovascular Disease

## 2017-05-07 VITALS — BP 132/80 | HR 56 | Ht 65.0 in | Wt 177.0 lb

## 2017-05-07 DIAGNOSIS — I779 Disorder of arteries and arterioles, unspecified: Secondary | ICD-10-CM | POA: Diagnosis not present

## 2017-05-07 DIAGNOSIS — Q2112 Patent foramen ovale: Secondary | ICD-10-CM

## 2017-05-07 DIAGNOSIS — I10 Essential (primary) hypertension: Secondary | ICD-10-CM | POA: Diagnosis not present

## 2017-05-07 DIAGNOSIS — I739 Peripheral vascular disease, unspecified: Secondary | ICD-10-CM

## 2017-05-07 DIAGNOSIS — E7800 Pure hypercholesterolemia, unspecified: Secondary | ICD-10-CM | POA: Diagnosis not present

## 2017-05-07 DIAGNOSIS — Q211 Atrial septal defect, unspecified: Secondary | ICD-10-CM

## 2017-05-07 NOTE — Telephone Encounter (Signed)
Pre-cert Verification for the following procedure   Echo limited bubble study scheduled for 05-12-2017 at [facility]

## 2017-05-07 NOTE — Patient Instructions (Addendum)
Medication Instructions:  Continue all current medications.  Labwork: none  Testing/Procedures:  Your physician has requested that you have an echocardiogram. Echocardiography is a painless test that uses sound waves to create images of your heart. It provides your doctor with information about the size and shape of your heart and how well your heart's chambers and valves are working. This procedure takes approximately one hour. There are no restrictions for this procedure. - bubble study.   Office will contact with results via phone or letter.    Follow-Up: Your physician wants you to follow up in:  1 year.  You will receive a reminder letter in the mail one-two months in advance.  If you don't receive a letter, please call our office to schedule the follow up appointment   Any Other Special Instructions Will Be Listed Below (If Applicable).  If you need a refill on your cardiac medications before your next appointment, please call your pharmacy.

## 2017-05-07 NOTE — Progress Notes (Signed)
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 There were plans for percutaneous closure by a Dr. Dwain Sarna in June/July 2006. However, this was not seen by her most recent echo with bubble study in 2013. She refused to undergo a cardiac MRI due to fears of dye allergy. She also has hypertension, hyperlipidemia, mild sleep apnea, and carotid artery disease.  She has had a very difficult year and is tearful when describing it.  She has struggled with right hip bursitis for several months until she received an injection.  She had been unable to exercise or sleep on either side.  The symptoms have improved.  Her dog of several years had to be put to sleep last week.  Her husband was diagnosed with pulmonary fibrosis.  Her sister was diagnosed with breast cancer and is receiving radiation therapy.  She underwent an abnormal mammogram and ultrasound with evidence of probable left breast fat necrosis.  She is scheduled for follow-up in the next several weeks.  She has been stress eating and has put on 20 pounds since her last visit with me.  She plans to begin exercising next week.  She denies exertional chest pain and shortness of breath.  She denies palpitations and leg swelling.  ECG performed today which I personally interpreted demonstrated sinus bradycardia 58 bpm.  There were no ischemic ST segment or T wave abnormalities.   Review of Systems: As per "subjective", otherwise negative.  Allergies  Allergen Reactions  . Aspirin Other (See Comments)    Abdominal pain  . Benzocaine   . Caffeine     Panic attacks  . Codeine Swelling  . Lidocaine-Epinephrine Other (See Comments)  . Oxycodone-Acetaminophen Swelling  . Percocet [Oxycodone-Acetaminophen] Swelling  . Procaine Hcl   . Pseudoephedrine Hcl Er Other (See Comments)    Jittery, nervousness  . Simvastatin     REACTION: hair loss    Current Outpatient  Medications  Medication Sig Dispense Refill  . Alpha-Lipoic Acid 600 MG CAPS Take 600 mg by mouth at bedtime.    Marland Kitchen antiseptic oral rinse (BIOTENE) LIQD 15 mLs by Mouth Rinse route as needed for dry mouth.    . metFORMIN (GLUCOPHAGE-XR) 500 MG 24 hr tablet Take 500 mg by mouth at bedtime.     . metroNIDAZOLE (METROGEL) 0.75 % gel Apply 1 application topically 2 (two) times daily.    . Misc. Devices MISC CPAP    . Polyethyl Glycol-Propyl Glycol (SYSTANE OP) Apply to eye.    . pravastatin (PRAVACHOL) 20 MG tablet Take 20 mg by mouth daily.    . TURMERIC PO Take 1 tablet by mouth daily.     Current Facility-Administered Medications  Medication Dose Route Frequency Provider Last Rate Last Dose  . 0.9 %  sodium chloride infusion  500 mL Intravenous Continuous Pyrtle, Lajuan Lines, MD        Past Medical History:  Diagnosis Date  . Allergy   . Anxiety   . Atrial septal defect   . Depression   . Diabetes mellitus without complication ([place])   . Hip pain, right   . Hyperlipidemia   . Hypertension   . Neuromuscular disorder (HCC)    neuropathy  . Obesity   . Osteoarthritis of lumbar spine   . Osteoarthritis of thumb    bilateral  . Prediabetes   . Renal disorder    kidney stones  . Sleep  apnea    uses CPAP    Past Surgical History:  Procedure Laterality Date  . BREAST REDUCTION SURGERY    . BREAST SURGERY  2008   Right breast  . BUNIONECTOMY  1993   Right foot  . CATARACT EXTRACTION    . CYSTOSCOPY W/ URETERAL STENT PLACEMENT Left 05/31/2015   Procedure: CYSTOSCOPY WITH STENT EXCHANGE;  Surgeon: Cleon Gustin, MD;  Location: [facility];  Service: Urology;  Laterality: Left;  . CYSTOSCOPY WITH RETROGRADE PYELOGRAM, URETEROSCOPY AND STENT PLACEMENT Left 05/19/2015   Procedure: CYSTOSCOPY WITH RETROGRADE PYELOGRAM, URETEROSCOPY AND STENT PLACEMENT;  Surgeon: Cleon Gustin, MD;  Location: [facility];  Service: Urology;  Laterality: Left;  . CYSTOSCOPY/RETROGRADE/URETEROSCOPY/STONE  EXTRACTION WITH BASKET Left 05/31/2015   Procedure: CYSTOSCOPY/RETROGRADE/STONE EXTRACTION WITH BASKET;  Surgeon: Cleon Gustin, MD;  Location: [facility];  Service: Urology;  Laterality: Left;  . eyelid surgery Bilateral    Blephroplasty  . REDUCTION MAMMAPLASTY Bilateral 2003  . STONE EXTRACTION WITH BASKET Left 05/19/2015   Procedure: STONE EXTRACTION WITH BASKET;  Surgeon: Cleon Gustin, MD;  Location: [facility];  Service: Urology;  Laterality: Left;  . TONSILLECTOMY    . TOTAL HIP ARTHROPLASTY  08/2010   Right hip    Social History   Socioeconomic History  . Marital status: Married    Spouse name: Gwyndolyn Saxon  . Number of children: 0  . Years of education: Master's  . Highest education level: Not on file  Social Needs  . Financial resource strain: Not on file  . Food insecurity - worry: Not on file  . Food insecurity - inability: Not on file  . Transportation needs - medical: Not on file  . Transportation needs - non-medical: Not on file  Occupational History  . Not on file  Tobacco Use  . Smoking status: Former Smoker    Packs/day: 0.50    Years: 10.00    Pack years: 5.00    Types: Cigarettes    Start date: 12/11/1965    Last attempt to quit: 07/30/1975    Years since quitting: 41.8  . Smokeless tobacco: Never Used  Substance and Sexual Activity  . Alcohol use: No    Alcohol/week: 0.0 oz  . Drug use: No  . Sexual activity: Not on file  Other Topics Concern  . Not on file  Social History Narrative   Patient is married Gwyndolyn Saxon) and lives at home with her husband.   Patient is a retired Pharmacist, hospital.   Patient has a Master's degree.   Patient is right-handed.   Patient does not drink any caffeine.     Vitals:   05/07/17 0857  BP: 132/80  Pulse: (!) 56  SpO2: 97%  Weight: 177 lb (80.3 kg)  Height: 5\' 5"  (1.651 m)    Wt Readings from Last 3 Encounters:  05/07/17 177 lb (80.3 kg)  08/15/16 160 lb (72.6 kg)  05/15/16 157 lb (71.2 kg)     PHYSICAL  EXAM General: NAD HEENT: Normal. Neck: No JVD, no thyromegaly. Lungs: Clear to auscultation bilaterally with normal respiratory effort. CV: Regular rate and rhythm, normal S1/S2, no Z1/I9, soft 1/6 systolic murmur heard throughout precordium. No pretibial or periankle edema.  No carotid bruit.   Abdomen: Soft, nontender, no distention.  Neurologic: Alert and oriented.  Psych: Tearful. Skin: Normal. Musculoskeletal: No gross deformities.    ECG: Most recent ECG reviewed.   Labs: Lab Results  Component Value Date/Time   K 3.9 05/18/2015 08:01 AM  BUN 21 (H) 05/18/2015 08:01 AM   CREATININE 1.18 (H) 05/18/2015 08:01 AM   ALT 43 05/11/2015 11:32 PM   HGB 13.2 05/18/2015 08:01 AM     Lipids: Lab Results  Component Value Date/Time   LDLDIRECT 206.0 01/10/2012 11:24 AM   CHOL 285 (H) 01/10/2012 11:24 AM   TRIG 198.0 (H) 01/10/2012 11:24 AM   HDL 54.60 01/10/2012 11:24 AM       ASSESSMENT AND PLAN: 1. Atrial septal defect/low secundum ASD: Symptomatically stable.This was not seen by her most recent echo with bubble study in 2013, although the patient previously told me that the technician did note some bubbles crossing over, albeit not a large amount. This was not documented on the official report. This diagnosis has impacted her life since 1980, in particular her finances with respect to repeated procedures and her life insurance premiums.  In order to definitively answer this question, I previously ordered a cardiac MRI but she was unwilling to proceed. I informed her that I would make sure she is adequately premedicated with steroids and antihistamines prior to undergoing a cardiac MRI should she decide to proceed with it. I also previously discussed the option of right heart catheterization with a saturation run for confirmatory purposes. I will obtain a repeat echocardiogram with bubble study to evaluate for a patent foramen ovale/atrial septal defect.  2. Essential HTN:  Controlled with weight loss. No longer on Cozaar.  3. Hyperlipidemia: She takes pravastatin three times per week.  4. Bilateral carotid artery disease: Documented as mild. I do not appreciate any bruits. I would consider Dopplers in the future.  5. Sleep apnea: On CPAP.      Disposition: Follow up 1 year.   Kate Sable, M.D., F.A.C.C.

## 2017-05-12 ENCOUNTER — Ambulatory Visit (HOSPITAL_COMMUNITY)
Admission: RE | Admit: 2017-05-12 | Discharge: 2017-05-12 | Disposition: A | Discharge status: Home / Self Care | Payer: Medicare Other | Source: Ambulatory Visit | Attending: Cardiovascular Disease | Admitting: Cardiovascular Disease

## 2017-05-12 ENCOUNTER — Other Ambulatory Visit: Payer: Self-pay | Admitting: Cardiovascular Disease

## 2017-05-12 DIAGNOSIS — E119 Type 2 diabetes mellitus without complications: Secondary | ICD-10-CM | POA: Insufficient documentation

## 2017-05-12 DIAGNOSIS — I10 Essential (primary) hypertension: Secondary | ICD-10-CM | POA: Insufficient documentation

## 2017-05-12 DIAGNOSIS — G4733 Obstructive sleep apnea (adult) (pediatric): Secondary | ICD-10-CM | POA: Insufficient documentation

## 2017-05-12 DIAGNOSIS — Q211 Atrial septal defect: Secondary | ICD-10-CM

## 2017-05-12 DIAGNOSIS — Q2112 Patent foramen ovale: Secondary | ICD-10-CM

## 2017-05-12 LAB — ECHOCARDIOGRAM COMPLETE BUBBLE STUDY
AVLVOTPG: 4 mmHg
CHL CUP DOP CALC LVOT VTI: 25.8 cm
CHL CUP STROKE VOLUME: 33 mL
E decel time: 187 msec
EERAT: 8.86
FS: 35 % (ref 28–44)
IV/PV OW: 0.99
LA ID, A-P, ES: 38 mm
LA diam index: 1.96 cm/m2
LAVOL: 40.9 mL
LAVOLA4C: 50.8 mL
LAVOLIN: 21.1 mL/m2
LEFT ATRIUM END SYS DIAM: 38 mm
LV SIMPSON'S DISK: 62
LV dias vol index: 27 mL/m2
LV dias vol: 53 mL (ref 46–106)
LV sys vol index: 10 mL/m2
LVEEAVG: 8.86
LVEEMED: 8.86
LVELAT: 8.27 cm/s
LVOT SV: 66 mL
LVOT area: 2.54 cm2
LVOT diameter: 18 mm
LVOTPV: 101 cm/s
LVSYSVOL: 20 mL (ref 14–42)
MV Dec: 187
MV pk A vel: 87.9 m/s
MV pk E vel: 73.3 m/s
MVPG: 2 mmHg
PW: 9.31 mm — AB (ref 0.6–1.1)
RV LATERAL S' VELOCITY: 12.8 cm/s
TAPSE: 23.6 mm
TDI e' lateral: 8.27
TDI e' medial: 5.87

## 2017-05-12 NOTE — Progress Notes (Signed)
*  PRELIMINARY RESULTS* Echocardiogram 2D Echocardiogram has been performed with Agitated Saline Bubble Study.  Samuel Germany 05/12/2017, 12:06 PM

## 2017-05-21 ENCOUNTER — Telehealth: Payer: Self-pay | Admitting: *Deleted

## 2017-05-21 NOTE — Telephone Encounter (Signed)
-----   Message from Herminio Commons, MD sent at 05/21/2017 11:22 AM EST ----- Normal pumping function. Possible very small PFO which is insignificant.

## 2017-05-21 NOTE — Telephone Encounter (Signed)
Called patient with test results. No answer. Left message to call back.  

## 2017-07-02 ENCOUNTER — Other Ambulatory Visit (HOSPITAL_COMMUNITY): Payer: Self-pay | Admitting: Internal Medicine

## 2017-07-02 DIAGNOSIS — Z09 Encounter for follow-up examination after completed treatment for conditions other than malignant neoplasm: Secondary | ICD-10-CM

## 2017-07-02 DIAGNOSIS — N641 Fat necrosis of breast: Secondary | ICD-10-CM

## 2017-07-15 ENCOUNTER — Ambulatory Visit (HOSPITAL_COMMUNITY)
Admission: RE | Admit: 2017-07-15 | Discharge: 2017-07-15 | Disposition: A | Discharge status: Home / Self Care | Payer: Medicare Other | Source: Ambulatory Visit | Attending: Internal Medicine | Admitting: Internal Medicine

## 2017-07-15 ENCOUNTER — Encounter (HOSPITAL_COMMUNITY): Payer: Self-pay

## 2017-07-15 DIAGNOSIS — N641 Fat necrosis of breast: Secondary | ICD-10-CM | POA: Diagnosis present

## 2017-07-15 DIAGNOSIS — Z09 Encounter for follow-up examination after completed treatment for conditions other than malignant neoplasm: Secondary | ICD-10-CM | POA: Diagnosis present

## 2017-08-26 ENCOUNTER — Ambulatory Visit: Payer: Self-pay | Admitting: Neurology

## 2017-09-16 ENCOUNTER — Ambulatory Visit: Payer: Self-pay | Admitting: Neurology

## 2017-12-17 ENCOUNTER — Encounter: Payer: Self-pay | Admitting: Neurology

## 2017-12-18 ENCOUNTER — Ambulatory Visit: Payer: Medicare Other | Admitting: Neurology

## 2017-12-18 ENCOUNTER — Encounter: Payer: Self-pay | Admitting: Neurology

## 2017-12-18 ENCOUNTER — Encounter

## 2017-12-18 VITALS — BP 122/67 | HR 74 | Ht 65.0 in | Wt 193.0 lb

## 2017-12-18 DIAGNOSIS — G4733 Obstructive sleep apnea (adult) (pediatric): Secondary | ICD-10-CM

## 2017-12-18 DIAGNOSIS — Z9989 Dependence on other enabling machines and devices: Secondary | ICD-10-CM | POA: Diagnosis not present

## 2017-12-18 NOTE — Progress Notes (Signed)
[facility] SLEEP MEDICINE CONSULTATION   Provider:  Larey Seat, M D  Referring Provider: Leanna Battles, MD Primary Care Physician:  Leanna Battles, MD  Chief Complaint  Patient presents with  . Follow-up    pt alone, rm 10. DME [facility]. pt states that machine is working well. no concerns.    HPI:    Revisit date is 18 December 2017, I have the pleasure of seeing Norma Montemurro. Dula today for a yearly revisit for evaluation of CPAP compliance in the treatment of obstructive sleep apnea.  Mrs. Mckenna Gamm is meanwhile 72 years old she continues to use her CPAP for his highest compliance, 100% for the last 30 days with an average use at time of 7 hours 51 minutes, CPAP is set at 8 cmH2O with 2 cm EPR and her residual AHI is 0.8/h.  She is using an air sense 10 AutoSet the air leaks were mild, and she has not been woken by snoring, gasping for air or feeling air hungry. She is followed by [facility].    She endorsed today the fatigue severity score 27 and the Epworth sleepiness score at 5 out of 24 points, with a geriatric depression score endorsed at 11 out of 15. 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 He is demented and has CHF.  The stress made her sick, and her fathers cardiologist had refered him to hospice for respite care.       4-5 -2018.  HAMPTON COST is a 72 y.o. female, who is seen here as a referral from Dr. Philip Aspen for an insomnia and snoring evaluation, ruling out apnea. Mrs. Jessie describes today a very troubling development. But she had no difficulties getting adjusted to CPAP use and for the first year was a very compliant user she then developed a tendency to mouth breathe- woke up with a parched dry "sticky " tongue, and now was placed in a full face mask with little improvement. Her sleep time has always been only 4-5 hours at night before CPAP was initiated but now her  sleep time is even less. She endorsed the fatigue severity score today at 32 points, the Epworth sleepiness score at 8 and the geriatric depression score at a very high rate at 10 out of 15 Points. She uses a chin strip.  Her compliance has been excellent in spite of the troubles that 100% 5 hours 19 minutes each night ( but was 6-7 hours before) , 8 cm water pressure is currently used was 27 m EPR the residual AHI is 1.3. Her humidifier was not adjusted by the durable medical equipment company, she is currently followed by [facility].She had no medication adjustments, and we were not able to identify other factors that could have led to a dry mouth. She does have dry eyes now- to she reports that she has an air leak into the eyes which irritates the eyes. Systane gel at night, drops in daytime. I am looking for sicca symptoms and for a more comfortable mask. I will again place her on a nasasl pillow. Currently on an [employer].     Consultation, CD The patient is a retired Pharmacist, hospital, and still has some engrained sleep habits, such as rising at 5 AM as she did for over 40 years. She is not sleepy but fatigued, she exclaimed. She had related the  changes in her sleep pattern to her menopause. She developed palpitations from caffeine in peri menopause, felt panicked and anxious. She used" to sleep like a baby", but at age 10 she found herself in completed  Menopause. She could finally relate hot flushes and mood changes, tearfulness. Her sleep never recovered. She gained weight and begun snoring. Her sleep has become less and less deep, restorative.  The patient reports she usually goes to bed around 9 PM watches TV in the bedroom. About one hour later she will go to sleep, but wakes up 2-3 times to go to the bathroom. If she turns over she will wake up, too. She has sometimes trouble to go back to sleep.  She always wakes up at 2.30 and she finds no further restful sleep after that, rises at  5.30, spontaneously, no alarm needed. She often has a dry mouth when waking up. She does not report any nocturnal headaches usually. The overall nocturnal sleep time is between 4-5 hours only, she takes  One hour nap in the afternoon, about 2-3 PM, and on her sofa. Her husband sleeps not in the same room, she snores and has woken herself up form snoring. Her dog sleeps nearby, but never wakes her. She has owned a dog before that needed physically to be brought outside to urinate , 7-8 times at night. This dog died about 2 years ago.  She doesn't take any caffeine anymore. The sleep pattern may have changed than.  Family history - one cousin with OSA , non compliant  CPAP . Reviewed medication and alLergies,  HISTORY    06-10-14 Mrs. Goh is here today for her first visit after sleep study and CPAP titration. The patient underwent a polysomnography on 12-17-13 which revealed a mild apnea with an AHI of 10.5 and an RDI of 14.6 per hour but associated with oxygen desaturations. In the setting of snoring and periodic limb movements a CPAP titration was not performed the same night as the patient did not have enough sleep time at line. She returned on 02-18-14 for the CPAP titration,  her AHI was reduced to 1.4,  periodic limb movements were  rare - and she did best at 8 cm water pressure under which she slept 492 minutes.    PLAN : [facility] to exchange the machine, ASAP. RV in 3 month with NP for follow up.  The patient still has some nasal congestion which may contribute to her difficulties breathing at night at least on occasion. I recommend a saltwater nasal spray to just literally flush the nostril. I have generated an electronic order to apnea to make sure that the patients machine will be exchanged for another one ASAP. We do not have to change the mask, Interface, Tubing or filter at this time.   Interval history from 08-15-14  Mrs.Hernandez has seen  benefit of a new CPAP machine. She had undergone an  overnight pulse oximetry by using CPAP. But had downloaded data spoke for a very well controlled sleep apnea with an residual AHI file below 5 her fatigue severity was very high and she was excessively daytime sleepy as well.  It turned out that she remained hypoxemic and that a new machine was needed to control her sleep apnea as well as her oxygen levels. Her durable medical equipment company, namely a [employer], exchanged her old not longer working machine for a new model which now is doing well. Her fatigue severity score today for further is 19 and  her Epworth sleepiness score is 4 points. I was also able to get a new date download. The patient had been diagnosed with sleep apnea seems to do well on the current setting of 8 cm water. She continues to use an Eason nasal mask in medium size. Heated humidification his knee is used. The AHI on 06-10-14 was 0.4 , at  a 97% compliance.The patient reports problems with [facility], being billed several hundred USD a month for a rental machine- and having not filed for insurance coverage ? A broken machine needs to be replaced and is an insurance covered expense.   2017 , RV Mrs. Rubel is now on her third CPAP machine and received her most recent in December 2016.  She is meanwhile 72 years of age she is using her CPAP with excellent compliance 100% of days and 100% of those days over 4 hours, with an average hourly use of 7 hours and 46 minutes and the set pressure of 8 cm water this 2 cm expiratory pressure relief.  Her residual AHI is 0.7, which speaks for an excellent resolution. The 95th percentile airleak is close to 8 L/m which is a minimal air leak.    Review of Systems: Out of a complete 14 system review, the patient complains of only the following symptoms, and all other reviewed systems are negative. She is the main caretaker of her 9 year old father who is a difficult person.    The Epworth sleepiness score on 08/15/2015 was endorsed at 4 points,  fatigue severity score at 17 points and  the geriatric depression scale at 7 points. A score between 5 and 10 is indicative or suggestive of depression.    Social History   Socioeconomic History  . Marital status: Married    Spouse name: Gwyndolyn Saxon  . Number of children: 0  . Years of education: Master's  . Highest education level: Not on file  Occupational History  . Not on file  Social Needs  . Financial resource strain: Not on file  . Food insecurity:    Worry: Not on file    Inability: Not on file  . Transportation needs:    Medical: Not on file    Non-medical: Not on file  Tobacco Use  . Smoking status: Former Smoker    Packs/day: 0.50    Years: 10.00    Pack years: 5.00    Types: Cigarettes    Start date: 12/11/1965    Last attempt to quit: 07/30/1975    Years since quitting: 42.4  . Smokeless tobacco: Never Used  Substance and Sexual Activity  . Alcohol use: No    Alcohol/week: 0.0 standard drinks  . Drug use: No  . Sexual activity: Not on file  Lifestyle  . Physical activity:    Days per week: Not on file    Minutes per session: Not on file  . Stress: Not on file  Relationships  . Social connections:    Talks on phone: Not on file    Gets together: Not on file    Attends religious service: Not on file    Active member of club or organization: Not on file    Attends meetings of clubs or organizations: Not on file    Relationship status: Not on file  . Intimate partner violence:    Fear of current or ex partner: Not on file    Emotionally abused: Not on file    Physically abused: Not on file  Forced sexual activity: Not on file  Other Topics Concern  . Not on file  Social History Narrative   Patient is married Gwyndolyn Saxon) and lives at home with her husband.   Patient is a retired Pharmacist, hospital.   Patient has a Master's degree.   Patient is right-handed.   Patient does not drink any caffeine.    Family History  Problem Relation Age of Onset  .  Hyperlipidemia Mother   . Hypertension Mother   . Heart disease Mother   . Macular degeneration Father   . Cancer Sister        colon  . Hypertension Sister   . Breast cancer Sister   . Hypertension Other   . Kidney disease Other   . Stroke Other   . Heart attack Other   . Cancer Other   . Obesity Other     Past Medical History:  Diagnosis Date  . Allergy   . Anxiety   . Atrial septal defect   . Depression   . Diabetes mellitus without complication ([place])   . Hip pain, right   . Hyperlipidemia   . Hypertension   . Neuromuscular disorder (HCC)    neuropathy  . Obesity   . Osteoarthritis of lumbar spine   . Osteoarthritis of thumb    bilateral  . Prediabetes   . Renal disorder    kidney stones  . Sleep apnea    uses CPAP    Past Surgical History:  Procedure Laterality Date  . BREAST REDUCTION SURGERY    . BREAST SURGERY  2008   Right breast  . BUNIONECTOMY  1993   Right foot  . CATARACT EXTRACTION    . CYSTOSCOPY W/ URETERAL STENT PLACEMENT Left 05/31/2015   Procedure: CYSTOSCOPY WITH STENT EXCHANGE;  Surgeon: Cleon Gustin, MD;  Location: [facility];  Service: Urology;  Laterality: Left;  . CYSTOSCOPY WITH RETROGRADE PYELOGRAM, URETEROSCOPY AND STENT PLACEMENT Left 05/19/2015   Procedure: CYSTOSCOPY WITH RETROGRADE PYELOGRAM, URETEROSCOPY AND STENT PLACEMENT;  Surgeon: Cleon Gustin, MD;  Location: [facility];  Service: Urology;  Laterality: Left;  . CYSTOSCOPY/RETROGRADE/URETEROSCOPY/STONE EXTRACTION WITH BASKET Left 05/31/2015   Procedure: CYSTOSCOPY/RETROGRADE/STONE EXTRACTION WITH BASKET;  Surgeon: Cleon Gustin, MD;  Location: [facility];  Service: Urology;  Laterality: Left;  . eyelid surgery Bilateral    Blephroplasty  . REDUCTION MAMMAPLASTY Bilateral 2003  . STONE EXTRACTION WITH BASKET Left 05/19/2015   Procedure: STONE EXTRACTION WITH BASKET;  Surgeon: Cleon Gustin, MD;  Location: [facility];  Service: Urology;  Laterality: Left;  . TONSILLECTOMY     . TOTAL HIP ARTHROPLASTY  08/2010   Right hip    Current Outpatient Medications  Medication Sig Dispense Refill  . antiseptic oral rinse (BIOTENE) LIQD 15 mLs by Mouth Rinse route as needed for dry mouth.    . metFORMIN (GLUCOPHAGE-XR) 500 MG 24 hr tablet Take 500 mg by mouth at bedtime.     . metroNIDAZOLE (METROGEL) 0.75 % gel Apply 1 application topically 2 (two) times daily.    . Misc. Devices MISC CPAP    . Polyethyl Glycol-Propyl Glycol (SYSTANE OP) Apply to eye.    . pravastatin (PRAVACHOL) 20 MG tablet Take 20 mg by mouth daily.    . TURMERIC PO Take 1 tablet by mouth daily.     Current Facility-Administered Medications  Medication Dose Route Frequency Provider Last Rate Last Dose  . 0.9 %  sodium chloride infusion  500 mL Intravenous Continuous Pyrtle, Lajuan Lines,  MD        Allergies as of 12/18/2017 - Review Complete 12/18/2017  Allergen Reaction Noted  . Aspirin Other (See Comments)   . Benzocaine  06/26/2011  . Caffeine  06/26/2011  . Codeine Swelling   . Lidocaine-epinephrine Other (See Comments) 11/04/2013  . Oxycodone-acetaminophen Swelling 11/04/2013  . Percocet [oxycodone-acetaminophen] Swelling 06/26/2011  . Procaine hcl  06/26/2011  . Pseudoephedrine hcl er Other (See Comments) 06/26/2011  . Simvastatin      Vitals: BP 122/67   Pulse 74   Ht 5\' 5"  (1.651 m)   Wt 193 lb (87.5 kg)   BMI 32.12 kg/m  Last Weight:  Wt Readings from Last 1 Encounters:  12/18/17 193 lb (87.5 kg)   Last Height:   Ht Readings from Last 1 Encounters:  12/18/17 5\' 5"  (1.651 m)    Physical exam:  General: The patient is awake, alert and appears not in acute distress. The patient is well groomed. Head: Normocephalic, atraumatic. Neck is supple. Mallampati 4, neck circumference:16 inches. Retrognathia.  Cardiovascular:  Regular rate and rhythm, Trunk: BMI is elevated and patient  has normal posture.  Neurologic exam : The patient is awake and alert, oriented to place and time.    Memory subjective described as intact.  Speech is fluent without  dysarthria, dysphonia or aphasia.  Mood and affect are appropriate.  Cranial nerves: Pupils are equal and briskly reactive to light.  Extraocular movements in vertical and horizontal planes intact and without nystagmus. Visual fields by finger perimetry are intact. Hearing to finger rub intact.  Facial sensation intact to fine touch. Facial motor strength is symmetric and tongue and uvula move midline.  Thumb joint arthritis on the right dominant hand. Wears a brace.   Assessment:  After physical and neurologic examination, review of laboratory studies, imaging, neurophysiology testing and pre-existing records, assessment is   Mrs. Edmunds is again a highly compliant CPAP user and had an easy time sleeping with CPAP while on nasal pillows.   Follow up in 12 month   Larey Seat, MD  Cc Leanna Battles, MD

## 2018-02-13 ENCOUNTER — Other Ambulatory Visit: Payer: Self-pay | Admitting: Urology

## 2018-02-13 NOTE — Progress Notes (Signed)
Spoke with pt whom verbalized understanding of arrival time to short stay for 02/14/2018 at 0730 and no food or drink after midnight.

## 2018-02-14 ENCOUNTER — Encounter (HOSPITAL_COMMUNITY): Payer: Self-pay | Admitting: *Deleted

## 2018-02-14 ENCOUNTER — Encounter (HOSPITAL_COMMUNITY)
Admission: RE | Disposition: A | Discharge status: Home / Self Care | Payer: Self-pay | Source: Ambulatory Visit | Attending: Urology

## 2018-02-14 ENCOUNTER — Ambulatory Visit (HOSPITAL_COMMUNITY): Payer: Medicare Other | Admitting: Certified Registered Nurse Anesthetist

## 2018-02-14 ENCOUNTER — Ambulatory Visit (HOSPITAL_COMMUNITY)
Admission: RE | Admit: 2018-02-14 | Discharge: 2018-02-14 | Disposition: A | Discharge status: Home / Self Care | Payer: Medicare Other | Source: Ambulatory Visit | Attending: Urology | Admitting: Urology

## 2018-02-14 ENCOUNTER — Ambulatory Visit (HOSPITAL_COMMUNITY): Payer: Medicare Other

## 2018-02-14 DIAGNOSIS — E119 Type 2 diabetes mellitus without complications: Secondary | ICD-10-CM | POA: Insufficient documentation

## 2018-02-14 DIAGNOSIS — F419 Anxiety disorder, unspecified: Secondary | ICD-10-CM | POA: Insufficient documentation

## 2018-02-14 DIAGNOSIS — I10 Essential (primary) hypertension: Secondary | ICD-10-CM | POA: Insufficient documentation

## 2018-02-14 DIAGNOSIS — Z7984 Long term (current) use of oral hypoglycemic drugs: Secondary | ICD-10-CM | POA: Diagnosis not present

## 2018-02-14 DIAGNOSIS — F329 Major depressive disorder, single episode, unspecified: Secondary | ICD-10-CM | POA: Diagnosis not present

## 2018-02-14 DIAGNOSIS — Z87891 Personal history of nicotine dependence: Secondary | ICD-10-CM | POA: Insufficient documentation

## 2018-02-14 DIAGNOSIS — Z87442 Personal history of urinary calculi: Secondary | ICD-10-CM | POA: Diagnosis not present

## 2018-02-14 DIAGNOSIS — E785 Hyperlipidemia, unspecified: Secondary | ICD-10-CM | POA: Diagnosis not present

## 2018-02-14 DIAGNOSIS — Z885 Allergy status to narcotic agent status: Secondary | ICD-10-CM | POA: Insufficient documentation

## 2018-02-14 DIAGNOSIS — G4730 Sleep apnea, unspecified: Secondary | ICD-10-CM | POA: Insufficient documentation

## 2018-02-14 DIAGNOSIS — Z79899 Other long term (current) drug therapy: Secondary | ICD-10-CM | POA: Diagnosis not present

## 2018-02-14 DIAGNOSIS — N201 Calculus of ureter: Secondary | ICD-10-CM | POA: Diagnosis not present

## 2018-02-14 HISTORY — PX: CYSTOSCOPY WITH RETROGRADE PYELOGRAM, URETEROSCOPY AND STENT PLACEMENT: SHX5789

## 2018-02-14 LAB — GLUCOSE, CAPILLARY
Glucose-Capillary: 107 mg/dL — ABNORMAL HIGH (ref 70–99)
Glucose-Capillary: 117 mg/dL — ABNORMAL HIGH (ref 70–99)

## 2018-02-14 SURGERY — CYSTOURETEROSCOPY, WITH RETROGRADE PYELOGRAM AND STENT INSERTION
Anesthesia: General | Site: Ureter | Laterality: Left

## 2018-02-14 MED ORDER — ONDANSETRON HCL 4 MG/2ML IJ SOLN
INTRAMUSCULAR | Status: AC
  Filled 2018-02-14: qty 2

## 2018-02-14 MED ORDER — FENTANYL CITRATE (PF) 100 MCG/2ML IJ SOLN
INTRAMUSCULAR | Status: AC
  Filled 2018-02-14: qty 2

## 2018-02-14 MED ORDER — IOHEXOL 300 MG/ML  SOLN
INTRAMUSCULAR | Status: DC | PRN
  Administered 2018-02-14: 12 mL

## 2018-02-14 MED ORDER — CEFAZOLIN SODIUM-DEXTROSE 2-4 GM/100ML-% IV SOLN
INTRAVENOUS | Status: AC
  Filled 2018-02-14: qty 100

## 2018-02-14 MED ORDER — PROPOFOL 10 MG/ML IV BOLUS
INTRAVENOUS | Status: AC
  Filled 2018-02-14: qty 20

## 2018-02-14 MED ORDER — LIDOCAINE 2% (20 MG/ML) 5 ML SYRINGE
INTRAMUSCULAR | Status: DC | PRN
  Administered 2018-02-14: 40 mg via INTRAVENOUS

## 2018-02-14 MED ORDER — EPHEDRINE 5 MG/ML INJ
INTRAVENOUS | Status: AC
  Filled 2018-02-14: qty 10

## 2018-02-14 MED ORDER — DEXAMETHASONE SODIUM PHOSPHATE 10 MG/ML IJ SOLN
INTRAMUSCULAR | Status: AC
  Filled 2018-02-14: qty 1

## 2018-02-14 MED ORDER — MIDAZOLAM HCL 2 MG/2ML IJ SOLN
1.0000 mg | INTRAMUSCULAR | Status: DC
  Administered 2018-02-14: 1 mg via INTRAVENOUS

## 2018-02-14 MED ORDER — DEXAMETHASONE SODIUM PHOSPHATE 4 MG/ML IJ SOLN
INTRAMUSCULAR | Status: DC | PRN
  Administered 2018-02-14: 10 mg via INTRAVENOUS

## 2018-02-14 MED ORDER — FENTANYL CITRATE (PF) 100 MCG/2ML IJ SOLN
100.0000 ug | Freq: Once | INTRAMUSCULAR | Status: AC
  Administered 2018-02-14: 100 ug via INTRAVENOUS

## 2018-02-14 MED ORDER — SUCCINYLCHOLINE CHLORIDE 200 MG/10ML IV SOSY
PREFILLED_SYRINGE | INTRAVENOUS | Status: DC | PRN
  Administered 2018-02-14: 100 mg via INTRAVENOUS

## 2018-02-14 MED ORDER — SODIUM CHLORIDE 0.9 % IR SOLN
Status: DC | PRN
  Administered 2018-02-14: 3000 mL

## 2018-02-14 MED ORDER — MIDAZOLAM HCL 2 MG/2ML IJ SOLN
INTRAMUSCULAR | Status: AC
  Administered 2018-02-14: 1 mg via INTRAVENOUS
  Filled 2018-02-14: qty 2

## 2018-02-14 MED ORDER — ONDANSETRON HCL 4 MG/2ML IJ SOLN
INTRAMUSCULAR | Status: DC | PRN
  Administered 2018-02-14: 4 mg via INTRAVENOUS

## 2018-02-14 MED ORDER — LACTATED RINGERS IV SOLN
INTRAVENOUS | Status: DC
  Administered 2018-02-14: 08:00:00 via INTRAVENOUS

## 2018-02-14 MED ORDER — KETOROLAC TROMETHAMINE 10 MG PO TABS: 10.0000 mg | ORAL_TABLET | Freq: Four times a day (QID) | ORAL | 0 refills | Status: DC | PRN

## 2018-02-14 MED ORDER — MIDAZOLAM HCL 2 MG/2ML IJ SOLN: 0.5000 mg | Freq: Once | INTRAMUSCULAR | Status: DC | PRN

## 2018-02-14 MED ORDER — FENTANYL CITRATE (PF) 100 MCG/2ML IJ SOLN
INTRAMUSCULAR | Status: AC
  Administered 2018-02-14: 100 ug via INTRAVENOUS
  Filled 2018-02-14: qty 2

## 2018-02-14 MED ORDER — MEPERIDINE HCL 50 MG/ML IJ SOLN: 6.2500 mg | INTRAMUSCULAR | Status: DC | PRN

## 2018-02-14 MED ORDER — LIDOCAINE HCL (CARDIAC) PF 100 MG/5ML IV SOSY
PREFILLED_SYRINGE | INTRAVENOUS | Status: AC
  Filled 2018-02-14: qty 5

## 2018-02-14 MED ORDER — PROMETHAZINE HCL 25 MG/ML IJ SOLN: 6.2500 mg | INTRAMUSCULAR | Status: DC | PRN

## 2018-02-14 MED ORDER — PROPOFOL 10 MG/ML IV BOLUS
INTRAVENOUS | Status: DC | PRN
  Administered 2018-02-14: 110 mg via INTRAVENOUS

## 2018-02-14 MED ORDER — CEFAZOLIN SODIUM-DEXTROSE 2-4 GM/100ML-% IV SOLN
2.0000 g | INTRAVENOUS | Status: AC
  Administered 2018-02-14: 2 g via INTRAVENOUS

## 2018-02-14 MED ORDER — SCOPOLAMINE 1 MG/3DAYS TD PT72
MEDICATED_PATCH | TRANSDERMAL | Status: AC
  Filled 2018-02-14: qty 1

## 2018-02-14 MED ORDER — FENTANYL CITRATE (PF) 100 MCG/2ML IJ SOLN
25.0000 ug | INTRAMUSCULAR | Status: DC | PRN
  Administered 2018-02-14 (×2): 50 ug via INTRAVENOUS

## 2018-02-14 MED ORDER — SCOPOLAMINE 1 MG/3DAYS TD PT72
MEDICATED_PATCH | TRANSDERMAL | Status: DC | PRN
  Administered 2018-02-14: 1 via TRANSDERMAL

## 2018-02-14 MED ORDER — EPHEDRINE SULFATE-NACL 50-0.9 MG/10ML-% IV SOSY
PREFILLED_SYRINGE | INTRAVENOUS | Status: DC | PRN
  Administered 2018-02-14: 10 mg via INTRAVENOUS
  Administered 2018-02-14: 5 mg via INTRAVENOUS

## 2018-02-14 MED ORDER — LACTATED RINGERS IV SOLN: INTRAVENOUS | Status: DC

## 2018-02-14 MED ORDER — TRAMADOL HCL 50 MG PO TABS: 50.0000 mg | ORAL_TABLET | Freq: Four times a day (QID) | ORAL | 0 refills | Status: AC | PRN

## 2018-02-14 SURGICAL SUPPLY — 24 items
BAG URO CATCHER STRL LF (MISCELLANEOUS) ×4 IMPLANT
BASKET LASER NITINOL 1.9FR (BASKET) IMPLANT
CATH INTERMIT  6FR 70CM (CATHETERS) ×4 IMPLANT
CLOTH BEACON ORANGE TIMEOUT ST (SAFETY) ×4 IMPLANT
COVER SURGICAL LIGHT HANDLE (MISCELLANEOUS) ×4 IMPLANT
EXTRACTOR STONE 1.7FRX115CM (UROLOGICAL SUPPLIES) IMPLANT
FIBER LASER FLEXIVA 1000 (UROLOGICAL SUPPLIES) IMPLANT
FIBER LASER FLEXIVA 365 (UROLOGICAL SUPPLIES) IMPLANT
FIBER LASER FLEXIVA 550 (UROLOGICAL SUPPLIES) IMPLANT
FIBER LASER TRAC TIP (UROLOGICAL SUPPLIES) IMPLANT
GLOVE BIOGEL M STRL SZ7.5 (GLOVE) ×4 IMPLANT
GOWN STRL REUS W/TWL LRG LVL3 (GOWN DISPOSABLE) ×8 IMPLANT
GUIDEWIRE ANG ZIPWIRE 038X150 (WIRE) ×4 IMPLANT
GUIDEWIRE STR DUAL SENSOR (WIRE) ×4 IMPLANT
IV NS 1000ML (IV SOLUTION) ×2
IV NS 1000ML BAXH (IV SOLUTION) ×2 IMPLANT
MANIFOLD NEPTUNE II (INSTRUMENTS) ×4 IMPLANT
PACK CYSTO (CUSTOM PROCEDURE TRAY) ×4 IMPLANT
SHEATH URETERAL 12FRX28CM (UROLOGICAL SUPPLIES) IMPLANT
SHEATH URETERAL 12FRX35CM (MISCELLANEOUS) IMPLANT
SYR CONTROL 10ML LL (SYRINGE) ×4 IMPLANT
TUBE FEEDING 8FR 16IN STR KANG (MISCELLANEOUS) ×4 IMPLANT
TUBING CONNECTING 10 (TUBING) ×3 IMPLANT
TUBING CONNECTING 10' (TUBING) ×1

## 2018-02-14 NOTE — Brief Op Note (Signed)
02/14/2018  9:13 AM  PATIENT:  Julie Owen  72 y.o. female  PRE-OPERATIVE DIAGNOSIS:  LEFT URETERAL CALCULUS  POST-OPERATIVE DIAGNOSIS:  left ureteral calculus  PROCEDURE:  Procedure(s): CYSTOSCOPY WITH RETROGRADE PYELOGRAM, diagnostic URETEROSCOPY (Left)  SURGEON:  Surgeon(s) and Role:    Alexis Frock, MD - Primary  PHYSICIAN ASSISTANT:   ASSISTANTS: none   ANESTHESIA:   general  EBL:  0 mL   BLOOD ADMINISTERED:none  DRAINS: none   LOCAL MEDICATIONS USED:  NONE  SPECIMEN:  Source of Specimen:  left ureteral stone  DISPOSITION OF SPECIMEN:  [facility] for compositional analysis  COUNTS:  NO YES  TOURNIQUET:  * No tourniquets in log *  DICTATION: .Other Dictation: Dictation Number [id number]  PLAN OF CARE: Discharge to home after PACU  PATIENT DISPOSITION:  PACU - hemodynamically stable.   Delay start of Pharmacological VTE agent (>24hrs) due to surgical blood loss or risk of bleeding: not applicable

## 2018-02-14 NOTE — Anesthesia Preprocedure Evaluation (Signed)
Anesthesia Evaluation  Patient identified by MRN, date of birth, ID band Patient awake    Reviewed: Allergy & Precautions, NPO status , Patient's Chart, lab work & pertinent test results  History of Anesthesia Complications Negative for: history of anesthetic complications  Airway Mallampati: II  TM Distance: >3 FB Neck ROM: Full    Dental  (+) Dental Advisory Given, Chipped   Pulmonary sleep apnea and Continuous Positive Airway Pressure Ventilation , former smoker (quit 1977),    breath sounds clear to auscultation       Cardiovascular hypertension, Pt. on medications (-) angina Rhythm:Regular Rate:Normal  12/18 ECHO: normal LVF with EF 60-65%, probable small PFO with possible very small L to R shunt (with negative bubble study)   Neuro/Psych Anxiety Depression    GI/Hepatic Neg liver ROS, Nauseated with present kidney stone   Endo/Other  diabetes (glu 107), Oral Hypoglycemic AgentsMorbid obesity  Renal/GU Renal disease (stones)     Musculoskeletal   Abdominal (+) + obese,   Peds  Hematology   Anesthesia Other Findings Multiple drug intolerances (headache, anxiety, nausea, etc), she does not relate any allergic symptoms with medications  Reproductive/Obstetrics                             Anesthesia Physical Anesthesia Plan  ASA: III  Anesthesia Plan: General   Post-op Pain Management:    Induction: Intravenous  PONV Risk Score and Plan: 3 and Ondansetron, Dexamethasone and Scopolamine patch - Pre-op  Airway Management Planned: Oral ETT  Additional Equipment:   Intra-op Plan:   Post-operative Plan:   Informed Consent: I have reviewed the patients History and Physical, chart, labs and discussed the procedure including the risks, benefits and alternatives for the proposed anesthesia with the patient or authorized representative who has indicated his/her understanding and  acceptance.   Dental advisory given  Plan Discussed with: CRNA and Surgeon  Anesthesia Plan Comments: (Plan routine monitors, GETA)        Anesthesia Quick Evaluation

## 2018-02-14 NOTE — H&P (Signed)
Julie Owen is an 72 y.o. female.    Chief Complaint: Pre-op LEFT Ureteroscopic Stone Manipulation  HPI:   1 - Left Ureteral Stones - fusiform left mid (9m) and distal (46mm) stones with mod hydro cy CT 01/2018 on eval left flank pain. No renal stones. Given trial of medical therpay but colic still quite bad at 2 weeks.   Today "Julie-Anne" is seen to proceed with LEFT ureteroscopic stone manipulation for multifocal left ureteral stones. No interval fevers.    Past Medical History:  Diagnosis Date  . Allergy   . Anxiety   . Atrial septal defect   . Depression   . Diabetes mellitus without complication ([place])   . Hip pain, right   . Hyperlipidemia   . Hypertension   . Neuromuscular disorder (HCC)    neuropathy  . Obesity   . Osteoarthritis of lumbar spine   . Osteoarthritis of thumb    bilateral  . Prediabetes   . Renal disorder    kidney stones  . Sleep apnea    uses CPAP    Past Surgical History:  Procedure Laterality Date  . BREAST REDUCTION SURGERY    . BREAST SURGERY  2008   Right breast  . BUNIONECTOMY  1993   Right foot  . CATARACT EXTRACTION    . CYSTOSCOPY W/ URETERAL STENT PLACEMENT Left 05/31/2015   Procedure: CYSTOSCOPY WITH STENT EXCHANGE;  Surgeon: Cleon Gustin, MD;  Location: [facility];  Service: Urology;  Laterality: Left;  . CYSTOSCOPY WITH RETROGRADE PYELOGRAM, URETEROSCOPY AND STENT PLACEMENT Left 05/19/2015   Procedure: CYSTOSCOPY WITH RETROGRADE PYELOGRAM, URETEROSCOPY AND STENT PLACEMENT;  Surgeon: Cleon Gustin, MD;  Location: [facility];  Service: Urology;  Laterality: Left;  . CYSTOSCOPY/RETROGRADE/URETEROSCOPY/STONE EXTRACTION WITH BASKET Left 05/31/2015   Procedure: CYSTOSCOPY/RETROGRADE/STONE EXTRACTION WITH BASKET;  Surgeon: Cleon Gustin, MD;  Location: [facility];  Service: Urology;  Laterality: Left;  . eyelid surgery Bilateral    Blephroplasty  . REDUCTION MAMMAPLASTY Bilateral 2003  . STONE EXTRACTION WITH BASKET Left 05/19/2015   Procedure: STONE EXTRACTION WITH BASKET;  Surgeon: Cleon Gustin, MD;  Location: [facility];  Service: Urology;  Laterality: Left;  . TONSILLECTOMY    . TOTAL HIP ARTHROPLASTY  08/2010   Right hip    Family History  Problem Relation Age of Onset  . Hyperlipidemia Mother   . Hypertension Mother   . Heart disease Mother   . Macular degeneration Father   . Cancer Sister        colon  . Hypertension Sister   . Breast cancer Sister   . Hypertension Other   . Kidney disease Other   . Stroke Other   . Heart attack Other   . Cancer Other   . Obesity Other    Social History:  reports that she quit smoking about 42 years ago. Her smoking use included cigarettes. She started smoking about 52 years ago. She has a 5.00 pack-year smoking history. She has never used smokeless tobacco. She reports that she does not drink alcohol or use drugs.  Allergies:  Allergies  Allergen Reactions  . Aspirin Other (See Comments)    Abdominal pain  . Benzocaine   . Caffeine     Panic attacks  . Codeine Swelling  . Lidocaine-Epinephrine Other (See Comments)  . Oxycodone-Acetaminophen Swelling  . Percocet [Oxycodone-Acetaminophen] Swelling  . Procaine Hcl   . Pseudoephedrine Hcl Er Other (See Comments)    Jittery, nervousness  . Simvastatin  REACTION: hair loss    Facility-Administered Medications Prior to Admission  Medication Dose Route Frequency Provider Last Rate Last Dose  . 0.9 %  sodium chloride infusion  500 mL Intravenous Continuous Pyrtle, Lajuan Lines, MD       Medications Prior to Admission  Medication Sig Dispense Refill  . antiseptic oral rinse (BIOTENE) LIQD 15 mLs by Mouth Rinse route as needed for dry mouth.    . metFORMIN (GLUCOPHAGE-XR) 500 MG 24 hr tablet Take 500 mg by mouth at bedtime.     . metroNIDAZOLE (METROGEL) 0.75 % gel Apply 1 application topically 2 (two) times daily.    . Misc. Devices MISC CPAP    . Polyethyl Glycol-Propyl Glycol (SYSTANE OP) Apply to eye.    .  pravastatin (PRAVACHOL) 20 MG tablet Take 20 mg by mouth daily.    . TURMERIC PO Take 1 tablet by mouth daily.      No results found for this or any previous visit (from the past 48 hour(s)). No results found.  Review of Systems  Constitutional: Negative.  Negative for chills and fever.  HENT: Negative.   Eyes: Negative.   Respiratory: Negative.   Cardiovascular: Negative.   Gastrointestinal: Negative.   Genitourinary: Positive for flank pain.  Skin: Negative.   Neurological: Negative.   Endo/Heme/Allergies: Negative.   Psychiatric/Behavioral: Negative.     There were no vitals taken for this visit. Physical Exam  Constitutional: She appears well-developed.  HENT:  Head: Normocephalic.  Neck: Normal range of motion.  Cardiovascular: Normal rate.  Respiratory: Effort normal.  GI:  Mild truncal obesity  Genitourinary:  Genitourinary Comments: Moderate LEFT CVAT  Musculoskeletal: Normal range of motion.  Neurological: She is alert.  Skin: Skin is warm.  Psychiatric: She has a normal mood and affect.     Assessment/Plan  Proceed as planned with LEFT ureteroscopy with goal of left side stone free. Risks, benefits, expected peri-op course discussed previously and reiterated today.   Alexis Frock, MD 02/14/2018, 7:19 AM

## 2018-02-14 NOTE — Anesthesia Procedure Notes (Signed)
Procedure Name: Intubation Date/Time: 02/14/2018 8:48 AM Performed by: Claudia Desanctis, CRNA Pre-anesthesia Checklist: Patient identified, Emergency Drugs available, Suction available and Patient being monitored Patient Re-evaluated:Patient Re-evaluated prior to induction Oxygen Delivery Method: Circle system utilized Preoxygenation: Pre-oxygenation with 100% oxygen Induction Type: IV induction, Cricoid Pressure applied and Rapid sequence Ventilation: Mask ventilation without difficulty Laryngoscope Size: 2 and Miller Grade View: Grade I Tube type: Oral Tube size: 7.0 mm Number of attempts: 1 Airway Equipment and Method: Stylet Placement Confirmation: ETT inserted through vocal cords under direct vision,  positive ETCO2 and breath sounds checked- equal and bilateral Secured at: 21 cm Tube secured with: Tape Dental Injury: Teeth and Oropharynx as per pre-operative assessment

## 2018-02-14 NOTE — Op Note (Signed)
[name], Julie Owen MEDICAL RECORD [id number] ACCOUNT [id number] DATE OF BIRTH:04/18/1946 FACILITY: [facility] LOCATION: [facility] PHYSICIAN:Arael Piccione, MD  OPERATIVE REPORT  DATE OF PROCEDURE:  02/14/2018  PREOPERATIVE DIAGNOSIS:  Multifocal left ureteral stone.  POSTOPERATIVE DIAGNOSIS:  Multifocal left ureteral stone, status post interval passage.  PROCEDURE: 1.  Cystoscopy with left retrograde pyelogram interpretation. 2.  Left diagnostic ureteroscopy.  INDICATIONS:  The patient is a pleasant 72 year old lady with recent history of multifocal left ureteral stones.  Her colic has been quite significant.  She has been on medical therapy.  She was seen in the office yesterday and noted to have significant  colic still.  Options were discussed for further management including continued medical therapy versus shockwave lithotripsy versus ureteroscopy, and she wished to proceed with the latter with goal of stone free.  Informed consent was obtained and placed  in the medical record.  DESCRIPTION OF PROCEDURE:  The patient being identified, the procedure verified as being left ureteroscopic stimulation was confirmed.  Procedure timeout was performed.  Antibiotics were administered.  General LMA anesthesia was induced.  The patient was  placed into a low lithotomy position.  A sterile field was created by prepping and draping the patient's vagina, introitus and proximal thighs using iodine.  Cystourethroscopy was performed using a 20-French rigid cystoscope with offset lens.   Inspection of the bladder revealed a free-floating stone likely consistent with interval passage of one of her ureteral stones.  Given the multifocality and the goal to verify stone free, decision was made to proceed with left ureteroscopy.  The left  ureteral orifice was cannulated with a 6-French end hole  catheter, and left retrograde pyelogram was obtained.  Left ventriculogram demonstrated a single left ureter,  single-system left kidney.  No obvious filling defects, narrowing or hydronephrosis noted.  An 0.038 ZIPwire was advanced to the lower pole and set aside as a safety wire.  An 8-French feeding tube  was placed in the urinary bladder for pressure release, and semirigid ureteroscopy was performed the entire length of the left ureter alongside a separate sensor working wire.  No mucosal abnormalities or stone was seen whatsoever.  Given the relatively  small nature of the patient's more proximal known stone, decision was made to proceed with flexible ureteroscopy to verify stone-free status within the kidney.  As such, the semirigid scope was exchanged for the flexible digital ureteroscope over the  sensor working wire, and flexible digital ureteroscopy was performed of the left kidney, including all calices x3.  No evidence of intraluminal calcifications was seen whatsoever within all accessible calices.  The ureteroscope was then withdrawn under  continuous vision.  No mucosal abnormalities were found.  It was not felt that stenting would be needed.  The safety wire was removed.  The stone fragment was irrigated out from the bladder and set aside for composition analysis, and the procedure was  terminated.  The patient tolerated the procedure well.  No immediate complications.  The patient did postanesthesia care in stable condition.  LN/NUANCE  D:02/14/2018 T:02/14/2018 JOB:[id number]

## 2018-02-14 NOTE — Discharge Instructions (Signed)
1 - You may have urinary urgency (bladder spasms) and bloody urine on / off with stent in place. This is normal. ° °2 - Call MD or go to ER for fever >102, severe pain / nausea / vomiting not relieved by medications, or acute change in medical status ° °

## 2018-02-14 NOTE — Transfer of Care (Signed)
Immediate Anesthesia Transfer of Care Note  Patient: Julie Owen  Procedure(s) Performed: CYSTOSCOPY WITH RETROGRADE PYELOGRAM, diagnostic URETEROSCOPY (Left Ureter)  Patient Location: PACU  Anesthesia Type:General  Level of Consciousness: awake, alert , oriented and patient cooperative  Airway & Oxygen Therapy: Patient Spontanous Breathing and Patient connected to nasal cannula oxygen  Post-op Assessment: Report given to RN and Post -op Vital signs reviewed and stable  Post vital signs: Reviewed and stable  Last Vitals:  Vitals Value Taken Time  BP    Temp    Pulse 84 02/14/2018  9:22 AM  Resp 13 02/14/2018  9:22 AM  SpO2 100 % 02/14/2018  9:22 AM  Vitals shown include unvalidated device data.  Last Pain:  Vitals:   02/14/18 0740  TempSrc: Oral  PainSc: 8          Complications: No apparent anesthesia complications

## 2018-02-14 NOTE — Anesthesia Postprocedure Evaluation (Signed)
Anesthesia Post Note  Patient: ALEX MCMANIGAL  Procedure(s) Performed: CYSTOSCOPY WITH RETROGRADE PYELOGRAM, diagnostic URETEROSCOPY (Left Ureter)     Patient location during evaluation: PACU Anesthesia Type: General Level of consciousness: awake and alert, oriented and patient cooperative Pain management: pain level controlled Vital Signs Assessment: post-procedure vital signs reviewed and stable Respiratory status: spontaneous breathing, nonlabored ventilation and respiratory function stable Cardiovascular status: blood pressure returned to baseline and stable Postop Assessment: no apparent nausea or vomiting Anesthetic complications: no    Last Vitals:  Vitals:   02/14/18 0945 02/14/18 1000  BP: (!) 134/57 138/64  Pulse: 73 67  Resp: 19 17  Temp:  37.1 C  SpO2: 98% 95%    Last Pain:  Vitals:   02/14/18 1000  TempSrc:   PainSc: 0-No pain                 Jamier Urbas,E. Erastus Bartolomei

## 2018-02-15 ENCOUNTER — Encounter (HOSPITAL_COMMUNITY): Payer: Self-pay | Admitting: Urology

## 2018-04-05 IMAGING — MG 2D DIGITAL SCREENING BILATERAL MAMMOGRAM WITH CAD AND ADJUNCT TO
6 of 9 series · 6 of 25 positions shown · non-contrast
Comparison: Previous exam(s).

CLINICAL DATA: Screening.

EXAM:
2D DIGITAL SCREENING BILATERAL MAMMOGRAM WITH CAD AND ADJUNCT TOMO

[R CC (1 of 2)]
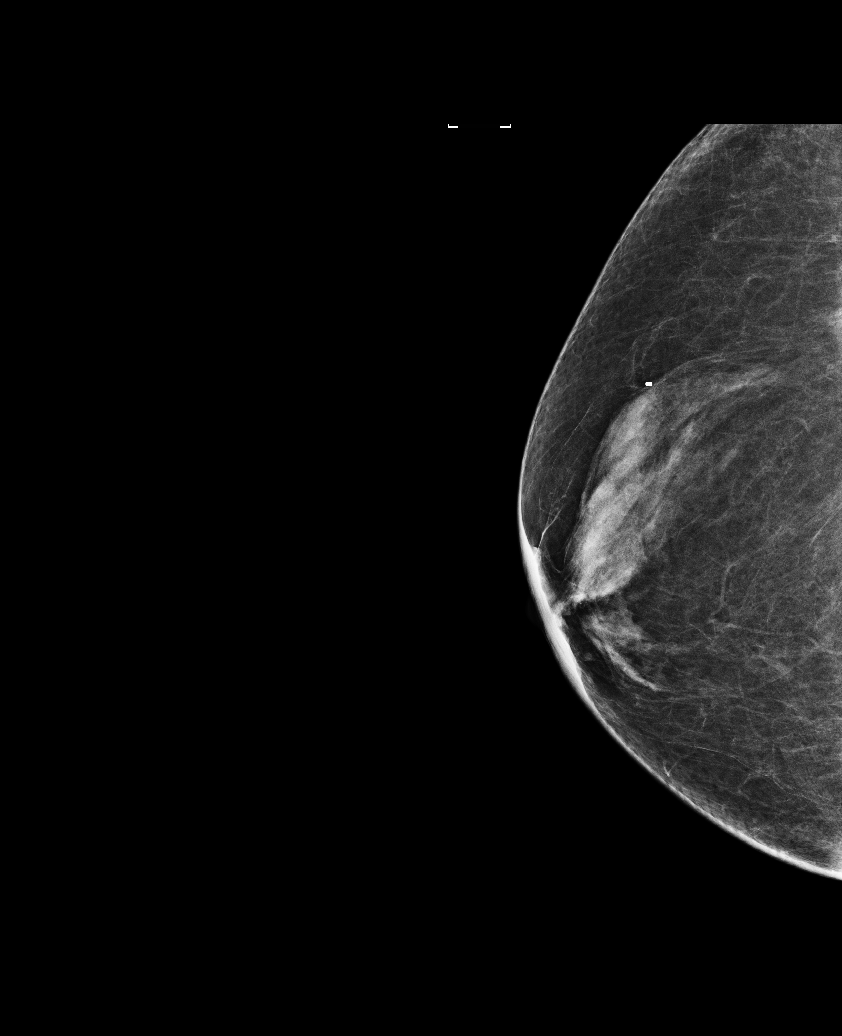

[L CC]
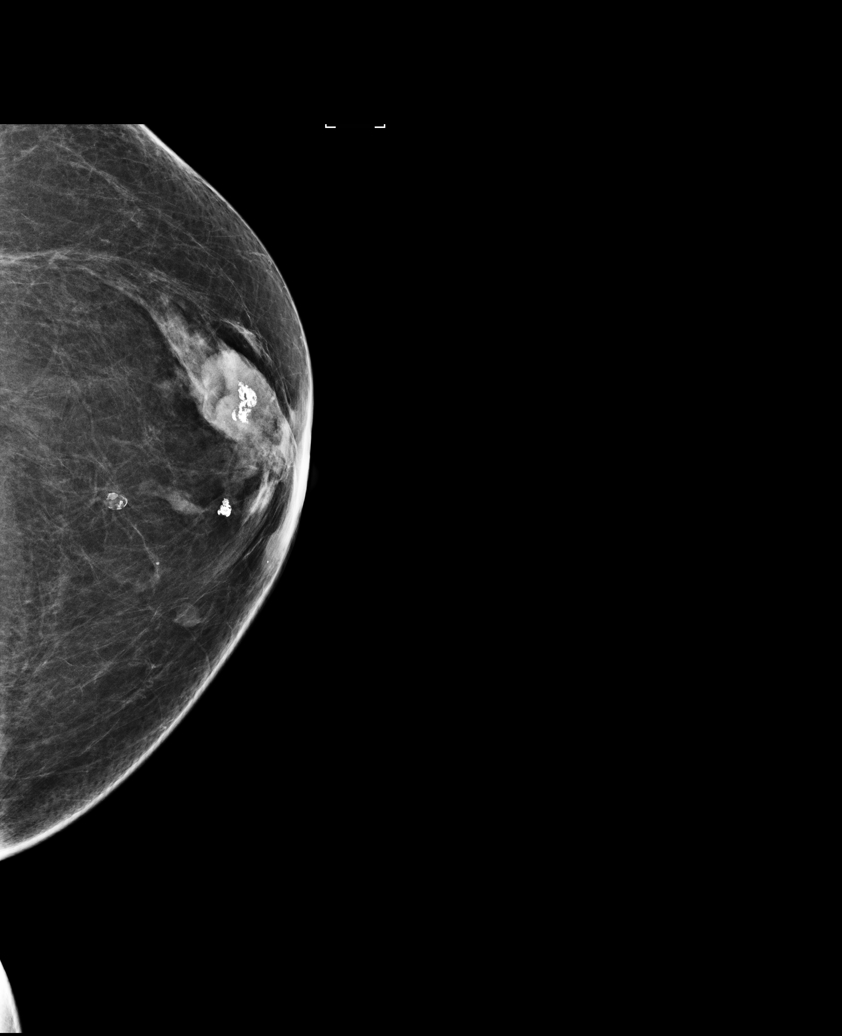

[R CC (2 of 2)]
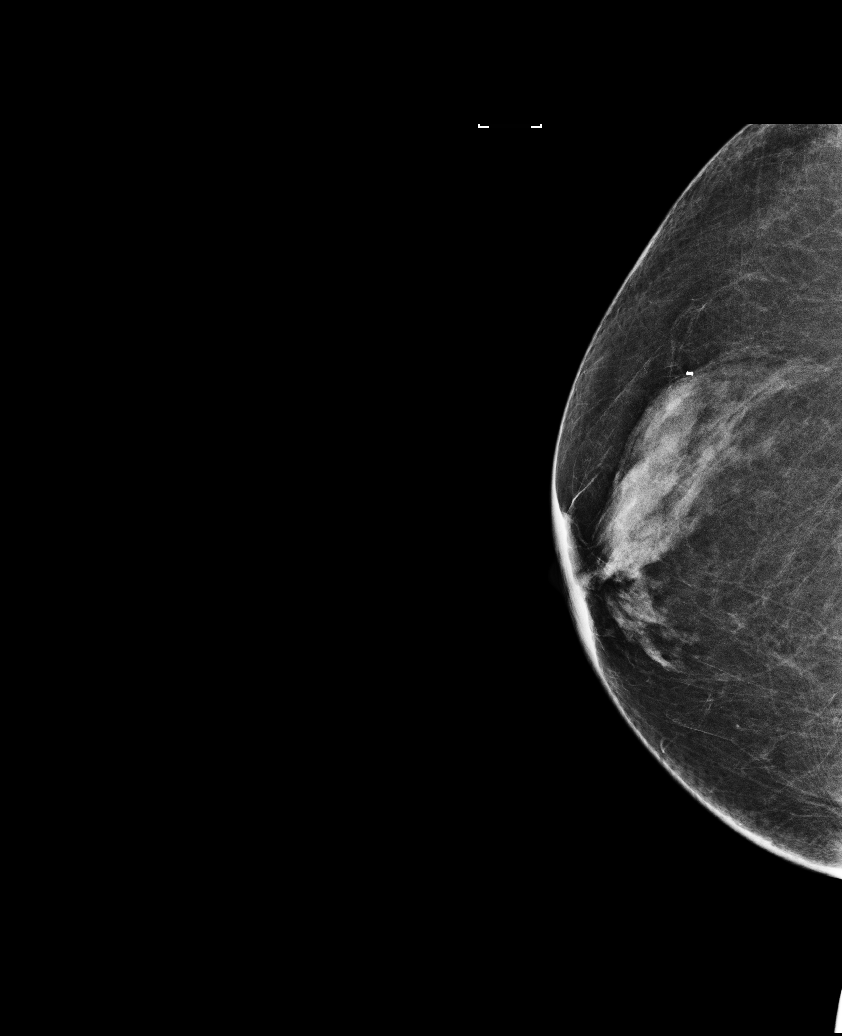

[R MLO]
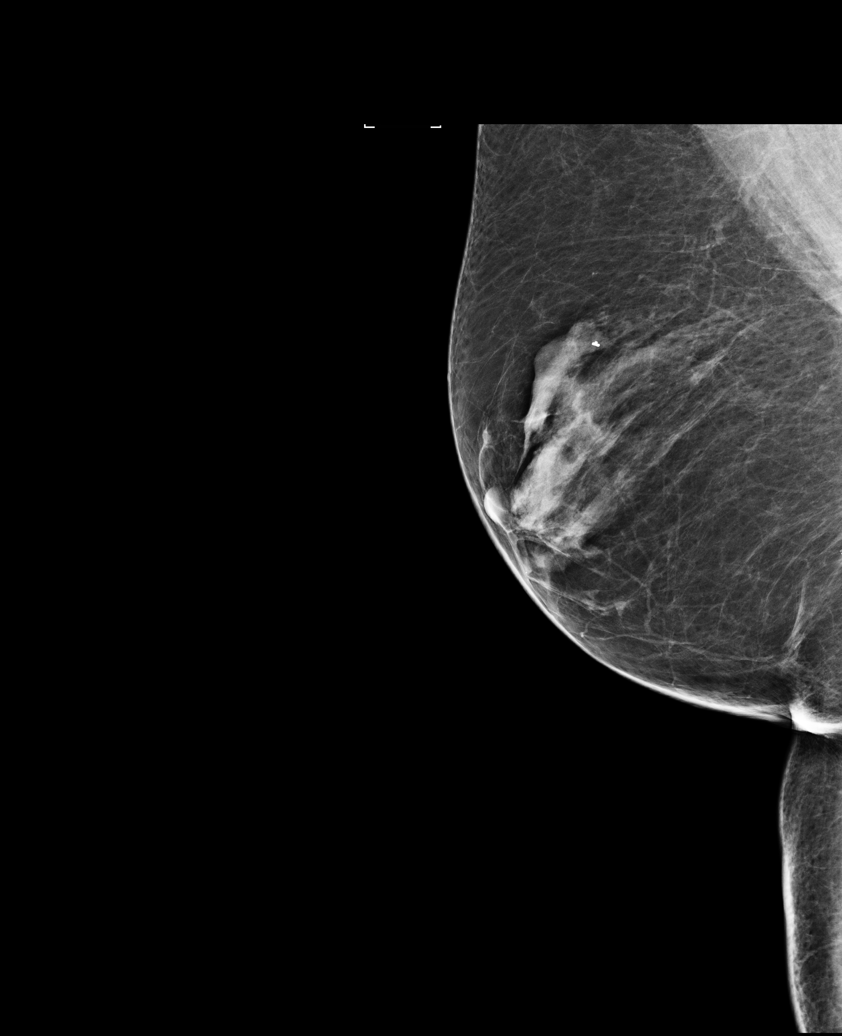

[L MLO]
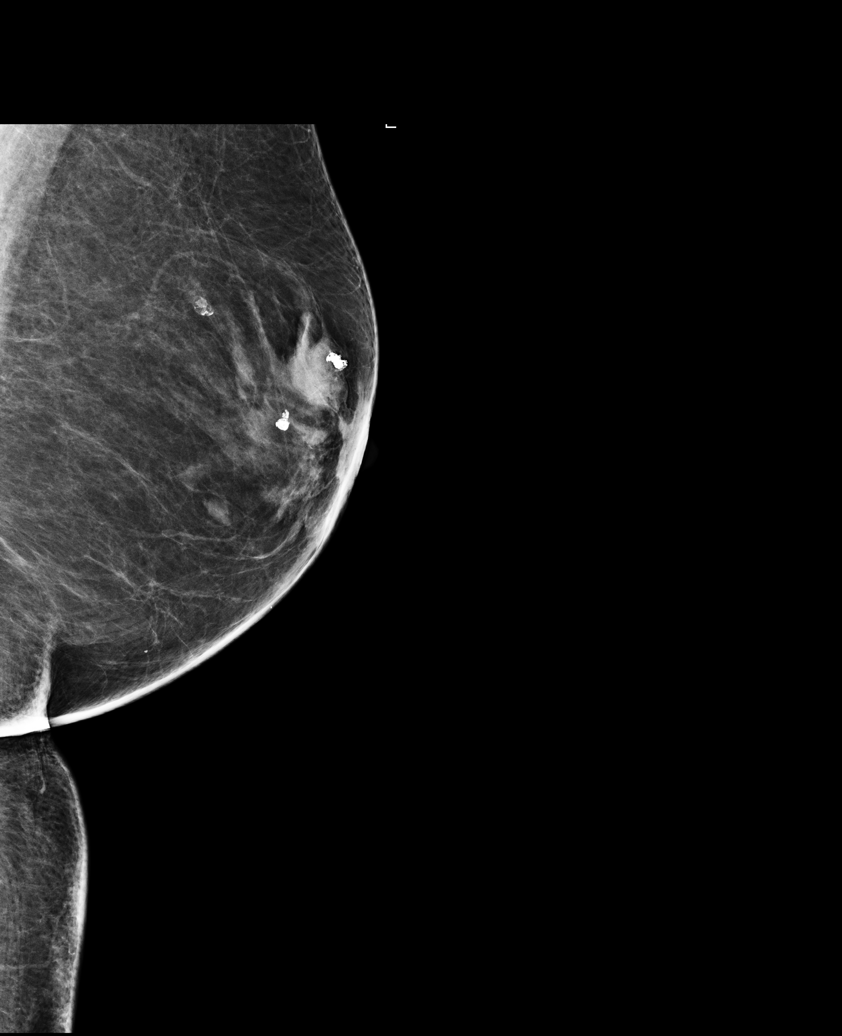

[L MLO tomo · tomo slice 33/65.0]
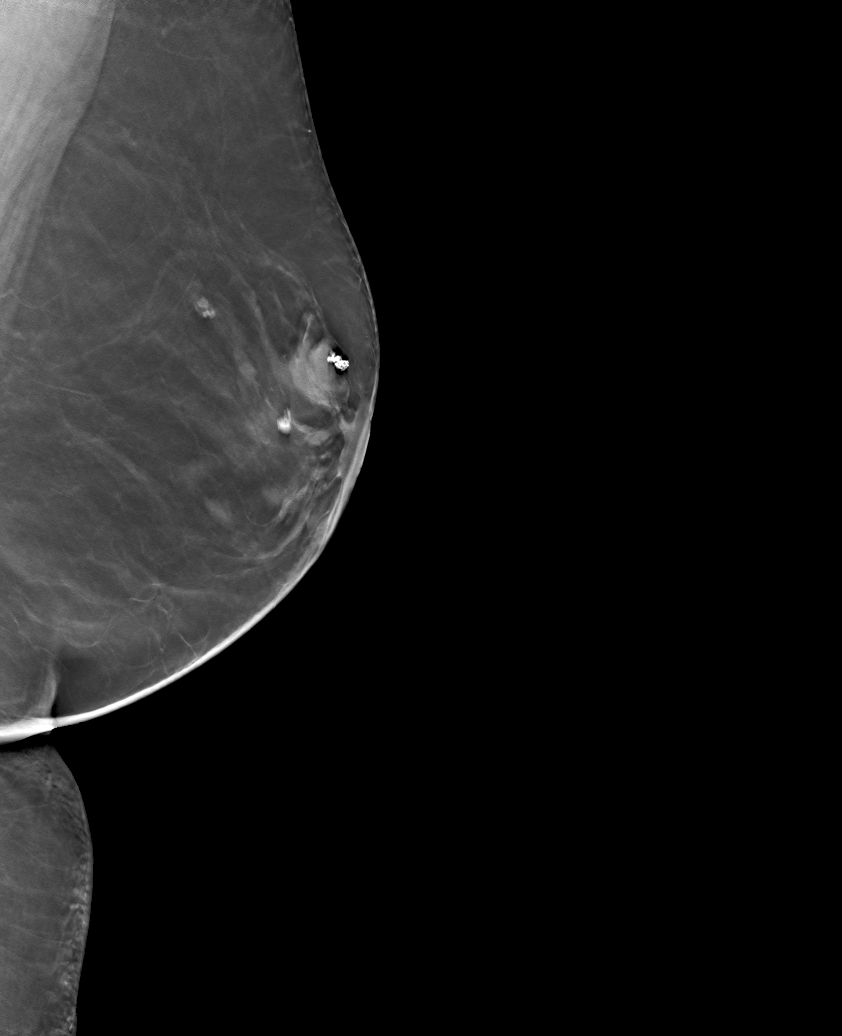

[6 of 25 positions shown; findings below may reference images not displayed]

ACR Breast Density Category b: There are scattered areas of
fibroglandular density.
FINDINGS: In the left breast, a possible mass warrants further evaluation. In
the right breast, no findings suspicious for malignancy.

Images were processed with CAD.
IMPRESSION: Further evaluation is suggested for possible mass in the left
breast.

RECOMMENDATION:
Diagnostic mammogram and possibly ultrasound of the left breast.
(Code:[id number])

The patient will be contacted regarding the findings, and additional
imaging will be scheduled.

BI-RADS CATEGORY  0: Incomplete. Need additional imaging evaluation
and/or prior mammograms for comparison.

## 2018-05-14 ENCOUNTER — Encounter: Payer: Self-pay | Admitting: Cardiovascular Disease

## 2018-05-14 ENCOUNTER — Ambulatory Visit: Payer: Medicare Other | Admitting: Cardiovascular Disease

## 2018-05-14 ENCOUNTER — Encounter

## 2018-05-14 VITALS — BP 136/67 | HR 63 | Ht 65.0 in | Wt 192.8 lb

## 2018-05-14 DIAGNOSIS — I10 Essential (primary) hypertension: Secondary | ICD-10-CM

## 2018-05-14 DIAGNOSIS — Q2112 Patent foramen ovale: Secondary | ICD-10-CM

## 2018-05-14 DIAGNOSIS — E7800 Pure hypercholesterolemia, unspecified: Secondary | ICD-10-CM

## 2018-05-14 DIAGNOSIS — Q211 Atrial septal defect, unspecified: Secondary | ICD-10-CM

## 2018-05-14 NOTE — Progress Notes (Signed)
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 There were plans for percutaneous closure by a Dr. Dwain Sarna in June/July 2006. She refused to undergo a cardiac MRI due to fears of dye allergy. She also has hypertension, hyperlipidemia, mild sleep apnea, and carotid artery disease.  She underwent a transthoracic echocardiogram with bubble study on 05/12/2017.  This demonstrated normal left ventricular systolic function and regional wall motion, LVEF 60 to 65%, and grade 1 diastolic dysfunction.  Color Doppler suggested a very small left-to-right shunt although the bubble study appeared to be negative.  Color Doppler suggested a probable PFO or small ASD, and the location was difficult to determine based on visualized portions of the septum.  That being said, the shunt appeared to be very small and normal right-sided chamber sizes would suggest it is nonsignificant physiologically.  The patient denies any symptoms of chest pain, palpitations, shortness of breath, lightheadedness, dizziness, leg swelling, orthopnea, PND, and syncope.  Had some issues with right hand arthritis and kidney stones earlier in the year but both have since resolved.  She expressed her gratefulness regarding my referral of her father to hospice.    Review of Systems: As per "subjective", otherwise negative.  Allergies  Allergen Reactions  . Aspirin Other (See Comments)    Abdominal pain  . Benzocaine   . Caffeine     Panic attacks  . Codeine Swelling  . Lidocaine-Epinephrine Other (See Comments)  . Oxycodone-Acetaminophen Swelling  . Percocet [Oxycodone-Acetaminophen] Swelling  . Pravastatin     myalgia  . Procaine Hcl   . Pseudoephedrine Hcl Er Other (See Comments)    Jittery, nervousness  . Simvastatin     REACTION: hair loss    Current Outpatient Medications  Medication Sig Dispense Refill  . antiseptic oral rinse (BIOTENE) LIQD 15  mLs by Mouth Rinse route as needed for dry mouth.    . losartan (COZAAR) 50 MG tablet Take 50 mg by mouth daily.    . metFORMIN (GLUCOPHAGE-XR) 500 MG 24 hr tablet Take 500 mg by mouth at bedtime.     . metroNIDAZOLE (METROGEL) 0.75 % gel Apply 1 application topically 2 (two) times daily.    . Misc. Devices MISC CPAP    . Polyethyl Glycol-Propyl Glycol (SYSTANE OP) Apply to eye.    . TURMERIC PO Take 1 tablet by mouth daily.     Current Facility-Administered Medications  Medication Dose Route Frequency Provider Last Rate Last Dose  . 0.9 %  sodium chloride infusion  500 mL Intravenous Continuous Pyrtle, Lajuan Lines, MD        Past Medical History:  Diagnosis Date  . Allergy   . Anxiety   . Atrial septal defect   . Depression   . Diabetes mellitus without complication ([place])   . Hip pain, right   . Hyperlipidemia   . Hypertension   . Neuromuscular disorder (HCC)    neuropathy  . Obesity   . Osteoarthritis of lumbar spine   . Osteoarthritis of thumb    bilateral  . Prediabetes   . Renal disorder    kidney stones  . Sleep apnea    uses CPAP    Past Surgical History:  Procedure Laterality Date  . BREAST REDUCTION SURGERY    . BREAST SURGERY  2008   Right breast  . BUNIONECTOMY  1993   Right foot  . CATARACT EXTRACTION    .  CYSTOSCOPY W/ URETERAL STENT PLACEMENT Left 05/31/2015   Procedure: CYSTOSCOPY WITH STENT EXCHANGE;  Surgeon: Cleon Gustin, MD;  Location: [facility];  Service: Urology;  Laterality: Left;  . CYSTOSCOPY WITH RETROGRADE PYELOGRAM, URETEROSCOPY AND STENT PLACEMENT Left 05/19/2015   Procedure: CYSTOSCOPY WITH RETROGRADE PYELOGRAM, URETEROSCOPY AND STENT PLACEMENT;  Surgeon: Cleon Gustin, MD;  Location: [facility];  Service: Urology;  Laterality: Left;  . CYSTOSCOPY WITH RETROGRADE PYELOGRAM, URETEROSCOPY AND STENT PLACEMENT Left 02/14/2018   Procedure: CYSTOSCOPY WITH RETROGRADE PYELOGRAM, diagnostic URETEROSCOPY;  Surgeon: Alexis Frock, MD;  Location: [facility];   Service: Urology;  Laterality: Left;  . CYSTOSCOPY/RETROGRADE/URETEROSCOPY/STONE EXTRACTION WITH BASKET Left 05/31/2015   Procedure: CYSTOSCOPY/RETROGRADE/STONE EXTRACTION WITH BASKET;  Surgeon: Cleon Gustin, MD;  Location: [facility];  Service: Urology;  Laterality: Left;  . eyelid surgery Bilateral    Blephroplasty  . REDUCTION MAMMAPLASTY Bilateral 2003  . STONE EXTRACTION WITH BASKET Left 05/19/2015   Procedure: STONE EXTRACTION WITH BASKET;  Surgeon: Cleon Gustin, MD;  Location: [facility];  Service: Urology;  Laterality: Left;  . TONSILLECTOMY    . TOTAL HIP ARTHROPLASTY  08/2010   Right hip    Social History   Socioeconomic History  . Marital status: Married    Spouse name: Gwyndolyn Saxon  . Number of children: 0  . Years of education: Master's  . Highest education level: Not on file  Occupational History  . Not on file  Social Needs  . Financial resource strain: Not on file  . Food insecurity:    Worry: Not on file    Inability: Not on file  . Transportation needs:    Medical: Not on file    Non-medical: Not on file  Tobacco Use  . Smoking status: Former Smoker    Packs/day: 0.50    Years: 10.00    Pack years: 5.00    Types: Cigarettes    Start date: 12/11/1965    Last attempt to quit: 07/30/1975    Years since quitting: 42.8  . Smokeless tobacco: Never Used  Substance and Sexual Activity  . Alcohol use: No    Alcohol/week: 0.0 standard drinks  . Drug use: No  . Sexual activity: Not on file  Lifestyle  . Physical activity:    Days per week: Not on file    Minutes per session: Not on file  . Stress: Not on file  Relationships  . Social connections:    Talks on phone: Not on file    Gets together: Not on file    Attends religious service: Not on file    Active member of club or organization: Not on file    Attends meetings of clubs or organizations: Not on file    Relationship status: Not on file  . Intimate partner violence:    Fear of current or ex partner:  Not on file    Emotionally abused: Not on file    Physically abused: Not on file    Forced sexual activity: Not on file  Other Topics Concern  . Not on file  Social History Narrative   Patient is married Gwyndolyn Saxon) and lives at home with her husband.   Patient is a retired Pharmacist, hospital.   Patient has a Master's degree.   Patient is right-handed.   Patient does not drink any caffeine.     Vitals:   05/14/18 1120  BP: 136/67  Pulse: 63  SpO2: 98%  Weight: 192 lb 12.8 oz (87.5 kg)  Height: 5\' 5"  (  1.651 m)    Wt Readings from Last 3 Encounters:  05/14/18 192 lb 12.8 oz (87.5 kg)  02/14/18 182 lb 9.6 oz (82.8 kg)  12/18/17 193 lb (87.5 kg)     PHYSICAL EXAM General: NAD HEENT: Normal. Neck: No JVD, no thyromegaly. Lungs: Clear to auscultation bilaterally with normal respiratory effort. CV: Regular rate and rhythm, normal S1/S2, no K1/I3, soft 1/6 systolic murmur heard throughout precordium. No pretibial or periankle edema.  No carotid bruit.   Abdomen: Soft, nontender, no distention.  Neurologic: Alert and oriented.  Psych: Normal affect. Skin: Normal. Musculoskeletal: No gross deformities.    ECG: Reviewed above under Subjective   Labs: Lab Results  Component Value Date/Time   K 3.9 05/18/2015 08:01 AM   BUN 21 (H) 05/18/2015 08:01 AM   CREATININE 1.18 (H) 05/18/2015 08:01 AM   ALT 43 05/11/2015 11:32 PM   HGB 13.2 05/18/2015 08:01 AM     Lipids: Lab Results  Component Value Date/Time   LDLDIRECT 206.0 01/10/2012 11:24 AM   CHOL 285 (H) 01/10/2012 11:24 AM   TRIG 198.0 (H) 01/10/2012 11:24 AM   HDL 54.60 01/10/2012 11:24 AM       ASSESSMENT AND PLAN:  1. Atrial septal defect/low secundum ASD: Symptomatically stable.  Most recent echocardiogram with bubble study from December 2018 reviewed above demonstrating a possible small and physiologically insignificant PFO versus small ASD.  Right-sided chamber sizes were normal.  2. Essential [id number] on  present therapy.  No changes.  3. Hyperlipidemia: Statins led to myalgias.  4. Bilateral carotid artery disease: Documented as mild. I do not appreciate any bruits. I would consider Dopplers in the future.  5. Sleep apnea: On CPAP.   Disposition: Follow up as needed   Kate Sable, M.D., F.A.C.C.

## 2018-05-14 NOTE — Patient Instructions (Signed)
Medication Instructions:  Continue all current medications.  Labwork: none  Testing/Procedures: none  Follow-Up: As needed.    Any Other Special Instructions Will Be Listed Below (If Applicable).  If you need a refill on your cardiac medications before your next appointment, please call your pharmacy.  

## 2018-06-08 ENCOUNTER — Other Ambulatory Visit (HOSPITAL_COMMUNITY): Payer: Self-pay | Admitting: Internal Medicine

## 2018-06-08 DIAGNOSIS — Z1231 Encounter for screening mammogram for malignant neoplasm of breast: Secondary | ICD-10-CM

## 2018-08-07 ENCOUNTER — Ambulatory Visit (HOSPITAL_COMMUNITY): Payer: Medicare Other

## 2018-09-23 ENCOUNTER — Ambulatory Visit (HOSPITAL_COMMUNITY)
Admission: RE | Admit: 2018-09-23 | Discharge: 2018-09-23 | Disposition: A | Discharge status: Home / Self Care | Payer: Medicare Other | Source: Ambulatory Visit | Attending: Internal Medicine | Admitting: Internal Medicine

## 2018-09-23 ENCOUNTER — Other Ambulatory Visit: Payer: Self-pay

## 2018-09-23 DIAGNOSIS — Z1231 Encounter for screening mammogram for malignant neoplasm of breast: Secondary | ICD-10-CM | POA: Insufficient documentation

## 2018-12-21 ENCOUNTER — Ambulatory Visit (INDEPENDENT_AMBULATORY_CARE_PROVIDER_SITE_OTHER): Payer: Medicare Other | Admitting: Family Medicine

## 2018-12-21 ENCOUNTER — Other Ambulatory Visit: Payer: Self-pay

## 2018-12-21 ENCOUNTER — Ambulatory Visit: Payer: Self-pay | Admitting: Neurology

## 2018-12-21 ENCOUNTER — Encounter: Payer: Self-pay | Admitting: Family Medicine

## 2018-12-21 VITALS — BP 129/70 | HR 62 | Temp 98.2°F | Ht 65.0 in | Wt 190.6 lb

## 2018-12-21 DIAGNOSIS — G4733 Obstructive sleep apnea (adult) (pediatric): Secondary | ICD-10-CM

## 2018-12-21 DIAGNOSIS — Z9989 Dependence on other enabling machines and devices: Secondary | ICD-10-CM | POA: Diagnosis not present

## 2018-12-21 NOTE — Progress Notes (Signed)
PATIENT: Julie Owen DOB: 1945-08-07  REASON FOR VISIT: follow up HISTORY FROM: patient  Chief Complaint  Patient presents with  . Follow-up    Yearly cpap f/u. Alone. Rm 8.  No new concerns at this time.      HISTORY OF PRESENT ILLNESS: Today 12/21/18 Julie Owen is a 73 y.o. female here today for follow up of OSA on CPAP.  She is doing very well with CPAP at home.  She is using her machine nightly.  She does note that she is resting better and has more energy during the day.  She began CPAP therapy in October 2015.  She is eligible for a new machine in October of this year.  She returns for evaluation.  Compliance report dated 11/17/2018 through 12/16/2018 reveals that she is using CPAP every night for compliance of 100%.  She is using CPAP greater than 4 hours every night.  Average usage is 9 hours and 10 minutes.  AHI was 0.9 on 8 cm of water and an EPR of 2.  There was no significant leak.  HISTORY: (copied from Dr Dohmeier's note on 12/18/2017)  Revisit date is 18 December 2017, I have the pleasure of seeing Julie Owen today for a yearly revisit for evaluation of CPAP compliance in the treatment of obstructive sleep apnea.  Julie Owen is meanwhile 73 years old she continues to use her CPAP for his highest compliance, 100% for the last 30 days with an average use at time of 7 hours 51 minutes, CPAP is set at 8 cmH2O with 2 cm EPR and her residual AHI is 0.8/h.  She is using an air sense 10 AutoSet the air leaks were mild, and she has not been woken by snoring, gasping for air or feeling air hungry. She is followed by [facility].    She endorsed today the fatigue severity score 27 and the Epworth sleepiness score at 5 out of 24 points, with a geriatric depression score endorsed at 11 out of 15. 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 He is demented and has CHF.  The stress made her sick, and her  fathers cardiologist had refered him to hospice for respite care.  4-5 -2018. Julie Owen is a 73 y.o. female, who is seen here as a referral from Dr. Philip Aspen for an insomnia and snoring evaluation, ruling out apnea. Julie Owen today a very troubling development. But she had no difficulties getting adjusted to CPAP use and for the first year was a very compliant user she then developed a tendency to mouth breathe- woke up with a parched dry "sticky " tongue, and now was placed in a full face mask with little improvement. Her sleep time has always been only 4-5 hours at night before CPAP was initiated but now her sleep time is even less. She endorsed the fatigue severity score today at 32 points, the Epworth sleepiness score at 8 and the geriatric depression score at a very high rate at 10 out of 15 Points. She uses a chin strip.  Her compliance has been excellent in spite of the troubles that 100% 5 hours 19 minutes each night ( but was 6-7 hours before) , 8 cm water pressure is currently used was 27 m EPR the residual AHI is 1.3. Her humidifier was not adjusted by the durable medical equipment  company, she is currently followed by [facility].She had no medication adjustments, and we were not able to identify other factors that could have led to a dry mouth. She does have dry eyes now- to she reports that she has an air leak into the eyes which irritates the eyes. Systane gel at night, drops in daytime. I am looking for sicca symptoms and for a more comfortable mask. I will again place her on a nasasl pillow. Currently on an [employer].   Consultation, CD The patient is a retired Pharmacist, hospital, and still has some engrained sleep habits, such as rising at 5 AM as she did for over 40 years. She is not sleepy but fatigued, she exclaimed. She had related the changes in her sleep pattern to her menopause. She developed palpitations from caffeine in peri menopause, felt panicked and  anxious. She used" to sleep like a baby", but at age 29 she found herself in completed  Menopause. She could finally relate hot flushes and mood changes, tearfulness. Her sleep never recovered. She gained weight and begun snoring. Her sleep has become less and less deep, restorative.  The patient reports she usually goes to bed around 9 PM watches TV in the bedroom. About one hour later she will go to sleep, but wakes up 2-3 times to go to the bathroom. If she turns over she will wake up, too. She has sometimes trouble to go back to sleep.  She always wakes up at 2.30 and she finds no further restful sleep after that, rises at 5.30, spontaneously, no alarm needed. She often has a dry mouth when waking up. She does not report any nocturnal headaches usually. The overall nocturnal sleep time is between 4-5 hours only, she takes  One hour nap in the afternoon, about 2-3 PM, and on her sofa. Her husband sleeps not in the same room, she snores and has woken herself up form snoring. Her dog sleeps nearby, but never wakes her. She has owned a dog before that needed physically to be brought outside to urinate , 7-8 times at night. This dog died about 2 years ago.  She doesn't take any caffeine anymore. The sleep pattern may have changed than.  Family history - one cousin with OSA , non compliant  CPAP . Reviewed medication and alLergies,  HISTORY   06-10-14 Julie Owen is here today for her first visit after sleep study and CPAP titration. The patient underwent a polysomnography on 12-17-13 which revealed a mild apnea with an AHI of 10.5 and an RDI of 14.6 per hour but associated with oxygen desaturations. In the setting of snoring and periodic limb movements a CPAP titration was not performed the same night as the patient did not have enough sleep time at line. She returned on 02-18-14 for the CPAP titration,  her AHI was reduced to 1.4,  periodic limb movements were  rare - and she did best at 8 cm water pressure  under which she slept 492 minutes.   PLAN : [facility] to exchange the machine, ASAP. RV in 3 month with NP for follow up.  The patient still has some nasal congestion which may contribute to her difficulties breathing at night at least on occasion. I recommend a saltwater nasal spray to just literally flush the nostril. I have generated an electronic order to apnea to make sure that the patients machine will be exchanged for another one ASAP. We do not have to change the mask, Interface,  Tubing or filter at this time.  Interval history from 08-15-14  Julie Owen has seen  benefit of a new CPAP machine. She had undergone an overnight pulse oximetry by using CPAP. But had downloaded data spoke for a very well controlled sleep apnea with an residual AHI file below 5 her fatigue severity was very high and she was excessively daytime sleepy as well.  It turned out that she remained hypoxemic and that a new machine was needed to control her sleep apnea as well as her oxygen levels. Her durable medical equipment company, namely a [employer], exchanged her old not longer working machine for a new model which now is doing well. Her fatigue severity score today for further is 19 and her Epworth sleepiness score is 4 points. I was also able to get a new date download. The patient had been diagnosed with sleep apnea seems to do well on the current setting of 8 cm water. She continues to use an Eason nasal mask in medium size. Heated humidification his knee is used. The AHI on 06-10-14 was 0.4 , at  a 97% compliance.The patient reports problems with [facility], being billed several hundred USD a month for a rental machine- and having not filed for insurance coverage ? A broken machine needs to be replaced and is an insurance covered expense.   2017 , RV Julie Owen is now on her third CPAP machine and received her most recent in December 2016.  She is meanwhile 73 years of age she is using her CPAP with excellent compliance 100% of  days and 100% of those days over 4 hours, with an average hourly use of 7 hours and 46 minutes and the set pressure of 8 cm water this 2 cm expiratory pressure relief.  Her residual AHI is 0.7, which speaks for an excellent resolution. The 95th percentile airleak is close to 8 L/m which is a minimal air leak.   REVIEW OF SYSTEMS: Out of a complete 14 system review of symptoms, the patient complains only of the following symptoms, facial swelling, restless legs, heat intolerance, environmental allergies, incontinence of bladder, joint pain, achy muscles, and all other reviewed systems are negative.  Epworth sleepiness scale: 5 Fatigue severity scale: 30   ALLERGIES: Allergies  Allergen Reactions  . Aspirin Other (See Comments)    Abdominal pain  . Benzocaine   . Caffeine     Panic attacks  . Codeine Swelling  . Lidocaine-Epinephrine Other (See Comments)  . Oxycodone-Acetaminophen Swelling  . Percocet [Oxycodone-Acetaminophen] Swelling  . Pravastatin     myalgia  . Procaine Hcl   . Pseudoephedrine Hcl Er Other (See Comments)    Jittery, nervousness  . Simvastatin     REACTION: hair loss    HOME MEDICATIONS: Outpatient Medications Prior to Visit  Medication Sig Dispense Refill  . antiseptic oral rinse (BIOTENE) LIQD 15 mLs by Mouth Rinse route as needed for dry mouth.    . losartan (COZAAR) 50 MG tablet Take 50 mg by mouth daily.    . metFORMIN (GLUCOPHAGE-XR) 500 MG 24 hr tablet Take 500 mg by mouth at bedtime.     . metroNIDAZOLE (METROGEL) 0.75 % gel Apply 1 application topically 2 (two) times daily.    . Misc. Devices MISC CPAP    . Polyethyl Glycol-Propyl Glycol (SYSTANE OP) Apply to eye.    . TURMERIC PO Take 1 tablet by mouth daily.     Facility-Administered Medications Prior to Visit  Medication Dose Route Frequency Provider  Last Rate Last Dose  . 0.9 %  sodium chloride infusion  500 mL Intravenous Continuous Pyrtle, Lajuan Lines, MD        PAST MEDICAL HISTORY: Past  Medical History:  Diagnosis Date  . Allergy   . Anxiety   . Atrial septal defect   . Depression   . Diabetes mellitus without complication ([place])   . Hip pain, right   . Hyperlipidemia   . Hypertension   . Neuromuscular disorder (H[facility])    neuropathy  . Obesity   . Osteoarthritis of lumbar spine   . Osteoarthritis of thumb    bilateral  . Prediabetes   . Renal disorder    kidney stones  . Sleep apnea    uses CPAP    PAST SURGICAL HISTORY: Past Surgical History:  Procedure Laterality Date  . BREAST REDUCTION SURGERY    . BREAST SURGERY  2008   Right breast  . BUNIONECTOMY  1993   Right foot  . CATARACT EXTRACTION    . CYSTOSCOPY W/ URETERAL STENT PLACEMENT Left 05/31/2015   Procedure: CYSTOSCOPY WITH STENT EXCHANGE;  Surgeon: Cleon Gustin, MD;  Location: [facility];  Service: Urology;  Laterality: Left;  . CYSTOSCOPY WITH RETROGRADE PYELOGRAM, URETEROSCOPY AND STENT PLACEMENT Left 05/19/2015   Procedure: CYSTOSCOPY WITH RETROGRADE PYELOGRAM, URETEROSCOPY AND STENT PLACEMENT;  Surgeon: Cleon Gustin, MD;  Location: [facility];  Service: Urology;  Laterality: Left;  . CYSTOSCOPY WITH RETROGRADE PYELOGRAM, URETEROSCOPY AND STENT PLACEMENT Left 02/14/2018   Procedure: CYSTOSCOPY WITH RETROGRADE PYELOGRAM, diagnostic URETEROSCOPY;  Surgeon: Alexis Frock, MD;  Location: [facility];  Service: Urology;  Laterality: Left;  . CYSTOSCOPY/RETROGRADE/URETEROSCOPY/STONE EXTRACTION WITH BASKET Left 05/31/2015   Procedure: CYSTOSCOPY/RETROGRADE/STONE EXTRACTION WITH BASKET;  Surgeon: Cleon Gustin, MD;  Location: [facility];  Service: Urology;  Laterality: Left;  . eyelid surgery Bilateral    Blephroplasty  . REDUCTION MAMMAPLASTY Bilateral 2003  . STONE EXTRACTION WITH BASKET Left 05/19/2015   Procedure: STONE EXTRACTION WITH BASKET;  Surgeon: Cleon Gustin, MD;  Location: [facility];  Service: Urology;  Laterality: Left;  . TONSILLECTOMY    . TOTAL HIP ARTHROPLASTY  08/2010   Right hip     FAMILY HISTORY: Family History  Problem Relation Age of Onset  . Hyperlipidemia Mother   . Hypertension Mother   . Heart disease Mother   . Macular degeneration Father   . Cancer Sister        colon  . Hypertension Sister   . Breast cancer Sister   . Hypertension Other   . Kidney disease Other   . Stroke Other   . Heart attack Other   . Cancer Other   . Obesity Other     SOCIAL HISTORY: Social History   Socioeconomic History  . Marital status: Married    Spouse name: Gwyndolyn Saxon  . Number of children: 0  . Years of education: Master's  . Highest education level: Not on file  Occupational History  . Not on file  Social Needs  . Financial resource strain: Not on file  . Food insecurity    Worry: Not on file    Inability: Not on file  . Transportation needs    Medical: Not on file    Non-medical: Not on file  Tobacco Use  . Smoking status: Former Smoker    Packs/day: 0.50    Years: 10.00    Pack years: 5.00    Types: Cigarettes    Start date: 12/11/1965  Quit date: 07/30/1975    Years since quitting: 43.4  . Smokeless tobacco: Never Used  Substance and Sexual Activity  . Alcohol use: No    Alcohol/week: 0.0 standard drinks  . Drug use: No  . Sexual activity: Not on file  Lifestyle  . Physical activity    Days per week: Not on file    Minutes per session: Not on file  . Stress: Not on file  Relationships  . Social Herbalist on phone: Not on file    Gets together: Not on file    Attends religious service: Not on file    Active member of club or organization: Not on file    Attends meetings of clubs or organizations: Not on file    Relationship status: Not on file  . Intimate partner violence    Fear of current or ex partner: Not on file    Emotionally abused: Not on file    Physically abused: Not on file    Forced sexual activity: Not on file  Other Topics Concern  . Not on file  Social History Narrative   Patient is married Gwyndolyn Saxon) and  lives at home with her husband.   Patient is a retired Pharmacist, hospital.   Patient has a Master's degree.   Patient is right-handed.   Patient does not drink any caffeine.      PHYSICAL EXAM  Vitals:   12/21/18 1121  BP: 129/70  Pulse: 62  Temp: 98.2 F (36.8 C)  TempSrc: Oral  Weight: 190 lb 9.6 oz (86.5 kg)  Height: 5\' 5"  (1.651 m)   Body mass index is 31.72 kg/m.  Generalized: Well developed, in no acute distress  Cardiology: normal rate and rhythm, no murmur noted Respiratory: Clear to auscultation bilaterally Mallampati 3+, neck circ 16.5" Neurological examination  Mentation: Alert oriented to time, place, history taking. Follows all commands speech and language fluent Cranial nerve II-XII: Pupils were equal round reactive to light. Extraocular movements were full, visual field were full on confrontational test. Facial sensation and strength were normal. Uvula tongue midline. Head turning and shoulder shrug  were normal and symmetric. Motor: The motor testing reveals 5 over 5 strength of all 4 extremities. Good symmetric motor tone is noted throughout.  Coordination: Cerebellar testing reveals good finger-nose-finger and heel-to-shin bilaterally.  Gait and station: Gait is normal.    DIAGNOSTIC DATA (LABS, IMAGING, TESTING) - I reviewed patient records, labs, notes, testing and imaging myself where available.  No flowsheet data found.   Lab Results  Component Value Date   WBC 9.4 05/18/2015   HGB 13.2 05/18/2015   HCT 40.8 05/18/2015   MCV 92.5 05/18/2015   PLT 255 05/18/2015      Component Value Date/Time   NA 139 05/18/2015 0801   K 3.9 05/18/2015 0801   CL 100 (L) 05/18/2015 0801   CO2 28 05/18/2015 0801   GLUCOSE 130 (H) 05/18/2015 0801   BUN 21 (H) 05/18/2015 0801   CREATININE 1.18 (H) 05/18/2015 0801   CALCIUM 9.3 05/18/2015 0801   PROT 6.9 05/11/2015 2332   ALBUMIN 4.1 05/11/2015 2332   AST 42 (H) 05/11/2015 2332   ALT 43 05/11/2015 2332   ALKPHOS 52  05/11/2015 2332   BILITOT 0.8 05/11/2015 2332   GFRNONAA 46 (L) 05/18/2015 0801   GFRAA 53 (L) 05/18/2015 0801   Lab Results  Component Value Date   CHOL 285 (H) 01/10/2012   HDL 54.60 01/10/2012   LDLDIRECT  206.0 01/10/2012   TRIG 198.0 (H) 01/10/2012   CHOLHDL 5 01/10/2012   Lab Results  Component Value Date   HGBA1C 5.8 01/10/2012   No results found for: VITAMINB12 No results found for: TSH   ASSESSMENT AND PLAN 73 y.o. year old female  has a past medical history of Allergy, Anxiety, Atrial septal defect, Depression, Diabetes mellitus without complication ([place]), Hip pain, right, Hyperlipidemia, Hypertension, Neuromuscular disorder ([place]), Obesity, Osteoarthritis of lumbar spine, Osteoarthritis of thumb, Prediabetes, Renal disorder, and Sleep apnea. here with     ICD-10-CM   1. OSA on CPAP  G47.33 For home use only DME continuous positive airway pressure (CPAP)   Z99.89     He is doing very well on CPAP therapy.  She continues excellent compliance.  She was encouraged to continue using CPAP nightly and for greater than 4 hours each night.  We will send an order to the DME company for new CPAP therapy as she is eligible in October.  If new machine is presented we will follow-up in 30 to 60 days, otherwise we will follow-up in 1 year.  She verbalizes understanding and agreement with this plan.   Orders Placed This Encounter  Procedures  . For home use only DME continuous positive airway pressure (CPAP)    New CPAP machine with current setting, eligible in 02/2019, will need f/u with [facility] in 31-60 days post initiation.    Order Specific Question:   Length of Need    Answer:   Lifetime    Order Specific Question:   Patient has OSA or probable OSA    Answer:   Yes    Order Specific Question:   Is the patient currently using CPAP in the home    Answer:   Yes    Order Specific Question:   Settings    Answer:   Other see comments    Order Specific Question:   CPAP supplies needed     Answer:   Mask, headgear, cushions, filters, heated tubing and water chamber     No orders of the defined types were placed in this encounter.     I spent 15 minutes with the patient. 50% of this time was spent counseling and educating patient on plan of care and medications.    Debbora Presto, FNP-C 12/21/2018, 12:28 PM [facility] [address] [place], [place] [address] [phone]

## 2018-12-21 NOTE — Patient Instructions (Signed)
Continue CPAP nightly and for greater than 4 hours each night   Follow up in 1 year    Sleep Apnea Sleep apnea affects breathing during sleep. It causes breathing to stop for a short time or to become shallow. It can also increase the risk of:  Heart attack.  Stroke.  Being very overweight (obese).  Diabetes.  Heart failure.  Irregular heartbeat. The goal of treatment is to help you breathe normally again. What are the causes? There are three kinds of sleep apnea:  Obstructive sleep apnea. This is caused by a blocked or collapsed airway.  Central sleep apnea. This happens when the brain does not send the right signals to the muscles that control breathing.  Mixed sleep apnea. This is a combination of obstructive and central sleep apnea. The most common cause of this condition is a collapsed or blocked airway. This can happen if:  Your throat muscles are too relaxed.  Your tongue and tonsils are too large.  You are overweight.  Your airway is too small. What increases the risk?  Being overweight.  Smoking.  Having a small airway.  Being older.  Being female.  Drinking alcohol.  Taking medicines to calm yourself (sedatives or tranquilizers).  Having family members with the condition. What are the signs or symptoms?  Trouble staying asleep.  Being sleepy or tired during the day.  Getting angry a lot.  Loud snoring.  Headaches in the morning.  Not being able to focus your mind (concentrate).  Forgetting things.  Less interest in sex.  Mood swings.  Personality changes.  Feelings of sadness (depression).  Waking up a lot during the night to pee (urinate).  Dry mouth.  Sore throat. How is this diagnosed?  Your medical history.  A physical exam.  A test that is done when you are sleeping (sleep study). The test is most often done in a sleep lab but may also be done at home. How is this treated?   Sleeping on your side.  Using a  medicine to get rid of mucus in your nose (decongestant).  Avoiding the use of alcohol, medicines to help you relax, or certain pain medicines (narcotics).  Losing weight, if needed.  Changing your diet.  Not smoking.  Using a machine to open your airway while you sleep, such as: ? An oral appliance. This is a mouthpiece that shifts your lower jaw forward. ? A CPAP device. This device blows air through a mask when you breathe out (exhale). ? An EPAP device. This has valves that you put in each nostril. ? A BPAP device. This device blows air through a mask when you breathe in (inhale) and breathe out.  Having surgery if other treatments do not work. It is important to get treatment for sleep apnea. Without treatment, it can lead to:  High blood pressure.  Coronary artery disease.  In men, not being able to have an erection (impotence).  Reduced thinking ability. Follow these instructions at home: Lifestyle  Make changes that your doctor recommends.  Eat a healthy diet.  Lose weight if needed.  Avoid alcohol, medicines to help you relax, and some pain medicines.  Do not use any products that contain nicotine or tobacco, such as cigarettes, e-cigarettes, and chewing tobacco. If you need help quitting, ask your doctor. General instructions  Take over-the-counter and prescription medicines only as told by your doctor.  If you were given a machine to use while you sleep, use it only as  told by your doctor.  If you are having surgery, make sure to tell your doctor you have sleep apnea. You may need to bring your device with you.  Keep all follow-up visits as told by your doctor. This is important. Contact a doctor if:  The machine that you were given to use during sleep bothers you or does not seem to be working.  You do not get better.  You get worse. Get help right away if:  Your chest hurts.  You have trouble breathing in enough air.  You have an uncomfortable  feeling in your back, arms, or stomach.  You have trouble talking.  One side of your body feels weak.  A part of your face is hanging down. These symptoms may be an emergency. Do not wait to see if the symptoms will go away. Get medical help right away. Call your local emergency services (911 in the [place] Do not drive yourself to the hospital. Summary  This condition affects breathing during sleep.  The most common cause is a collapsed or blocked airway.  The goal of treatment is to help you breathe normally while you sleep. This information is not intended to replace advice given to you by your health care provider. Make sure you discuss any questions you have with your health care provider. Document Released: 02/06/2008 Document Revised: 02/13/2018 Document Reviewed: 12/23/2017 Elsevier Patient Education  2020 [employer].

## 2019-02-03 ENCOUNTER — Ambulatory Visit (HOSPITAL_COMMUNITY)
Admission: RE | Admit: 2019-02-03 | Discharge: 2019-02-03 | Disposition: A | Discharge status: Home / Self Care | Payer: Medicare Other | Source: Ambulatory Visit | Attending: Family | Admitting: Family

## 2019-02-03 ENCOUNTER — Other Ambulatory Visit: Payer: Self-pay

## 2019-02-03 ENCOUNTER — Other Ambulatory Visit (HOSPITAL_COMMUNITY): Payer: Self-pay | Admitting: Internal Medicine

## 2019-02-03 DIAGNOSIS — R0989 Other specified symptoms and signs involving the circulatory and respiratory systems: Secondary | ICD-10-CM | POA: Diagnosis not present

## 2019-03-02 ENCOUNTER — Telehealth (HOSPITAL_COMMUNITY): Payer: Self-pay | Admitting: *Deleted

## 2019-03-02 NOTE — Telephone Encounter (Signed)
02/20/19 patient will call back to reschedule when appropriate to do so.

## 2019-03-05 ENCOUNTER — Other Ambulatory Visit: Payer: Self-pay

## 2019-03-05 ENCOUNTER — Ambulatory Visit: Payer: Medicare Other | Admitting: Cardiovascular Disease

## 2019-03-05 ENCOUNTER — Encounter: Payer: Self-pay | Admitting: Cardiovascular Disease

## 2019-03-05 VITALS — BP 116/82 | HR 71 | Ht 65.0 in | Wt 193.0 lb

## 2019-03-05 DIAGNOSIS — I10 Essential (primary) hypertension: Secondary | ICD-10-CM | POA: Diagnosis not present

## 2019-03-05 DIAGNOSIS — G4733 Obstructive sleep apnea (adult) (pediatric): Secondary | ICD-10-CM

## 2019-03-05 DIAGNOSIS — R002 Palpitations: Secondary | ICD-10-CM | POA: Diagnosis not present

## 2019-03-05 DIAGNOSIS — Q211 Atrial septal defect, unspecified: Secondary | ICD-10-CM

## 2019-03-05 DIAGNOSIS — E7800 Pure hypercholesterolemia, unspecified: Secondary | ICD-10-CM | POA: Diagnosis not present

## 2019-03-05 DIAGNOSIS — Z9989 Dependence on other enabling machines and devices: Secondary | ICD-10-CM

## 2019-03-05 DIAGNOSIS — Q2112 Patent foramen ovale: Secondary | ICD-10-CM

## 2019-03-05 NOTE — Progress Notes (Signed)
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 There were plans for percutaneous closure by a Dr. Dwain Sarna in June/July 2006. She refused to undergo a cardiac MRI due to fears of dye allergy. She also has hypertension, hyperlipidemia, mild sleep apnea, and carotid artery disease.  She underwent a transthoracic echocardiogram with bubble study on 05/12/2017.  This demonstrated normal left ventricular systolic function and regional wall motion, LVEF 60 to 65%, and grade 1 diastolic dysfunction.  Color Doppler suggested a very small left-to-right shunt although the bubble study appeared to be negative.  Color Doppler suggested a probable PFO or small ASD, and the location was difficult to determine based on visualized portions of the septum.  That being said, the shunt appeared to be very small and normal right-sided chamber sizes would suggest it is nonsignificant physiologically.  A nurse from her insurance company did a home visit and appreciated bruits on physical exam and irregular heartbeats.  Her PCP performed an ECG which I do not have a copy of at this time.  Carotid Dopplers demonstrated minimal plaque disease bilaterally on 02/03/2019.  She has had episodic palpitations but she has not noticed any sustained palpitations.  She denies chest pain, leg swelling, dizziness, and shortness of breath.  She is caffeine sensitive and tries to drink decaffeinated beverages.  When she consumes chocolate she occasionally has palpitations.  She denies any history of recent panic attacks but has had these in the remote past.    Review of Systems: As per "subjective", otherwise negative.  Allergies  Allergen Reactions  . Aspirin Other (See Comments)    Abdominal pain  . Benzocaine   . Caffeine     Panic attacks  . Codeine Swelling  . Lidocaine-Epinephrine Other (See Comments)  . Oxycodone-Acetaminophen Swelling  . Percocet  [Oxycodone-Acetaminophen] Swelling  . Pravastatin     myalgia  . Procaine Hcl   . Pseudoephedrine Hcl Er Other (See Comments)    Jittery, nervousness  . Simvastatin     REACTION: hair loss    Current Outpatient Medications  Medication Sig Dispense Refill  . antiseptic oral rinse (BIOTENE) LIQD 15 mLs by Mouth Rinse route as needed for dry mouth.    . Ascorbic Acid (VITAMIN C PO) Take by mouth.    . losartan (COZAAR) 50 MG tablet Take 50 mg by mouth daily.    . metFORMIN (GLUCOPHAGE-XR) 500 MG 24 hr tablet Take 500 mg by mouth at bedtime.     . metroNIDAZOLE (METROGEL) 0.75 % gel Apply 1 application topically 2 (two) times daily.    . Misc. Devices MISC CPAP    . Polyethyl Glycol-Propyl Glycol (SYSTANE OP) Apply to eye.    Marland Kitchen Pyridoxine HCl (VITAMIN B6 PO) Take by mouth.    . TURMERIC PO Take 1 tablet by mouth daily.     Current Facility-Administered Medications  Medication Dose Route Frequency Provider Last Rate Last Dose  . 0.9 %  sodium chloride infusion  500 mL Intravenous Continuous Pyrtle, Lajuan Lines, MD        Past Medical History:  Diagnosis Date  . Allergy   . Anxiety   . Atrial septal defect   . Depression   . Diabetes mellitus without complication ([place])   . Hip pain, right   . Hyperlipidemia   . Hypertension   . Neuromuscular disorder (HCC)    neuropathy  . Obesity   .  Osteoarthritis of lumbar spine   . Osteoarthritis of thumb    bilateral  . Prediabetes   . Renal disorder    kidney stones  . Sleep apnea    uses CPAP    Past Surgical History:  Procedure Laterality Date  . BREAST REDUCTION SURGERY    . BREAST SURGERY  2008   Right breast  . BUNIONECTOMY  1993   Right foot  . CATARACT EXTRACTION    . CYSTOSCOPY W/ URETERAL STENT PLACEMENT Left 05/31/2015   Procedure: CYSTOSCOPY WITH STENT EXCHANGE;  Surgeon: Cleon Gustin, MD;  Location: [facility];  Service: Urology;  Laterality: Left;  . CYSTOSCOPY WITH RETROGRADE PYELOGRAM, URETEROSCOPY AND STENT  PLACEMENT Left 05/19/2015   Procedure: CYSTOSCOPY WITH RETROGRADE PYELOGRAM, URETEROSCOPY AND STENT PLACEMENT;  Surgeon: Cleon Gustin, MD;  Location: [facility];  Service: Urology;  Laterality: Left;  . CYSTOSCOPY WITH RETROGRADE PYELOGRAM, URETEROSCOPY AND STENT PLACEMENT Left 02/14/2018   Procedure: CYSTOSCOPY WITH RETROGRADE PYELOGRAM, diagnostic URETEROSCOPY;  Surgeon: Alexis Frock, MD;  Location: [facility];  Service: Urology;  Laterality: Left;  . CYSTOSCOPY/RETROGRADE/URETEROSCOPY/STONE EXTRACTION WITH BASKET Left 05/31/2015   Procedure: CYSTOSCOPY/RETROGRADE/STONE EXTRACTION WITH BASKET;  Surgeon: Cleon Gustin, MD;  Location: [facility];  Service: Urology;  Laterality: Left;  . eyelid surgery Bilateral    Blephroplasty  . REDUCTION MAMMAPLASTY Bilateral 2003  . STONE EXTRACTION WITH BASKET Left 05/19/2015   Procedure: STONE EXTRACTION WITH BASKET;  Surgeon: Cleon Gustin, MD;  Location: [facility];  Service: Urology;  Laterality: Left;  . TONSILLECTOMY    . TOTAL HIP ARTHROPLASTY  08/2010   Right hip    Social History   Socioeconomic History  . Marital status: Married    Spouse name: Gwyndolyn Saxon  . Number of children: 0  . Years of education: Master's  . Highest education level: Not on file  Occupational History  . Not on file  Social Needs  . Financial resource strain: Not on file  . Food insecurity    Worry: Not on file    Inability: Not on file  . Transportation needs    Medical: Not on file    Non-medical: Not on file  Tobacco Use  . Smoking status: Former Smoker    Packs/day: 0.50    Years: 10.00    Pack years: 5.00    Types: Cigarettes    Start date: 12/11/1965    Quit date: 07/30/1975    Years since quitting: 43.6  . Smokeless tobacco: Never Used  Substance and Sexual Activity  . Alcohol use: No    Alcohol/week: 0.0 standard drinks  . Drug use: No  . Sexual activity: Not on file  Lifestyle  . Physical activity    Days per week: Not on file    Minutes per  session: Not on file  . Stress: Not on file  Relationships  . Social Herbalist on phone: Not on file    Gets together: Not on file    Attends religious service: Not on file    Active member of club or organization: Not on file    Attends meetings of clubs or organizations: Not on file    Relationship status: Not on file  . Intimate partner violence    Fear of current or ex partner: Not on file    Emotionally abused: Not on file    Physically abused: Not on file    Forced sexual activity: Not on file  Other Topics Concern  .  Not on file  Social History Narrative   Patient is married Gwyndolyn Saxon) and lives at home with her husband.   Patient is a retired Pharmacist, hospital.   Patient has a Master's degree.   Patient is right-handed.   Patient does not drink any caffeine.     Vitals:   03/05/19 1512  BP: 116/82  Pulse: 71  SpO2: 97%  Weight: 193 lb (87.5 kg)  Height: 5\' 5"  (1.651 m)    Wt Readings from Last 3 Encounters:  03/05/19 193 lb (87.5 kg)  12/21/18 190 lb 9.6 oz (86.5 kg)  05/14/18 192 lb 12.8 oz (87.5 kg)     PHYSICAL EXAM General: NAD HEENT: Normal. Neck: No JVD, no thyromegaly. Lungs: Clear to auscultation bilaterally with normal respiratory effort. CV: Regular rate and rhythm with premature contractions noted, normal S1/S2, no [id number], soft 1/6 systolic murmur heard throughout precordium. No pretibial or periankle edema.  No carotid bruit.   Abdomen: Soft, nontender, no distention.  Neurologic: Alert and oriented.  Psych: Normal affect. Skin: Normal. Musculoskeletal: No gross deformities.      Labs: Lab Results  Component Value Date/Time   K 3.9 05/18/2015 08:01 AM   BUN 21 (H) 05/18/2015 08:01 AM   CREATININE 1.18 (H) 05/18/2015 08:01 AM   ALT 43 05/11/2015 11:32 PM   HGB 13.2 05/18/2015 08:01 AM     Lipids: Lab Results  Component Value Date/Time   LDLDIRECT 206.0 01/10/2012 11:24 AM   CHOL 285 (H) 01/10/2012 11:24 AM   TRIG 198.0 (H)  01/10/2012 11:24 AM   HDL 54.60 01/10/2012 11:24 AM       ASSESSMENT AND PLAN:  1. Atrial septal defect/low secundum [id number] stable.  Most recent echocardiogram with bubble study from December 2018 reviewed above demonstrating a possible small and physiologically insignificant PFO versus small ASD.  Right-sided chamber sizes were normal.  2. 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  No changes.  3. Hyperlipidemia: Statins led to myalgias.  4. Bilateral carotid artery disease: Minimal bilateral coronary artery disease by carotid Dopplers on 02/03/2019.  5. Sleep apnea: On CPAP.  6.  Palpitations: She has premature contractions noted on physical exam.  She denies sustained tachycardia/palpitations.  I will request a copy of the ECG performed by her PCP.  I will check a basic metabolic panel, TSH, and magnesium to assess for readily correctable abnormalities.  I do not feel cardiac monitoring is warranted at this time given the paucity of symptoms.   Disposition: Follow up 6 months   Kate Sable, M.D., F.A.C.C.

## 2019-03-05 NOTE — Patient Instructions (Signed)
Medication Instructions:  Continue all current medications.  Labwork:  TSH, Magnesium, BMET - orders given today.   Office will contact with results via phone or letter.    Testing/Procedures: none  Follow-Up: Your physician wants you to follow up in: 6 months.  You will receive a reminder letter in the mail one-two months in advance.  If you don't receive a letter, please call our office to schedule the follow up appointment   Any Other Special Instructions Will Be Listed Below (If Applicable).  If you need a refill on your cardiac medications before your next appointment, please call your pharmacy.

## 2019-03-08 ENCOUNTER — Other Ambulatory Visit (HOSPITAL_COMMUNITY): Payer: Self-pay | Admitting: Internal Medicine

## 2019-03-08 ENCOUNTER — Telehealth (HOSPITAL_COMMUNITY): Payer: Self-pay | Admitting: *Deleted

## 2019-03-08 DIAGNOSIS — I739 Peripheral vascular disease, unspecified: Secondary | ICD-10-CM

## 2019-03-08 NOTE — Telephone Encounter (Signed)

## 2019-03-09 ENCOUNTER — Other Ambulatory Visit: Payer: Self-pay

## 2019-03-09 ENCOUNTER — Ambulatory Visit (HOSPITAL_COMMUNITY)
Admission: RE | Admit: 2019-03-09 | Discharge: 2019-03-09 | Disposition: A | Discharge status: Home / Self Care | Payer: Medicare Other | Source: Ambulatory Visit | Attending: Family | Admitting: Family

## 2019-03-09 ENCOUNTER — Encounter: Payer: Self-pay | Admitting: *Deleted

## 2019-03-09 DIAGNOSIS — I739 Peripheral vascular disease, unspecified: Secondary | ICD-10-CM | POA: Insufficient documentation

## 2019-03-10 ENCOUNTER — Other Ambulatory Visit (HOSPITAL_COMMUNITY)
Admission: RE | Admit: 2019-03-10 | Discharge: 2019-03-10 | Disposition: A | Discharge status: Home / Self Care | Payer: Medicare Other | Source: Ambulatory Visit | Attending: Cardiovascular Disease | Admitting: Cardiovascular Disease

## 2019-03-10 DIAGNOSIS — I10 Essential (primary) hypertension: Secondary | ICD-10-CM | POA: Insufficient documentation

## 2019-03-10 DIAGNOSIS — R002 Palpitations: Secondary | ICD-10-CM | POA: Diagnosis present

## 2019-03-10 LAB — BASIC METABOLIC PANEL
Anion gap: 8 (ref 5–15)
BUN: 18 mg/dL (ref 8–23)
CO2: 28 mmol/L (ref 22–32)
Calcium: 8.7 mg/dL — ABNORMAL LOW (ref 8.9–10.3)
Chloride: 104 mmol/L (ref 98–111)
Creatinine, Ser: 0.69 mg/dL (ref 0.44–1.00)
GFR calc Af Amer: >60 mL/min (ref >60)
GFR calc non Af Amer: >60 mL/min (ref >60)
Glucose, Bld: 115 mg/dL — ABNORMAL HIGH (ref 70–99)
Potassium: 4.7 mmol/L (ref 3.5–5.1)
Sodium: 140 mmol/L (ref 135–145)

## 2019-03-10 LAB — TSH: TSH: 1.503 u[IU]/mL (ref 0.350–4.500)

## 2019-03-10 LAB — MAGNESIUM: Magnesium: 2.3 mg/dL (ref 1.7–2.4)

## 2019-05-24 ENCOUNTER — Encounter: Payer: Self-pay | Admitting: Internal Medicine

## 2019-06-14 ENCOUNTER — Encounter: Payer: Medicare Other | Admitting: Internal Medicine

## 2019-09-03 ENCOUNTER — Other Ambulatory Visit (HOSPITAL_COMMUNITY): Payer: Self-pay | Admitting: Internal Medicine

## 2019-09-03 DIAGNOSIS — Z1231 Encounter for screening mammogram for malignant neoplasm of breast: Secondary | ICD-10-CM

## 2019-09-07 ENCOUNTER — Ambulatory Visit: Payer: Medicare Other | Admitting: Cardiovascular Disease

## 2019-09-07 ENCOUNTER — Other Ambulatory Visit: Payer: Self-pay

## 2019-09-07 ENCOUNTER — Encounter: Payer: Self-pay | Admitting: Cardiovascular Disease

## 2019-09-07 VITALS — BP 128/64 | HR 63 | Ht 65.0 in | Wt 192.0 lb

## 2019-09-07 DIAGNOSIS — I10 Essential (primary) hypertension: Secondary | ICD-10-CM

## 2019-09-07 DIAGNOSIS — Q211 Atrial septal defect, unspecified: Secondary | ICD-10-CM

## 2019-09-07 DIAGNOSIS — Z9989 Dependence on other enabling machines and devices: Secondary | ICD-10-CM

## 2019-09-07 DIAGNOSIS — E7800 Pure hypercholesterolemia, unspecified: Secondary | ICD-10-CM | POA: Diagnosis not present

## 2019-09-07 DIAGNOSIS — R002 Palpitations: Secondary | ICD-10-CM

## 2019-09-07 DIAGNOSIS — Q2112 Patent foramen ovale: Secondary | ICD-10-CM

## 2019-09-07 DIAGNOSIS — I739 Peripheral vascular disease, unspecified: Secondary | ICD-10-CM

## 2019-09-07 DIAGNOSIS — G4733 Obstructive sleep apnea (adult) (pediatric): Secondary | ICD-10-CM

## 2019-09-07 NOTE — Progress Notes (Signed)
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 There were plans for percutaneous closure by a Dr. Dwain Sarna in June/July 2006. She refused to undergo a cardiac MRI due to fears of dye allergy. She also has hypertension, hyperlipidemia, mild sleep apnea, and carotid artery disease.  She underwent a transthoracic echocardiogram with bubble study on 05/12/2017.  This demonstrated normal left ventricular systolic function and regional wall motion, LVEF 60 to 65%, and grade 1 diastolic dysfunction.  Color Doppler suggested a very small left-to-right shunt although the bubble study appeared to be negative.  Color Doppler suggested a probable PFO or small ASD, and the location was difficult to determine based on visualized portions of the septum.  That being said, the shunt appeared to be very small and normal right-sided chamber sizes would suggest it is nonsignificant physiologically.  Carotid Dopplers demonstrated minimal plaque disease bilaterally on 02/03/2019.  She was diagnosed with polymyalgia rheumatica earlier this year.  She had been experiencing left infra axillary discomfort followed by bilateral shoulder and posterior neck discomfort.  She was unable to bathe herself and could not clean her house.  She now sees a rheumatologist in [place] and has been on a prednisone taper and has cleaned her entire house.  She feels remarkably improved.  She denies exertional chest pain and dyspnea as well as leg swelling.  She very seldom has palpitations.    Review of Systems: As per "subjective", otherwise negative.  Allergies  Allergen Reactions  . Aspirin Other (See Comments)    Abdominal pain  . Benzocaine   . Caffeine     Panic attacks  . Codeine Swelling  . Lidocaine-Epinephrine Other (See Comments)  . Oxycodone-Acetaminophen Swelling  . Percocet [Oxycodone-Acetaminophen] Swelling  . Pravastatin     myalgia  .  Procaine Hcl   . Pseudoephedrine Hcl Er Other (See Comments)    Jittery, nervousness  . Simvastatin     REACTION: hair loss    Current Outpatient Medications  Medication Sig Dispense Refill  . antiseptic oral rinse (BIOTENE) LIQD 15 mLs by Mouth Rinse route as needed for dry mouth.    . Ascorbic Acid (VITAMIN C PO) Take by mouth.    . Calcium Carb-Cholecalciferol (CALCIUM 500 + D3 PO) Take by mouth.    . losartan (COZAAR) 50 MG tablet Take 50 mg by mouth daily.    . metFORMIN (GLUCOPHAGE-XR) 500 MG 24 hr tablet Take 500 mg by mouth at bedtime.     . metroNIDAZOLE (METROGEL) 0.75 % gel Apply 1 application topically 2 (two) times daily.    . Misc. Devices MISC CPAP    . Polyethyl Glycol-Propyl Glycol (SYSTANE OP) Apply to eye.    . predniSONE (DELTASONE) 10 MG tablet Take 10 mg by mouth daily with breakfast. TITRATION    . TURMERIC PO Take 1 tablet by mouth daily.     Current Facility-Administered Medications  Medication Dose Route Frequency Provider Last Rate Last Admin  . 0.9 %  sodium chloride infusion  500 mL Intravenous Continuous Pyrtle, Lajuan Lines, MD        Past Medical History:  Diagnosis Date  . Allergy   . Anxiety   . Atrial septal defect   . Depression   . Diabetes mellitus without complication ([place])   . Hip pain, right   . Hyperlipidemia   . Hypertension   . Neuromuscular disorder (HCC)    neuropathy  .  Obesity   . Osteoarthritis of lumbar spine   . Osteoarthritis of thumb    bilateral  . Prediabetes   . Renal disorder    kidney stones  . Sleep apnea    uses CPAP    Past Surgical History:  Procedure Laterality Date  . BREAST REDUCTION SURGERY    . BREAST SURGERY  2008   Right breast  . BUNIONECTOMY  1993   Right foot  . CATARACT EXTRACTION    . CYSTOSCOPY W/ URETERAL STENT PLACEMENT Left 05/31/2015   Procedure: CYSTOSCOPY WITH STENT EXCHANGE;  Surgeon: Cleon Gustin, MD;  Location: [facility];  Service: Urology;  Laterality: Left;  . CYSTOSCOPY WITH  RETROGRADE PYELOGRAM, URETEROSCOPY AND STENT PLACEMENT Left 05/19/2015   Procedure: CYSTOSCOPY WITH RETROGRADE PYELOGRAM, URETEROSCOPY AND STENT PLACEMENT;  Surgeon: Cleon Gustin, MD;  Location: [facility];  Service: Urology;  Laterality: Left;  . CYSTOSCOPY WITH RETROGRADE PYELOGRAM, URETEROSCOPY AND STENT PLACEMENT Left 02/14/2018   Procedure: CYSTOSCOPY WITH RETROGRADE PYELOGRAM, diagnostic URETEROSCOPY;  Surgeon: Alexis Frock, MD;  Location: [facility];  Service: Urology;  Laterality: Left;  . CYSTOSCOPY/RETROGRADE/URETEROSCOPY/STONE EXTRACTION WITH BASKET Left 05/31/2015   Procedure: CYSTOSCOPY/RETROGRADE/STONE EXTRACTION WITH BASKET;  Surgeon: Cleon Gustin, MD;  Location: [facility];  Service: Urology;  Laterality: Left;  . eyelid surgery Bilateral    Blephroplasty  . REDUCTION MAMMAPLASTY Bilateral 2003  . STONE EXTRACTION WITH BASKET Left 05/19/2015   Procedure: STONE EXTRACTION WITH BASKET;  Surgeon: Cleon Gustin, MD;  Location: [facility];  Service: Urology;  Laterality: Left;  . TONSILLECTOMY    . TOTAL HIP ARTHROPLASTY  08/2010   Right hip    Social History   Socioeconomic History  . Marital status: Married    Spouse name: Gwyndolyn Saxon  . Number of children: 0  . Years of education: Master's  . Highest education level: Not on file  Occupational History  . Not on file  Tobacco Use  . Smoking status: Former Smoker    Packs/day: 0.50    Years: 10.00    Pack years: 5.00    Types: Cigarettes    Start date: 12/11/1965    Quit date: 07/30/1975    Years since quitting: 44.1  . Smokeless tobacco: Never Used  Substance and Sexual Activity  . Alcohol use: No    Alcohol/week: 0.0 standard drinks  . Drug use: No  . Sexual activity: Not on file  Other Topics Concern  . Not on file  Social History Narrative   Patient is married Gwyndolyn Saxon) and lives at home with her husband.   Patient is a retired Pharmacist, hospital.   Patient has a Master's degree.   Patient is right-handed.   Patient does not  drink any caffeine.   Social Determinants of Health   Financial Resource Strain:   . Difficulty of Paying Living Expenses:   Food Insecurity:   . Worried About Charity fundraiser in the Last Year:   . Arboriculturist in the Last Year:   Transportation Needs:   . Film/video editor (Medical):   Marland Kitchen Lack of Transportation (Non-Medical):   Physical Activity:   . Days of Exercise per Week:   . Minutes of Exercise per Session:   Stress:   . Feeling of Stress :   Social Connections:   . Frequency of Communication with Friends and Family:   . Frequency of Social Gatherings with Friends and Family:   . Attends Religious Services:   . Active Member  of Clubs or Organizations:   . Attends Archivist Meetings:   Marland Kitchen Marital Status:   Intimate Partner Violence:   . Fear of Current or Ex-Partner:   . Emotionally Abused:   Marland Kitchen Physically Abused:   . Sexually Abused:    Orson Slick, LPN was present throughout the entirety of the encounter.  Vitals:   09/07/19 0833  BP: 128/64  Pulse: 63  SpO2: 98%  Weight: 192 lb (87.1 kg)  Height: 5\' 5"  (1.651 m)    Wt Readings from Last 3 Encounters:  09/07/19 192 lb (87.1 kg)  03/05/19 193 lb (87.5 kg)  12/21/18 190 lb 9.6 oz (86.5 kg)     PHYSICAL EXAM General: NAD HEENT: Normal. Neck: No JVD, no thyromegaly. Lungs: Clear to auscultation bilaterally with normal respiratory effort. CV: Regular rate and rhythm with premature contractions noted, normal S1/S2, no [id number], soft 1/6 systolic murmur heard throughout precordium. No pretibial or periankle edema.  No carotid bruit.   Abdomen: Soft, nontender, no distention.  Neurologic: Alert and oriented.  Psych: Normal affect. Skin: Normal. Musculoskeletal: No gross deformities.      Labs: Lab Results  Component Value Date/Time   K 4.7 03/10/2019 11:20 AM   BUN 18 03/10/2019 11:20 AM   CREATININE 0.69 03/10/2019 11:20 AM   ALT 43 05/11/2015 11:32 PM   TSH 1.503 03/10/2019  11:20 AM   HGB 13.2 05/18/2015 08:01 AM     Lipids: Lab Results  Component Value Date/Time   LDLDIRECT 206.0 01/10/2012 11:24 AM   CHOL 285 (H) 01/10/2012 11:24 AM   TRIG 198.0 (H) 01/10/2012 11:24 AM   HDL 54.60 01/10/2012 11:24 AM       ASSESSMENT AND PLAN:  1. Atrial septal defect/low secundum [id number] stable.  Most recent echocardiogram with bubble study from December 2018 reviewed above demonstrating a possible small and physiologically insignificant PFO versus small ASD.  Right-sided chamber sizes were normal.  2. 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  No changes.  3. Hyperlipidemia: Statins led to myalgias.  4. Bilateral carotid artery disease: Minimal bilateral coronary artery disease by carotid Dopplers on 02/03/2019.  5. Sleep apnea: On CPAP.  6.  Palpitations: Potassium, TSH, and magnesium were all normal on 03/10/2019.  Symptoms are seldom in occurrence.   Disposition: Follow up 6 months virtual visit   Kate Sable, M.D., F.A.C.C.

## 2019-09-07 NOTE — Patient Instructions (Signed)
Medication Instructions:  Continue all current medications.   Labwork: none  Testing/Procedures: none  Follow-Up: 6 months   Any Other Special Instructions Will Be Listed Below (If Applicable).   If you need a refill on your cardiac medications before your next appointment, please call your pharmacy.  

## 2019-09-10 ENCOUNTER — Ambulatory Visit: Payer: Medicare Other | Admitting: Cardiovascular Disease

## 2019-09-27 ENCOUNTER — Ambulatory Visit (HOSPITAL_COMMUNITY)
Admission: RE | Admit: 2019-09-27 | Discharge: 2019-09-27 | Disposition: A | Discharge status: Home / Self Care | Payer: Medicare Other | Source: Ambulatory Visit | Attending: Internal Medicine | Admitting: Internal Medicine

## 2019-09-27 ENCOUNTER — Encounter (HOSPITAL_COMMUNITY): Payer: Self-pay

## 2019-09-27 ENCOUNTER — Other Ambulatory Visit: Payer: Self-pay

## 2019-09-27 DIAGNOSIS — Z1231 Encounter for screening mammogram for malignant neoplasm of breast: Secondary | ICD-10-CM | POA: Diagnosis present

## 2019-12-06 ENCOUNTER — Other Ambulatory Visit: Payer: Self-pay

## 2019-12-06 ENCOUNTER — Encounter: Payer: Self-pay | Admitting: Urology

## 2019-12-06 ENCOUNTER — Ambulatory Visit (INDEPENDENT_AMBULATORY_CARE_PROVIDER_SITE_OTHER): Payer: Medicare Other | Admitting: Urology

## 2019-12-06 ENCOUNTER — Ambulatory Visit (HOSPITAL_COMMUNITY)
Admission: RE | Admit: 2019-12-06 | Discharge: 2019-12-06 | Disposition: A | Discharge status: Home / Self Care | Payer: Medicare Other | Source: Ambulatory Visit | Attending: Urology | Admitting: Urology

## 2019-12-06 VITALS — BP 130/68 | HR 78 | Temp 99.2°F | Ht 65.0 in | Wt 190.0 lb

## 2019-12-06 DIAGNOSIS — N200 Calculus of kidney: Secondary | ICD-10-CM

## 2019-12-06 LAB — MICROSCOPIC EXAMINATION
RBC: NONE SEEN /hpf (ref 0–2)
Renal Epithel, UA: NONE SEEN /hpf

## 2019-12-06 LAB — URINALYSIS, ROUTINE W REFLEX MICROSCOPIC
Bilirubin, UA: NEGATIVE
Glucose, UA: NEGATIVE
Nitrite, UA: NEGATIVE
Protein,UA: NEGATIVE
RBC, UA: NEGATIVE
Specific Gravity, UA: 1.02 (ref 1.005–1.030)
Urobilinogen, Ur: 0.2 mg/dL (ref 0.2–1.0)
pH, UA: 5.5 (ref 5.0–7.5)

## 2019-12-06 NOTE — Progress Notes (Signed)
12/06/2019 2:44 PM   Julie Owen [birth year] [id number]  Referring provider: Leanna Battles, MD [address] [place],  [place] [address]  Nephrolithiasis  HPI: Ms Julie Owen is a 74yo here for followup for nephroloithiasis. No stone events since last visit. NO recent imaging. She drinks 48oz+ of water daily. No significant LUTS. No hematuria   Her records from [facility] are as follows: -I have kidney stones. HPI: Julie Owen is a 74 year-old female established patient who is here for renal calculi.  The problem is on the left side. She first stated noticing pain on approximately 01/23/2018. This is not her first kidney stone. Her first stone was approximately 02/11/2008. She has had 3 stones prior to getting this one. She is currently having back pain and groin pain. She denies having flank pain, nausea, vomiting, fever, and chills. She has not caught a stone in her urine strainer since her symptoms began.   02/13/2018: She presented to our office on 9/16 with left flank pain and was diagnosed with a 74mm left UPJ calculusus. She has not passed her stone. She has associated suprapubic pain, urinary urgency, frequency.   02/23/2018: She is s/p L URS by Dr. Tresa Moore. She developed dull suprapubic pain which lasted 16 hours which has since resolved.   04/07/2018: 24hr urine showed a low volume of 1.17L. normal calcium. normal uric acid. iPTH, Ca, uric acid normal.   10/08/2018: No stone events since last visit. 2 weeks ago she developed suprapubic pain and presented to urgent care and was diagnosed with a UTI. She has lower back pain which is dull and intermittent. Stretching exercises makes the pain worse.   10/26/18: This patient completed Doxy x 1 week, and her suprapubic pressure resolved. She then took a Zetia tablet for the first time, and then experienced diarrhea, bloating and recurrence of suprapubic pressure and discomfort. Today, symptoms are improving, and she is feeling much better. She denies  dysuria, gross hematuria, or difficulty voiding. She denies fever, chills, nausea, or vomiting.   04/15/2019: no stone events since last visit. Renal US shows calcifications without shadowing.      PMH: Past Medical History:  Diagnosis Date   Allergy    Anxiety    Atrial septal defect    Depression    Diabetes mellitus without complication (HCC)    Hip pain, right    Hyperlipidemia    Hypertension    Neuromuscular disorder (HCC)    neuropathy   Obesity    Osteoarthritis of lumbar spine    Osteoarthritis of thumb    bilateral   Prediabetes    Renal disorder    kidney stones   Sleep apnea    uses CPAP    Surgical History: Past Surgical History:  Procedure Laterality Date   BREAST REDUCTION SURGERY     BREAST SURGERY  2008   Right breast   BUNIONECTOMY  1993   Right foot   CATARACT EXTRACTION     CYSTOSCOPY W/ URETERAL STENT PLACEMENT Left 05/31/2015   Procedure: CYSTOSCOPY WITH STENT EXCHANGE;  Surgeon: Cleon Gustin, MD;  Location: [facility];  Service: Urology;  Laterality: Left;   CYSTOSCOPY WITH RETROGRADE PYELOGRAM, URETEROSCOPY AND STENT PLACEMENT Left 05/19/2015   Procedure: CYSTOSCOPY WITH RETROGRADE PYELOGRAM, URETEROSCOPY AND STENT PLACEMENT;  Surgeon: Cleon Gustin, MD;  Location: [facility];  Service: Urology;  Laterality: Left;   CYSTOSCOPY WITH RETROGRADE PYELOGRAM, URETEROSCOPY AND STENT PLACEMENT Left 02/14/2018   Procedure: CYSTOSCOPY WITH RETROGRADE PYELOGRAM, diagnostic  URETEROSCOPY;  Surgeon: Alexis Frock, MD;  Location: [facility];  Service: Urology;  Laterality: Left;   CYSTOSCOPY/RETROGRADE/URETEROSCOPY/STONE EXTRACTION WITH BASKET Left 05/31/2015   Procedure: CYSTOSCOPY/RETROGRADE/STONE EXTRACTION WITH BASKET;  Surgeon: Cleon Gustin, MD;  Location: [facility];  Service: Urology;  Laterality: Left;   eyelid surgery Bilateral    Blephroplasty   REDUCTION MAMMAPLASTY Bilateral 2003   STONE EXTRACTION WITH BASKET Left  05/19/2015   Procedure: STONE EXTRACTION WITH BASKET;  Surgeon: Cleon Gustin, MD;  Location: [facility];  Service: Urology;  Laterality: Left;   TONSILLECTOMY     TOTAL HIP ARTHROPLASTY  08/2010   Right hip    Home Medications:  Allergies as of 12/06/2019      Reactions   Aspirin Other (See Comments)   Abdominal pain   Benzocaine    Caffeine    Panic attacks   Codeine Swelling   Lidocaine-epinephrine Other (See Comments)   Oxycodone-acetaminophen Swelling   Percocet [oxycodone-acetaminophen] Swelling   Pravastatin    myalgia   Procaine Hcl    Pseudoephedrine Hcl Er Other (See Comments)   Jittery, nervousness   Simvastatin    REACTION: hair loss      Medication List       Accurate as of December 06, 2019  2:44 PM. If you have any questions, ask your nurse or doctor.        antiseptic oral rinse Liqd 15 mLs by Mouth Rinse route as needed for dry mouth.   CALCIUM 500 + D3 PO Take by mouth.   calcium-vitamin D 500-200 MG-UNIT tablet Commonly known as: OSCAL WITH D Take 1 tablet by mouth.   losartan 50 MG tablet Commonly known as: COZAAR Take 50 mg by mouth daily.   metFORMIN 500 MG 24 hr tablet Commonly known as: GLUCOPHAGE-XR Take 500 mg by mouth at bedtime.   metroNIDAZOLE 0.75 % gel Commonly known as: METROGEL Apply 1 application topically 2 (two) times daily.   Misc. Devices Misc CPAP   predniSONE 10 MG tablet Commonly known as: DELTASONE Take 10 mg by mouth daily with breakfast. TITRATION   predniSONE 5 MG tablet Commonly known as: DELTASONE Take 10 mg by mouth daily.   SYSTANE OP Apply to eye.   TURMERIC PO Take 1 tablet by mouth daily.   VITAMIN C PO Take by mouth.       Allergies:  Allergies  Allergen Reactions   Aspirin Other (See Comments)    Abdominal pain   Benzocaine    Caffeine     Panic attacks   Codeine Swelling   Lidocaine-Epinephrine Other (See Comments)   Oxycodone-Acetaminophen Swelling   Percocet  [Oxycodone-Acetaminophen] Swelling   Pravastatin     myalgia   Procaine Hcl    Pseudoephedrine Hcl Er Other (See Comments)    Jittery, nervousness   Simvastatin     REACTION: hair loss    Family History: Family History  Problem Relation Age of Onset   Hyperlipidemia Mother    Hypertension Mother    Heart disease Mother    Macular degeneration Father    Cancer Sister        colon   Hypertension Sister    Breast cancer Sister    Hypertension Other    Kidney disease Other    Stroke Other    Heart attack Other    Cancer Other    Obesity Other     Social History:  reports that she quit smoking about 44 years ago. Her smoking  use included cigarettes. She started smoking about 54 years ago. She has a 5.00 pack-year smoking history. She has never used smokeless tobacco. She reports that she does not drink alcohol and does not use drugs.  ROS: All other review of systems were reviewed and are negative except what is noted above in HPI  Physical Exam: BP (!) 130/68    Pulse 78    Temp 99.2 F (37.3 C)    Ht 5\' 5"  (1.651 m)    Wt 190 lb (86.2 kg)    BMI 31.62 kg/m   Constitutional:  Alert and oriented, No acute distress. HEENT: [place] AT, moist mucus membranes.  Trachea midline, no masses. Cardiovascular: No clubbing, cyanosis, or edema. Respiratory: Normal respiratory effort, no increased work of breathing. GI: Abdomen is soft, nontender, nondistended, no abdominal masses GU: No CVA tenderness.  Lymph: No cervical or inguinal lymphadenopathy. Skin: No rashes, bruises or suspicious lesions. Neurologic: Grossly intact, no focal deficits, moving all 4 extremities. Psychiatric: Normal mood and affect.  Laboratory Data: Lab Results  Component Value Date   WBC 9.4 05/18/2015   HGB 13.2 05/18/2015   HCT 40.8 05/18/2015   MCV 92.5 05/18/2015   PLT 255 05/18/2015    Lab Results  Component Value Date   CREATININE 0.69 03/10/2019    No results found for:  PSA  No results found for: TESTOSTERONE  Lab Results  Component Value Date   HGBA1C 5.8 01/10/2012    Urinalysis    Component Value Date/Time   COLORURINE YELLOW 05/18/2015 1050   APPEARANCEUR CLOUDY (A) 05/18/2015 1050   LABSPEC 1.014 05/18/2015 1050   PHURINE 6.5 05/18/2015 1050   GLUCOSEU NEGATIVE 05/18/2015 1050   HGBUR LARGE (A) 05/18/2015 1050   BILIRUBINUR NEGATIVE 05/18/2015 1050   KETONESUR 15 (A) 05/18/2015 1050   PROTEINUR NEGATIVE 05/18/2015 1050   UROBILINOGEN 0.2 04/09/2012 1800   NITRITE NEGATIVE 05/18/2015 1050   LEUKOCYTESUR SMALL (A) 05/18/2015 1050    Lab Results  Component Value Date   BACTERIA FEW (A) 05/18/2015    Pertinent Imaging:  Results for orders placed during the hospital encounter of 05/13/15  DG Abd 1 View  Narrative CLINICAL DATA:  Left flank pain  EXAM: ABDOMEN - 1 VIEW  COMPARISON:  04/13/2012  FINDINGS: There are no disproportionally dilated loops of bowel. There is no obvious free intraperitoneal gas. Right total hip arthroplasty is stable.  IMPRESSION: Nonobstructive bowel gas pattern.   Electronically Signed By: Marybelle Killings M.D. On: 05/13/2015 14:43  No results found for this or any previous visit.  No results found for this or any previous visit.  No results found for this or any previous visit.  Results for orders placed during the hospital encounter of 05/18/15  US Renal  Narrative CLINICAL DATA:  Ureteral stone.  EXAM: RENAL / URINARY TRACT ULTRASOUND COMPLETE  COMPARISON:  CT 05/11/2015 .  FINDINGS: Right Kidney:  Length: 10.9 cm. Echogenicity within normal limits. No mass or hydronephrosis visualized.  Left Kidney:  Length: 13.4 cm. Echogenicity within normal limits. No mass. Moderate left hydronephrosis again noted.  Bladder:  Appears normal for degree of bladder distention.  Still note is made of increased echogenicity of the liver consistent with fatty infiltration and/or  hepatocellular disease.  IMPRESSION: 1.  Moderate left hydronephrosis again noted.  2. Incidental note is made of increased echogenicity of the liver consistent fatty infiltration and/or hepatocellular disease.   Electronically Signed By: Marcello Moores  Register On: 05/18/2015 10:37  No results  found for this or any previous visit.  No results found for this or any previous visit.  Results for orders placed during the hospital encounter of 05/11/15  CT RENAL STONE STUDY  Narrative CLINICAL DATA:  74 year old female with left-sided flank pain.  EXAM: CT ABDOMEN AND PELVIS WITHOUT CONTRAST  TECHNIQUE: Multidetector CT imaging of the abdomen and pelvis was performed following the standard protocol without IV contrast.  COMPARISON:  CT dated 04/09/2012 and lumbar spine MRI dated 09/05/2011  FINDINGS: Evaluation of this exam is limited in the absence of intravenous contrast. Minimal bibasilar linear atelectasis/scarring. The visualized lung bases are otherwise clear. No intra-abdominal free air or free fluid.  Diffuse hepatic steatosis. An 8 mm ill-defined right hepatic hypodense lesion (series 2, image 34) is not well characterized on this noncontrast study but likely represents a cyst or hemangioma. CT with contrast or MRI is recommended for further evaluation. The gallbladder, pancreas, spleen, adrenal glands, right kidney and right ureter, and urinary bladder appear unremarkable. There is a 4 mm left ureteropelvic junction stone with mild left hydronephrosis. Hysterectomy.  There is sigmoid diverticulosis without active inflammatory changes. There is a 1.4 cm low attenuatin/fatty lesion in the distal small bowel in the right lower quadrant (series 2, image 60) which may represent a small lipoma. There is no evidence of bowel obstruction. Normal appendix.  There is aortoiliac atherosclerotic disease. No portal venous gas identified. There is no adenopathy.  Small fat  containing umbilical hernia. The abdominal wall soft tissues appear unremarkable. There is a right hip arthroplasty. There multilevel degenerative changes of the spine. No acute fractures.  IMPRESSION: A 4 mm left UPJ stone with mild left hydronephrosis.  Fatty liver. A subcentimeter right hepatic hypodense lesion, likely a hemangioma or cyst. MRI may provide better characterization.   Electronically Signed By: Anner Crete M.D. On: 05/11/2015 23:45   Assessment & Plan:    1. Kidney stone -renal US today, will call with results. If [place] is negative for calculi I will see her back in 1 year with a renal US - Urinalysis, Routine w reflex microscopic   No follow-ups on file.  Nicolette Bang, MD  [facility] [place]

## 2019-12-06 NOTE — Progress Notes (Signed)
Urological Symptom Review  Patient is experiencing the following symptoms: Frequent urination Hard to postpone urination Get up at night to urinate Leakage of urine  Kidney stones Review of Systems  Gastrointestinal (upper)  : Negative for upper GI symptoms  Gastrointestinal (lower) : Negative for lower GI symptoms  Constitutional : Night Sweats Fatigue  Skin: Itching  Eyes: Blurred vision  Ear/Nose/Throat : Sinus problems  Hematologic/Lymphatic: Negative for Hematologic/Lymphatic symptoms  Cardiovascular : Negative for cardiovascular symptoms  Respiratory : Cough Shortness of breath  Endocrine: Negative for endocrine symptoms  Musculoskeletal: Back pain  Neurological: Negative for neurological symptoms  Psychologic: Anxiety

## 2019-12-06 NOTE — Patient Instructions (Signed)
Dietary Guidelines to Help Prevent Kidney Stones Kidney stones are deposits of minerals and salts that form inside your kidneys. Your risk of developing kidney stones may be greater depending on your diet, your lifestyle, the medicines you take, and whether you have certain medical conditions. Most people can reduce their chances of developing kidney stones by following the instructions below. Depending on your overall health and the type of kidney stones you tend to develop, your dietitian may give you more specific instructions. What are tips for following this plan? Reading food labels  Choose foods with "no salt added" or "low-salt" labels. Limit your sodium intake to less than 1500 mg per day.  Choose foods with calcium for each meal and snack. Try to eat about 300 mg of calcium at each meal. Foods that contain 200-500 mg of calcium per serving include: ? 8 oz (237 ml) of milk, fortified nondairy milk, and fortified fruit juice. ? 8 oz (237 ml) of kefir, yogurt, and soy yogurt. ? 4 oz (118 ml) of tofu. ? 1 oz of cheese. ? 1 cup (300 g) of dried figs. ? 1 cup (91 g) of cooked broccoli. ? 1-3 oz can of sardines or mackerel.  Most people need 1000 to 1500 mg of calcium each day. Talk to your dietitian about how much calcium is recommended for you. Shopping  Buy plenty of fresh fruits and vegetables. Most people do not need to avoid fruits and vegetables, even if they contain nutrients that may contribute to kidney stones.  When shopping for convenience foods, choose: ? Whole pieces of fruit. ? Premade salads with dressing on the side. ? Low-fat fruit and yogurt smoothies.  Avoid buying frozen meals or prepared deli foods.  Look for foods with live cultures, such as yogurt and kefir. Cooking  Do not add salt to food when cooking. Place a salt shaker on the table and allow each person to add his or her own salt to taste.  Use vegetable protein, such as beans, textured vegetable  protein (TVP), or tofu instead of meat in pasta, casseroles, and soups. Meal planning   Eat less salt, if told by your dietitian. To do this: ? Avoid eating processed or premade food. ? Avoid eating fast food.  Eat less animal protein, including cheese, meat, poultry, or fish, if told by your dietitian. To do this: ? Limit the number of times you have meat, poultry, fish, or cheese each week. Eat a diet free of meat at least 2 days a week. ? Eat only one serving each day of meat, poultry, fish, or seafood. ? When you prepare animal protein, cut pieces into small portion sizes. For most meat and fish, one serving is about the size of one deck of cards.  Eat at least 5 servings of fresh fruits and vegetables each day. To do this: ? Keep fruits and vegetables on hand for snacks. ? Eat 1 piece of fruit or a handful of berries with breakfast. ? Have a salad and fruit at lunch. ? Have two kinds of vegetables at dinner.  Limit foods that are high in a substance called oxalate. These include: ? Spinach. ? Rhubarb. ? Beets. ? Potato chips and french fries. ? Nuts.  If you regularly take a diuretic medicine, make sure to eat at least 1-2 fruits or vegetables high in potassium each day. These include: ? Avocado. ? Banana. ? Orange, prune, carrot, or tomato juice. ? Baked potato. ? Cabbage. ? Beans and split   peas. General instructions   Drink enough fluid to keep your urine clear or pale yellow. This is the most important thing you can do.  Talk to your health care provider and dietitian about taking daily supplements. Depending on your health and the cause of your kidney stones, you may be advised: ? Not to take supplements with vitamin C. ? To take a calcium supplement. ? To take a daily probiotic supplement. ? To take other supplements such as magnesium, fish oil, or vitamin B6.  Take all medicines and supplements as told by your health care provider.  Limit alcohol intake to no  more than 1 drink a day for nonpregnant women and 2 drinks a day for men. One drink equals 12 oz of beer, 5 oz of wine, or 1 oz of hard liquor.  Lose weight if told by your health care provider. Work with your dietitian to find strategies and an eating plan that works best for you. What foods are not recommended? Limit your intake of the following foods, or as told by your dietitian. Talk to your dietitian about specific foods you should avoid based on the type of kidney stones and your overall health. Grains Breads. Bagels. Rolls. Baked goods. Salted crackers. Cereal. Pasta. Vegetables Spinach. Rhubarb. Beets. Canned vegetables. Pickles. Olives. Meats and other protein foods Nuts. Nut butters. Large portions of meat, poultry, or fish. Salted or cured meats. Deli meats. Hot dogs. Sausages. Dairy Cheese. Beverages Regular soft drinks. Regular vegetable juice. Seasonings and other foods Seasoning blends with salt. Salad dressings. Canned soups. Soy sauce. Ketchup. Barbecue sauce. Canned pasta sauce. Casseroles. Pizza. Lasagna. Frozen meals. Potato chips. French fries. Summary  You can reduce your risk of kidney stones by making changes to your diet.  The most important thing you can do is drink enough fluid. You should drink enough fluid to keep your urine clear or pale yellow.  Ask your health care provider or dietitian how much protein from animal sources you should eat each day, and also how much salt and calcium you should have each day. This information is not intended to replace advice given to you by your health care provider. Make sure you discuss any questions you have with your health care provider. Document Revised: 08/19/2018 Document Reviewed: 04/09/2016 Elsevier Patient Education  2020 [employer]  

## 2019-12-07 NOTE — Progress Notes (Signed)
Results mailed 

## 2019-12-22 ENCOUNTER — Ambulatory Visit: Payer: Medicare Other | Admitting: Family Medicine

## 2019-12-22 ENCOUNTER — Encounter: Payer: Self-pay | Admitting: Family Medicine

## 2019-12-22 VITALS — BP 143/72 | HR 71 | Ht 65.0 in | Wt 194.6 lb

## 2019-12-22 DIAGNOSIS — L659 Nonscarring hair loss, unspecified: Secondary | ICD-10-CM | POA: Diagnosis not present

## 2019-12-22 DIAGNOSIS — G4733 Obstructive sleep apnea (adult) (pediatric): Secondary | ICD-10-CM

## 2019-12-22 DIAGNOSIS — Z9989 Dependence on other enabling machines and devices: Secondary | ICD-10-CM | POA: Diagnosis not present

## 2019-12-22 NOTE — Patient Instructions (Addendum)
Please continue using your CPAP regularly. While your insurance requires that you use CPAP at least 4 hours each night on 70% of the nights, I recommend, that you not skip any nights and use it throughout the night if you can. Getting used to CPAP and staying with the treatment long term does take time and patience and discipline. Untreated obstructive sleep apnea when it is moderate to severe can have an adverse impact on cardiovascular health and raise her risk for heart disease, arrhythmias, hypertension, congestive heart failure, stroke and diabetes. Untreated obstructive sleep apnea causes sleep disruption, nonrestorative sleep, and sleep deprivation. This can have an impact on your day to day functioning and cause daytime sleepiness and impairment of cognitive function, memory loss, mood disturbance, and problems focussing. Using CPAP regularly can improve these symptoms.  I will send you for a mask refitting due to concerns of hair breakage/loss. Please follow up with dermatology and PCP as well.   Let me know if you start having any trouble with your machine.   Follow up in 1 year, sooner if needed.    Sleep Apnea Sleep apnea affects breathing during sleep. It causes breathing to stop for a short time or to become shallow. It can also increase the risk of:  Heart attack.  Stroke.  Being very overweight (obese).  Diabetes.  Heart failure.  Irregular heartbeat. The goal of treatment is to help you breathe normally again. What are the causes? There are three kinds of sleep apnea:  Obstructive sleep apnea. This is caused by a blocked or collapsed airway.  Central sleep apnea. This happens when the brain does not send the right signals to the muscles that control breathing.  Mixed sleep apnea. This is a combination of obstructive and central sleep apnea. The most common cause of this condition is a collapsed or blocked airway. This can happen if:  Your throat muscles are too  relaxed.  Your tongue and tonsils are too large.  You are overweight.  Your airway is too small. What increases the risk?  Being overweight.  Smoking.  Having a small airway.  Being older.  Being female.  Drinking alcohol.  Taking medicines to calm yourself (sedatives or tranquilizers).  Having family members with the condition. What are the signs or symptoms?  Trouble staying asleep.  Being sleepy or tired during the day.  Getting angry a lot.  Loud snoring.  Headaches in the morning.  Not being able to focus your mind (concentrate).  Forgetting things.  Less interest in sex.  Mood swings.  Personality changes.  Feelings of sadness (depression).  Waking up a lot during the night to pee (urinate).  Dry mouth.  Sore throat. How is this diagnosed?  Your medical history.  A physical exam.  A test that is done when you are sleeping (sleep study). The test is most often done in a sleep lab but may also be done at home. How is this treated?   Sleeping on your side.  Using a medicine to get rid of mucus in your nose (decongestant).  Avoiding the use of alcohol, medicines to help you relax, or certain pain medicines (narcotics).  Losing weight, if needed.  Changing your diet.  Not smoking.  Using a machine to open your airway while you sleep, such as: ? An oral appliance. This is a mouthpiece that shifts your lower jaw forward. ? A CPAP device. This device blows air through a mask when you breathe out (exhale). ?  An EPAP device. This has valves that you put in each nostril. ? A BPAP device. This device blows air through a mask when you breathe in (inhale) and breathe out.  Having surgery if other treatments do not work. It is important to get treatment for sleep apnea. Without treatment, it can lead to:  High blood pressure.  Coronary artery disease.  In men, not being able to have an erection (impotence).  Reduced thinking  ability. Follow these instructions at home: Lifestyle  Make changes that your doctor recommends.  Eat a healthy diet.  Lose weight if needed.  Avoid alcohol, medicines to help you relax, and some pain medicines.  Do not use any products that contain nicotine or tobacco, such as cigarettes, e-cigarettes, and chewing tobacco. If you need help quitting, ask your doctor. General instructions  Take over-the-counter and prescription medicines only as told by your doctor.  If you were given a machine to use while you sleep, use it only as told by your doctor.  If you are having surgery, make sure to tell your doctor you have sleep apnea. You may need to bring your device with you.  Keep all follow-up visits as told by your doctor. This is important. Contact a doctor if:  The machine that you were given to use during sleep bothers you or does not seem to be working.  You do not get better.  You get worse. Get help right away if:  Your chest hurts.  You have trouble breathing in enough air.  You have an uncomfortable feeling in your back, arms, or stomach.  You have trouble talking.  One side of your body feels weak.  A part of your face is hanging down. These symptoms may be an emergency. Do not wait to see if the symptoms will go away. Get medical help right away. Call your local emergency services (911 in the [place] Do not drive yourself to the hospital. Summary  This condition affects breathing during sleep.  The most common cause is a collapsed or blocked airway.  The goal of treatment is to help you breathe normally while you sleep. This information is not intended to replace advice given to you by your health care provider. Make sure you discuss any questions you have with your health care provider. Document Revised: 02/13/2018 Document Reviewed: 12/23/2017 [employer] Patient Education  [place].

## 2019-12-22 NOTE — Progress Notes (Signed)
Order for cpap supplies sent to [facility] via community msg. Confirmation received that the order transmitted was successful.

## 2019-12-22 NOTE — Progress Notes (Signed)
PATIENT: NATANYA HOLECEK DOB: 1945/10/20  REASON FOR VISIT: follow up HISTORY FROM: patient  Chief Complaint  Patient presents with   Follow-up    70yr f/u for OSA. States machine has been working well but has some hair loss from straps.    room 6    alone     HISTORY OF PRESENT ILLNESS: Today 12/22/19 TAJ ARTEAGA is a 74 y.o. female here today for follow up for OSA on CPAP. She is doing well. She is using CPAP every night. She has noted hair loss/breakage over the past 4-5 weeks. She is uncertain of cause. She is seeing dermatology next week for an evaluation. She is weaning prednisone for polymyalgia rheumatica. She is followed by PCP regularly and reports normal labs. No new medications or stressors. She is using a Dreamwear Nasal pillow with headgear. She has used this mask for about 5 years.   Compliance report dated 11/21/2019 through 12/20/2019 reveals that she is used CPAP 30 of the past 30 days for compliance of 100%.  She has used CPAP greater than 4 hours 30 of the last 30 days for compliance of 100%.  Average usage is 8 hours and 36 minutes.  Residual AHI is 2.0 on 8 cm of water and an EPR of 2.  There is no significant leak noted.  HISTORY: (copied from my note on 12/21/2018)  MAECYN PANNING is a 74 y.o. female here today for follow up of OSA on CPAP.  She is doing very well with CPAP at home.  She is using her machine nightly.  She does note that she is resting better and has more energy during the day.  She began CPAP therapy in October 2015.  She is eligible for a new machine in October of this year.  She returns for evaluation.  Compliance report dated 11/17/2018 through 12/16/2018 reveals that she is using CPAP every night for compliance of 100%.  She is using CPAP greater than 4 hours every night.  Average usage is 9 hours and 10 minutes.  AHI was 0.9 on 8 cm of water and an EPR of 2.  There was no significant leak.  HISTORY: (copied from Dr Dohmeier's note on  12/18/2017)  Revisit date is 18 December 2017, I have the pleasure of seeing Keonia Pasko. Rightmyer today for a yearly revisit for evaluation of CPAP compliance in the treatment of obstructive sleep apnea. Mrs. Lilas Diefendorf is meanwhile 74 years old she continues to use her CPAP for his highest compliance, 100% for the last 30 days with an average use at time of 7 hours 51 minutes, CPAP is set at 8 cmH2O with2 cm EPR and her residual AHI is 0.8/h.She is using an air sense 10 AutoSet the air leaks were mild, and she has not been woken by snoring, gasping for air or feeling air hungry. She is followed by [facility].  She endorsed today the fatigue severity score 27 and the Epworth sleepiness score at 5 out of 24 points, with a geriatric depression score endorsed at 11out of 15. 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 He is demented and has CHF.  The stress made her sick, and her fathers cardiologist had refered him to hospice for respite care.  4-5 -2018. Metha Kolasa Olsonis a 74 y.o.female, who is seen here as a referral from Dr.Patersonfor an insomnia  and snoring evaluation, ruling out apnea. Mrs. Felker describes today a very troubling development. But she had no difficulties getting adjusted to CPAP use and for the first year was a very compliant user she then developed a tendency to mouth breathe-woke up with a parched dry "sticky"tongue, and now was placed in a full face mask with little improvement. Her sleep time has always been only 4-5 hours at night before CPAP was initiated but now her sleep time is even less. She endorsed the fatigue severity score today at 32 points, the Epworth sleepiness score at 8 and the geriatric depression score at a very high rate at 10 out of 15 Points. She uses a chin strip.  Her compliance has been excellent in spite of the troubles that 100% 5 hours 19 minutes each night ( but was 6-7  hours before) , 8 cm water pressure is currently used was 27 m EPR the residual AHI is 1.3. Her humidifier was not adjusted by the durable medical equipment company, she is currently followed by [facility].She had no medication adjustments, and we were not able to identify other factors that could have led to a dry mouth. She does have dry eyes now- to she reports that she has an air leak into the eyes which irritates the eyes. Systane gel at night, drops in daytime. I am looking for sicca symptoms and for a more comfortable mask. I will again place her on a nasasl pillow. Currently on an [employer].   Consultation, CD The patient is a retired Pharmacist, hospital, and still has some engrained sleep habits, such as rising at 5 AM as she did for over 40 years. She is not sleepy but fatigued, she exclaimed. She had related the changes in her sleep pattern to her menopause. She developed palpitations from caffeine in peri menopause, felt panicked and anxious. She used" to sleep like a baby", but at age 62 she found herself in completed Menopause. She could finally relate hot flushes and mood changes, tearfulness. Her sleep never recovered. She gained weight and begun snoring. Her sleep has become less and less deep, restorative.  The patient reports she usually goes to bed around 9 PM watches TV in the bedroom. About one hour later she will go to sleep, but wakes up 2-3 times to go to the bathroom. If she turns over she will wake up, too. She has sometimes trouble to go back to sleep. She always wakes up at 2.30 and she finds no further restful sleep after that, rises at 5.30, spontaneously, no alarm needed. She often has a dry mouth when waking up. She does not report any nocturnal headaches usually. The overall nocturnal sleep time is between 4-5 hours only, she takes One hour nap in the afternoon, about 2-3 PM, and on her sofa. Her husband sleeps not in the same room, she snores and has woken herself  up form snoring. Her dog sleeps nearby, but never wakes her. She has owned a dog before that needed physically to be brought outside to urinate , 7-8 times at night. This dog died about 2 years ago.  She doesn't take any caffeine anymore. The sleep pattern may have changed than.  Family history - one cousin with OSA , non compliant CPAP . Reviewed medication and alLergies, HISTORY   06-10-14 Mrs. Wohlfarth is here today for her first visit after sleep study and CPAP titration. The patient underwent a polysomnography on 12-17-13 which revealed a mild apnea  with an AHI of 10.5 and an RDI of 14.6 per hour but associated with oxygen desaturations. In the setting of snoring and periodic limb movements a CPAP titration was not performed the same night as the patient did not have enough sleep time at line. She returned on 02-18-14 for the CPAP titration, her AHI was reduced to 1.4, periodic limb movements were rare - and she did best at 8 cm water pressure under which she slept 492 minutes.   PLAN : [facility] to exchange the machine, ASAP. RV in 3 month with NP for follow up.  The patient still has some nasal congestion which may contribute to her difficulties breathing at night at least on occasion. I recommend a saltwater nasal spray to just literally flush the nostril. I have generated an electronic order to apnea to make sure that the patients machine will be exchanged for another one ASAP. We do not have to change the mask, Interface, Tubing or filter at this time.  Interval history from 08-15-14 Mrs.Profeta has seen benefit of a new CPAP machine. She had undergone an overnight pulse oximetry by using CPAP. But had downloaded data spoke for a very well controlled sleep apnea with an residual AHI file below 5 her fatigue severity was very high and she was excessively daytime sleepy as well.  It turned out that she remained hypoxemic and that a new machine was needed to control her sleep apnea as well as her  oxygen levels. Her durable medical equipment company, namely a [employer], exchanged her old not longer working machine for a new model which now is doing well. Her fatigue severity score today for further is 19 and her Epworth sleepiness score is 4 points. I was also able to get a new date download. The patient had been diagnosed with sleep apnea seems to do well on the current setting of 8 cm water. She continues to use an Eason nasal mask in medium size. Heated humidification his knee is used. The AHI on 06-10-14 was 0.4 , at a 97% compliance.The patient reports problems with [facility], being billed several hundred USD a month for a rental machine- and having not filed for insurance coverage ? A broken machine needs to be replaced and is aninsurance covered expense.   2017 , RVMrs. Giacobbe is now on her third CPAP machine and received her most recent in December 2016.  She is meanwhile 74 years of age she is using her CPAP with excellent compliance 100% of days and 100% of those days over 4 hours, with an average hourly use of 7 hours and 46 minutes and the set pressure of 8 cm water this 2 cm expiratory pressure relief.  Her residual AHI is 0.7, which speaks for an excellent resolution. The 95th percentile airleak is close to 8 L/m which is a minimal air leak.   REVIEW OF SYSTEMS: Out of a complete 14 system review of symptoms, the patient complains only of the following symptoms, muscle aches, hair loss, snoring and all other reviewed systems are negative.  FSS: 47 ESS: 5  ALLERGIES: Allergies  Allergen Reactions   Aspirin Other (See Comments)    Abdominal pain   Benzocaine    Caffeine     Panic attacks   Codeine Swelling   Lidocaine-Epinephrine Other (See Comments)   Oxycodone-Acetaminophen Swelling   Percocet [Oxycodone-Acetaminophen] Swelling   Pravastatin     myalgia   Procaine Hcl    Pseudoephedrine Hcl Er Other (See Comments)  Jittery, nervousness   Simvastatin      REACTION: hair loss    HOME MEDICATIONS: Outpatient Medications Prior to Visit  Medication Sig Dispense Refill   antiseptic oral rinse (BIOTENE) LIQD 15 mLs by Mouth Rinse route as needed for dry mouth.     Ascorbic Acid (VITAMIN C PO) Take by mouth.     Calcium Carb-Cholecalciferol (CALCIUM 500 + D3 PO) Take by mouth.     calcium-vitamin D (OSCAL WITH D) 500-200 MG-UNIT tablet Take 1 tablet by mouth.     losartan (COZAAR) 50 MG tablet Take 50 mg by mouth daily.     metFORMIN (GLUCOPHAGE-XR) 500 MG 24 hr tablet Take 500 mg by mouth at bedtime.      metroNIDAZOLE (METROGEL) 0.75 % gel Apply 1 application topically 2 (two) times daily.     Misc. Devices MISC CPAP     Polyethyl Glycol-Propyl Glycol (SYSTANE OP) Apply to eye.     predniSONE (DELTASONE) 5 MG tablet Take 10 mg by mouth daily.     TURMERIC PO Take 1 tablet by mouth daily.     predniSONE (DELTASONE) 10 MG tablet Take 10 mg by mouth daily with breakfast. TITRATION     Facility-Administered Medications Prior to Visit  Medication Dose Route Frequency Provider Last Rate Last Admin   0.9 %  sodium chloride infusion  500 mL Intravenous Continuous Pyrtle, Lajuan Lines, MD        PAST MEDICAL HISTORY: Past Medical History:  Diagnosis Date   Allergy    Anxiety    Atrial septal defect    Depression    Diabetes mellitus without complication (HCC)    Hip pain, right    Hyperlipidemia    Hypertension    Neuromuscular disorder (HCC)    neuropathy   Obesity    Osteoarthritis of lumbar spine    Osteoarthritis of thumb    bilateral   Prediabetes    Renal disorder    kidney stones   Sleep apnea    uses CPAP    PAST SURGICAL HISTORY: Past Surgical History:  Procedure Laterality Date   BREAST REDUCTION SURGERY     BREAST SURGERY  2008   Right breast   BUNIONECTOMY  1993   Right foot   CATARACT EXTRACTION     CYSTOSCOPY W/ URETERAL STENT PLACEMENT Left 05/31/2015   Procedure: CYSTOSCOPY WITH  STENT EXCHANGE;  Surgeon: Cleon Gustin, MD;  Location: [facility];  Service: Urology;  Laterality: Left;   CYSTOSCOPY WITH RETROGRADE PYELOGRAM, URETEROSCOPY AND STENT PLACEMENT Left 05/19/2015   Procedure: CYSTOSCOPY WITH RETROGRADE PYELOGRAM, URETEROSCOPY AND STENT PLACEMENT;  Surgeon: Cleon Gustin, MD;  Location: [facility];  Service: Urology;  Laterality: Left;   CYSTOSCOPY WITH RETROGRADE PYELOGRAM, URETEROSCOPY AND STENT PLACEMENT Left 02/14/2018   Procedure: CYSTOSCOPY WITH RETROGRADE PYELOGRAM, diagnostic URETEROSCOPY;  Surgeon: Alexis Frock, MD;  Location: [facility];  Service: Urology;  Laterality: Left;   CYSTOSCOPY/RETROGRADE/URETEROSCOPY/STONE EXTRACTION WITH BASKET Left 05/31/2015   Procedure: CYSTOSCOPY/RETROGRADE/STONE EXTRACTION WITH BASKET;  Surgeon: Cleon Gustin, MD;  Location: [facility];  Service: Urology;  Laterality: Left;   eyelid surgery Bilateral    Blephroplasty   REDUCTION MAMMAPLASTY Bilateral 2003   STONE EXTRACTION WITH BASKET Left 05/19/2015   Procedure: STONE EXTRACTION WITH BASKET;  Surgeon: Cleon Gustin, MD;  Location: [facility];  Service: Urology;  Laterality: Left;   TONSILLECTOMY     TOTAL HIP ARTHROPLASTY  08/2010   Right hip    FAMILY HISTORY: Family  History  Problem Relation Age of Onset   Hyperlipidemia Mother    Hypertension Mother    Heart disease Mother    Macular degeneration Father    Cancer Sister        colon   Hypertension Sister    Breast cancer Sister    Hypertension Other    Kidney disease Other    Stroke Other    Heart attack Other    Cancer Other    Obesity Other     SOCIAL HISTORY: Social History   Socioeconomic History   Marital status: Married    Spouse name: Gwyndolyn Saxon   Number of children: 0   Years of education: Master's   Highest education level: Not on file  Occupational History   Not on file  Tobacco Use   Smoking status: Former Smoker    Packs/day: 0.50    Years: 10.00    Pack  years: 5.00    Types: Cigarettes    Start date: 12/11/1965    Quit date: 07/30/1975    Years since quitting: 44.4   Smokeless tobacco: Never Used  Substance and Sexual Activity   Alcohol use: No    Alcohol/week: 0.0 standard drinks   Drug use: No   Sexual activity: Not on file  Other Topics Concern   Not on file  Social History Narrative   Patient is married Gwyndolyn Saxon) and lives at home with her husband.   Patient is a retired Pharmacist, hospital.   Patient has a Master's degree.   Patient is right-handed.   Patient does not drink any caffeine.   Social Determinants of Health   Financial Resource Strain:    Difficulty of Paying Living Expenses:   Food Insecurity:    Worried About Charity fundraiser in the Last Year:    Arboriculturist in the Last Year:   Transportation Needs:    Film/video editor (Medical):    Lack of Transportation (Non-Medical):   Physical Activity:    Days of Exercise per Week:    Minutes of Exercise per Session:   Stress:    Feeling of Stress :   Social Connections:    Frequency of Communication with Friends and Family:    Frequency of Social Gatherings with Friends and Family:    Attends Religious Services:    Active Member of Clubs or Organizations:    Attends Archivist Meetings:    Marital Status:   Intimate Partner Violence:    Fear of Current or Ex-Partner:    Emotionally Abused:    Physically Abused:    Sexually Abused:       PHYSICAL EXAM  Vitals:   12/22/19 1105  BP: (!) 143/72  Pulse: 71  Weight: 194 lb 9.6 oz (88.3 kg)  Height: 5\' 5"  (1.651 m)   Body mass index is 32.38 kg/m.  Generalized: Well developed, in no acute distress  Cardiology: normal rate and rhythm, no murmur noted Respiratory: clear to auscultation bilaterally  Neurological examination  Mentation: Alert oriented to time, place, history taking. Follows all commands speech and language fluent Cranial nerve II-XII: Pupils were equal  round reactive to light. Extraocular movements were full, visual field were full on confrontational test. Facial sensation and strength were normal. Head turning and shoulder shrug  were normal and symmetric. Motor: The motor testing reveals 5 over 5 strength of all 4 extremities. Good symmetric motor tone is noted throughout.  Gait and station: Gait is normal.  DIAGNOSTIC DATA (LABS, IMAGING, TESTING) - I reviewed patient records, labs, notes, testing and imaging myself where available.  No flowsheet data found.   Lab Results  Component Value Date   WBC 9.4 05/18/2015   HGB 13.2 05/18/2015   HCT 40.8 05/18/2015   MCV 92.5 05/18/2015   PLT 255 05/18/2015      Component Value Date/Time   NA 140 03/10/2019 1120   K 4.7 03/10/2019 1120   CL 104 03/10/2019 1120   CO2 28 03/10/2019 1120   GLUCOSE 115 (H) 03/10/2019 1120   BUN 18 03/10/2019 1120   CREATININE 0.69 03/10/2019 1120   CALCIUM 8.7 (L) 03/10/2019 1120   PROT 6.9 05/11/2015 2332   ALBUMIN 4.1 05/11/2015 2332   AST 42 (H) 05/11/2015 2332   ALT 43 05/11/2015 2332   ALKPHOS 52 05/11/2015 2332   BILITOT 0.8 05/11/2015 2332   GFRNONAA >60 03/10/2019 1120   GFRAA >60 03/10/2019 1120   Lab Results  Component Value Date   CHOL 285 (H) 01/10/2012   HDL 54.60 01/10/2012   LDLDIRECT 206.0 01/10/2012   TRIG 198.0 (H) 01/10/2012   CHOLHDL 5 01/10/2012   Lab Results  Component Value Date   HGBA1C 5.8 01/10/2012   No results found for: VITAMINB12 Lab Results  Component Value Date   TSH 1.503 03/10/2019       ASSESSMENT AND PLAN 74 y.o. year old female  has a past medical history of Allergy, Anxiety, Atrial septal defect, Depression, Diabetes mellitus without complication ([place]), Hip pain, right, Hyperlipidemia, Hypertension, Neuromuscular disorder ([place]), Obesity, Osteoarthritis of lumbar spine, Osteoarthritis of thumb, Prediabetes, Renal disorder, and Sleep apnea. here with     ICD-10-CM   1. OSA on CPAP  G47.33 For  home use only DME continuous positive airway pressure (CPAP)   Z99.89   2. Hair loss  L65.9     Ellie is doing great from a CPAP perspective. Compliance report reveals excellent compliance. She was encouraged to continue using CPAP nightly and greater than 4 hours each night. I will send an order to DME for mask refitting to see if there are any other head gear options that may help with hair loss as thinning seems to be in locations where headgear lies. She was advised to follow up with dermatology and PCP to ensure no metabolic reason for hair loss. She denies any concerns with her CPAP. It is greater than 35 years old. She wishes to continuing using current CPAP. She is aware to reach out to me if she should start having trouble with her machine. She will follow up in 1 year. She verbalizes understanding and agreement with this plan.    Orders Placed This Encounter  Procedures   For home use only DME continuous positive airway pressure (CPAP)    Mask refitting please    Order Specific Question:   Length of Need    Answer:   Lifetime    Order Specific Question:   Patient has OSA or probable OSA    Answer:   Yes    Order Specific Question:   Is the patient currently using CPAP in the home    Answer:   Yes    Order Specific Question:   Settings    Answer:   Other see comments    Order Specific Question:   CPAP supplies needed    Answer:   Mask, headgear, cushions, filters, heated tubing and water chamber     No orders of the  defined types were placed in this encounter.     I spent 15 minutes with the patient. 50% of this time was spent counseling and educating patient on plan of care and medications.    Debbora Presto, FNP-C 12/22/2019, 11:46 AM [facility] [address] [place], [place] [address] [phone]

## 2020-04-13 ENCOUNTER — Other Ambulatory Visit: Payer: Self-pay | Admitting: Internal Medicine

## 2020-04-13 DIAGNOSIS — R5381 Other malaise: Secondary | ICD-10-CM

## 2020-05-02 ENCOUNTER — Other Ambulatory Visit: Payer: Self-pay | Admitting: Internal Medicine

## 2020-05-02 DIAGNOSIS — E2839 Other primary ovarian failure: Secondary | ICD-10-CM

## 2020-08-09 ENCOUNTER — Other Ambulatory Visit: Payer: Medicare Other

## 2020-08-21 ENCOUNTER — Other Ambulatory Visit (HOSPITAL_COMMUNITY): Payer: Self-pay | Admitting: Internal Medicine

## 2020-08-21 DIAGNOSIS — Z1231 Encounter for screening mammogram for malignant neoplasm of breast: Secondary | ICD-10-CM

## 2020-09-18 ENCOUNTER — Other Ambulatory Visit: Payer: Self-pay | Admitting: Internal Medicine

## 2020-09-18 DIAGNOSIS — M25512 Pain in left shoulder: Secondary | ICD-10-CM

## 2020-09-22 ENCOUNTER — Ambulatory Visit
Admission: RE | Admit: 2020-09-22 | Discharge: 2020-09-22 | Disposition: A | Discharge status: Home / Self Care | Payer: Medicare Other | Source: Ambulatory Visit | Attending: Internal Medicine | Admitting: Internal Medicine

## 2020-09-22 ENCOUNTER — Other Ambulatory Visit: Payer: Self-pay

## 2020-09-22 DIAGNOSIS — M25512 Pain in left shoulder: Secondary | ICD-10-CM

## 2020-09-29 ENCOUNTER — Ambulatory Visit (HOSPITAL_COMMUNITY): Payer: Medicare Other

## 2020-10-05 ENCOUNTER — Other Ambulatory Visit: Payer: Self-pay

## 2020-10-05 ENCOUNTER — Ambulatory Visit (HOSPITAL_COMMUNITY)
Admission: RE | Admit: 2020-10-05 | Discharge: 2020-10-05 | Disposition: A | Discharge status: Home / Self Care | Payer: Medicare Other | Source: Ambulatory Visit | Attending: Internal Medicine | Admitting: Internal Medicine

## 2020-10-05 DIAGNOSIS — Z1231 Encounter for screening mammogram for malignant neoplasm of breast: Secondary | ICD-10-CM

## 2020-10-10 ENCOUNTER — Other Ambulatory Visit (HOSPITAL_COMMUNITY): Payer: Self-pay | Admitting: Hematology & Oncology

## 2020-10-11 ENCOUNTER — Other Ambulatory Visit (HOSPITAL_COMMUNITY): Payer: Self-pay | Admitting: Internal Medicine

## 2020-10-17 ENCOUNTER — Other Ambulatory Visit (HOSPITAL_COMMUNITY): Payer: Self-pay | Admitting: Internal Medicine

## 2020-10-17 DIAGNOSIS — R928 Other abnormal and inconclusive findings on diagnostic imaging of breast: Secondary | ICD-10-CM

## 2020-10-19 ENCOUNTER — Encounter: Payer: Self-pay | Admitting: Physical Therapy

## 2020-10-19 ENCOUNTER — Ambulatory Visit: Payer: Medicare Other | Attending: Internal Medicine | Admitting: Physical Therapy

## 2020-10-19 ENCOUNTER — Other Ambulatory Visit: Payer: Self-pay

## 2020-10-19 DIAGNOSIS — M25612 Stiffness of left shoulder, not elsewhere classified: Secondary | ICD-10-CM | POA: Diagnosis present

## 2020-10-19 DIAGNOSIS — M25512 Pain in left shoulder: Secondary | ICD-10-CM | POA: Diagnosis not present

## 2020-10-19 NOTE — Therapy (Signed)
[facility] [place], [place], [address] Phone: [phone]   Fax:  [phone]  Physical Therapy Evaluation  Patient Details  Name: TAJANAY HURLEY MRN: [id number] Date of Birth: [birth year] Referring Provider (PT): Leigh Aurora MD   Encounter Date: 10/19/2020   PT End of Session - 10/19/20 1231     Visit Number 1    Number of Visits 12    Date for PT Re-Evaluation 01/17/21    Authorization Type FOTO AT LEAST EVERY 5TH VISIT.  PROGRESS NOTE AT 10TH VISIT.  KX MODIFIER AFTER 15 VISITS.    PT Start Time 0900    PT Stop Time 0950    PT Time Calculation (min) 50 min    Activity Tolerance Patient tolerated treatment well    Behavior During Therapy WFL for tasks assessed/performed             Past Medical History:  Diagnosis Date   Allergy    Anxiety    Atrial septal defect    Depression    Diabetes mellitus without complication (HCC)    Hip pain, right    Hyperlipidemia    Hypertension    Neuromuscular disorder (HCC)    neuropathy   Obesity    Osteoarthritis of lumbar spine    Osteoarthritis of thumb    bilateral   Prediabetes    Renal disorder    kidney stones   Sleep apnea    uses CPAP    Past Surgical History:  Procedure Laterality Date   BREAST REDUCTION SURGERY     BREAST SURGERY  2008   Right breast   BUNIONECTOMY  1993   Right foot   CATARACT EXTRACTION     CYSTOSCOPY W/ URETERAL STENT PLACEMENT Left 05/31/2015   Procedure: CYSTOSCOPY WITH STENT EXCHANGE;  Surgeon: Cleon Gustin, MD;  Location: [facility];  Service: Urology;  Laterality: Left;   CYSTOSCOPY WITH RETROGRADE PYELOGRAM, URETEROSCOPY AND STENT PLACEMENT Left 05/19/2015   Procedure: CYSTOSCOPY WITH RETROGRADE PYELOGRAM, URETEROSCOPY AND STENT PLACEMENT;  Surgeon: Cleon Gustin, MD;  Location: [facility];  Service: Urology;  Laterality: Left;   CYSTOSCOPY WITH RETROGRADE PYELOGRAM, URETEROSCOPY AND STENT PLACEMENT Left 02/14/2018    Procedure: CYSTOSCOPY WITH RETROGRADE PYELOGRAM, diagnostic URETEROSCOPY;  Surgeon: Alexis Frock, MD;  Location: [facility];  Service: Urology;  Laterality: Left;   CYSTOSCOPY/RETROGRADE/URETEROSCOPY/STONE EXTRACTION WITH BASKET Left 05/31/2015   Procedure: CYSTOSCOPY/RETROGRADE/STONE EXTRACTION WITH BASKET;  Surgeon: Cleon Gustin, MD;  Location: [facility];  Service: Urology;  Laterality: Left;   eyelid surgery Bilateral    Blephroplasty   REDUCTION MAMMAPLASTY Bilateral 2003   STONE EXTRACTION WITH BASKET Left 05/19/2015   Procedure: STONE EXTRACTION WITH BASKET;  Surgeon: Cleon Gustin, MD;  Location: [facility];  Service: Urology;  Laterality: Left;   TONSILLECTOMY     TOTAL HIP ARTHROPLASTY  08/2010   Right hip    There were no vitals filed for this visit.    Subjective Assessment - 10/19/20 1231     Subjective COVID-19 screen performed prior to patient entering clinic.  The patient presents to the clinic today with a CC of left shoulder pain that has been ongoing for about 2 months.  She cannot remember anything she might have done to cause the pain.  Her pain-level is a 6/10 today and can rise to higher levels with movement of her left shoulder.  Medication and rest decrease her pain.    Pertinent History Polymyalgia Rheumatica, right THA, OA,  DM, HTN.    Diagnostic tests MRI.    Patient Stated Goals use left UE without pain.    Currently in Pain? Yes    Pain Score 6     Pain Location Shoulder    Pain Orientation Left    Pain Descriptors / Indicators Sore;Sharp    Pain Type Acute pain    Pain Onset More than a month ago    Pain Frequency Constant    Aggravating Factors  See above.    Pain Relieving Factors See above.                [facility] PT Assessment - 10/19/20 0001       Assessment   Medical Diagnosis Left shoulder pain.    Referring Provider (PT) Leigh Aurora MD    Onset Date/Surgical Date --   ~2 months.     Precautions   Precautions None      Restrictions    Weight Bearing Restrictions No      Balance Screen   Has the patient fallen in the past 6 months No    Has the patient had a decrease in activity level because of a fear of falling?  Yes    Is the patient reluctant to leave their home because of a fear of falling?  No      Home Environment   Living Environment Private residence      Prior Function   Level of Independence Independent      Observation/Other Assessments   Focus on Therapeutic Outcomes (FOTO)  Complete.      Posture/Postural Control   Posture/Postural Control Postural limitations    Postural Limitations Rounded Shoulders;Forward head      ROM / Strength   AROM / PROM / Strength AROM;Strength      AROM   Overall AROM Comments In supine:  AA flexion to 90 degress and ER to 40 degrees.      Strength   Overall Strength Comments Left shoulder IR/ER graded grossly at 4-/5 and deltoid strength ~3+ to 4-/5.      Palpation   Palpation comment Tender to palpation over left bicipital groove.      Special Tests   Other special tests (-) Left Drop Arm test.                        Objective measurements completed on examination: See above findings.       [facility] Adult PT Treatment/Exercise - 10/19/20 0001       Modalities   Modalities Electrical Stimulation;Moist Heat      Moist Heat Therapy   Number Minutes Moist Heat 20 Minutes    Moist Heat Location --   Left shoulder.     Electrical Stimulation   Electrical Stimulation Location Left anterior shoulder.    Electrical Stimulation Action pre-mod.    Electrical Stimulation Parameters 80-150 Hz. x 20 minutes.    Electrical Stimulation Goals Pain                         PT Long Term Goals - 10/19/20 1247       PT LONG TERM GOAL #1   Title Independent with a HEP.    Time 6    Period Weeks    Status New      PT LONG TERM GOAL #2   Title Active left shoulder flexion to 145 degrees so the patient can easily  reach overhead.     Time 6    Period Weeks    Status New      PT LONG TERM GOAL #3   Title Active ER to 70 degrees+ to allow for easily donning/doffing of apparel.    Time 6    Period Weeks    Status New      PT LONG TERM GOAL #4   Title Increase shoulder strength to a solid 4+/5 to increase stability for performance of functional activities.    Time 6    Period Weeks    Status New      PT LONG TERM GOAL #5   Title Perform ADL's with pain not > 3/10.    Time 6    Period Weeks    Status New                    Plan - 10/19/20 1243     Clinical Impression Statement The patient presents to OPPT with c/o left shoulder pain.  She has a significant loss of range of motion and loss of functional use of her left UE.  She demonstrates a negative left Drop Arme test.  She is tender to palpation over her left Bicipital groove.  Patient will benefit from skilled physical therapy intervention to address pain and deficits.    Personal Factors and Comorbidities Comorbidity 1;Comorbidity 2;Other    Comorbidities Polymyalgia Rheumatica, right THA, OA, DM, HTN.    Examination-Activity Limitations Other;Reach Overhead    Examination-Participation Restrictions Other    Stability/Clinical Decision Making Evolving/Moderate complexity    Clinical Decision Making Low    Rehab Potential Good    PT Frequency 2x / week    PT Duration 6 weeks    PT Treatment/Interventions ADLs/Self Care Home Management;Cryotherapy;Electrical Stimulation;Ultrasound;Moist Heat;Iontophoresis 4mg /ml Dexamethasone;Therapeutic exercise;Therapeutic activities;Manual techniques;Passive range of motion;Vasopneumatic Device    PT Next Visit Plan Review HEP, PROM to patient's left shoulder, progress to pulleys, wall ladder and UE Ranger.  Modalities and STW/M as needed.  RW4 with yellow theraband.    Consulted and Agree with Plan of Care Patient             Patient will benefit from skilled therapeutic intervention in order to  improve the following deficits and impairments:  Pain, Decreased activity tolerance, Decreased strength, Decreased range of motion  Visit Diagnosis: Acute pain of left shoulder - Plan: PT plan of care cert/re-cert  Stiffness of left shoulder, not elsewhere classified - Plan: PT plan of care cert/re-cert     Problem List Patient Active Problem List   Diagnosis Date Noted   Kidney stone 12/06/2019   Sicca ([place]) 08/15/2016   OSA on CPAP 06/10/2014   Compliance with medication regimen 06/10/2014   Snorings 11/04/2013   Nocturia 11/04/2013   Insomnia 11/04/2013   Diabetes mellitus ([place]) 01/10/2012   Essential hypertension, benign 04/23/2010   ATRIAL SEPTAL DEFECT, HX OF 04/23/2010    Glenroy Crossen, [place] MPT 10/19/2020, 12:51 PM  [facility] [address] [place], [place], [address] Phone: [phone]   Fax:  [phone]  Name: Julie Owen MRN: [id number] Date of Birth: December 24, 1945

## 2020-10-23 ENCOUNTER — Other Ambulatory Visit: Payer: Self-pay

## 2020-10-23 ENCOUNTER — Ambulatory Visit: Payer: Medicare Other | Admitting: Physical Therapy

## 2020-10-23 DIAGNOSIS — M25612 Stiffness of left shoulder, not elsewhere classified: Secondary | ICD-10-CM

## 2020-10-23 DIAGNOSIS — M25512 Pain in left shoulder: Secondary | ICD-10-CM | POA: Diagnosis not present

## 2020-10-23 NOTE — Therapy (Addendum)
[facility] [place], [place], [address] Phone: [phone]   Fax:  [phone]  Physical Therapy Treatment  Patient Details  Name: Julie Owen MRN: [id number] Date of Birth: 09-23-1945 Referring Provider (PT): Leigh Aurora MD   Encounter Date: 10/23/2020   PT End of Session - 10/23/20 1021     Visit Number 2    Number of Visits 12    Date for PT Re-Evaluation 01/17/21    Authorization Type FOTO AT LEAST EVERY 5TH VISIT.  PROGRESS NOTE AT 10TH VISIT.  KX MODIFIER AFTER 15 VISITS.    PT Start Time 0945    PT Stop Time 1025    PT Time Calculation (min) 40 min    Activity Tolerance Patient tolerated treatment well    Behavior During Therapy WFL for tasks assessed/performed             Past Medical History:  Diagnosis Date   Allergy    Anxiety    Atrial septal defect    Depression    Diabetes mellitus without complication (HCC)    Hip pain, right    Hyperlipidemia    Hypertension    Neuromuscular disorder (HCC)    neuropathy   Obesity    Osteoarthritis of lumbar spine    Osteoarthritis of thumb    bilateral   Prediabetes    Renal disorder    kidney stones   Sleep apnea    uses CPAP    Past Surgical History:  Procedure Laterality Date   BREAST REDUCTION SURGERY     BREAST SURGERY  2008   Right breast   BUNIONECTOMY  1993   Right foot   CATARACT EXTRACTION     CYSTOSCOPY W/ URETERAL STENT PLACEMENT Left 05/31/2015   Procedure: CYSTOSCOPY WITH STENT EXCHANGE;  Surgeon: Cleon Gustin, MD;  Location: [facility];  Service: Urology;  Laterality: Left;   CYSTOSCOPY WITH RETROGRADE PYELOGRAM, URETEROSCOPY AND STENT PLACEMENT Left 05/19/2015   Procedure: CYSTOSCOPY WITH RETROGRADE PYELOGRAM, URETEROSCOPY AND STENT PLACEMENT;  Surgeon: Cleon Gustin, MD;  Location: [facility];  Service: Urology;  Laterality: Left;   CYSTOSCOPY WITH RETROGRADE PYELOGRAM, URETEROSCOPY AND STENT PLACEMENT Left 02/14/2018    Procedure: CYSTOSCOPY WITH RETROGRADE PYELOGRAM, diagnostic URETEROSCOPY;  Surgeon: Alexis Frock, MD;  Location: [facility];  Service: Urology;  Laterality: Left;   CYSTOSCOPY/RETROGRADE/URETEROSCOPY/STONE EXTRACTION WITH BASKET Left 05/31/2015   Procedure: CYSTOSCOPY/RETROGRADE/STONE EXTRACTION WITH BASKET;  Surgeon: Cleon Gustin, MD;  Location: [facility];  Service: Urology;  Laterality: Left;   eyelid surgery Bilateral    Blephroplasty   REDUCTION MAMMAPLASTY Bilateral 2003   STONE EXTRACTION WITH BASKET Left 05/19/2015   Procedure: STONE EXTRACTION WITH BASKET;  Surgeon: Cleon Gustin, MD;  Location: [facility];  Service: Urology;  Laterality: Left;   TONSILLECTOMY     TOTAL HIP ARTHROPLASTY  08/2010   Right hip    There were no vitals filed for this visit.   Subjective Assessment - 10/23/20 1015     Subjective COVID-19 screen performed prior to patient entering clinic.  The treatment helped a lot.    Pertinent History Polymyalgia Rheumatica, right THA, OA, DM, HTN.    Diagnostic tests MRI.    Patient Stated Goals use left UE without pain.    Currently in Pain? Yes    Pain Score 3     Pain Orientation Left    Pain Descriptors / Indicators Sore    Pain Onset More than a month ago  [facility] Adult PT Treatment/Exercise - 10/23/20 0001       Modalities   Modalities Electrical Stimulation;Ultrasound;Vasopneumatic      Moist Heat Therapy   Number Minutes Moist Heat 15 Minutes    Moist Heat Location --   Left shoulder.     Electrical Stimulation   Electrical Stimulation Location Left anterior shoulder.    Electrical Stimulation Action Pre-mod.    Electrical Stimulation Parameters 80-150 Hz x 15 minutes.    Electrical Stimulation Goals Pain      Ultrasound   Ultrasound Location Left anterior shoulder.    Ultrasound Parameters Combo e'stim/US at 1.50 W/CM2 x 8 minutes.    Ultrasound Goals Pain            Vasopneumatic on  low x 15 minutes.        PT Education - 10/23/20 1019     Education Details Wall climbs.    Person(s) Educated Patient    Methods Explanation                 PT Long Term Goals - 10/19/20 1247       PT LONG TERM GOAL #1   Title Independent with a HEP.    Time 6    Period Weeks    Status New      PT LONG TERM GOAL #2   Title Active left shoulder flexion to 145 degrees so the patient can easily reach overhead.    Time 6    Period Weeks    Status New      PT LONG TERM GOAL #3   Title Active ER to 70 degrees+ to allow for easily donning/doffing of apparel.    Time 6    Period Weeks    Status New      PT LONG TERM GOAL #4   Title Increase shoulder strength to a solid 4+/5 to increase stability for performance of functional activities.    Time 6    Period Weeks    Status New      PT LONG TERM GOAL #5   Title Perform ADL's with pain not > 3/10.    Time 6    Period Weeks    Status New                   Plan - 10/23/20 1018     Clinical Impression Statement The patient already reporting a significant reduction in her left shoulder pain.    Personal Factors and Comorbidities Comorbidity 1;Comorbidity 2;Other    Comorbidities Polymyalgia Rheumatica, right THA, OA, DM, HTN.    Examination-Activity Limitations Other;Reach Overhead    Examination-Participation Restrictions Other    Stability/Clinical Decision Making Evolving/Moderate complexity    Rehab Potential Good    PT Frequency 2x / week    PT Duration 6 weeks    PT Next Visit Plan Review HEP, PROM to patient's left shoulder, progress to pulleys, wall ladder and UE Ranger.  Modalities and STW/M as needed.  RW4 with yellow theraband.    Consulted and Agree with Plan of Care Patient             Patient will benefit from skilled therapeutic intervention in order to improve the following deficits and impairments:  Pain, Decreased activity tolerance, Decreased strength, Decreased range of  motion  Visit Diagnosis: Acute pain of left shoulder  Stiffness of left shoulder, not elsewhere classified     Problem List Patient Active Problem List   Diagnosis Date  Noted   Kidney stone 12/06/2019   Sicca ([place]) 08/15/2016   OSA on CPAP 06/10/2014   Compliance with medication regimen 06/10/2014   Snorings 11/04/2013   Nocturia 11/04/2013   Insomnia 11/04/2013   Diabetes mellitus ([place]) 01/10/2012   Essential hypertension, benign 04/23/2010   ATRIAL SEPTAL DEFECT, HX OF 04/23/2010    Neyla Gauntt, [place] MPT 10/23/2020, 10:26 AM  [facility] [address] [place], [place], [address] Phone: [phone]   Fax:  [phone]  Name: Julie Owen MRN: [id number] Date of Birth: March 06, 1946

## 2020-10-25 DIAGNOSIS — Q2112 Patent foramen ovale: Secondary | ICD-10-CM | POA: Insufficient documentation

## 2020-10-25 NOTE — Progress Notes (Signed)
Cardiology Office Note   Date:  10/25/2020   ID:  [name], DOB Sep 25, 1945, MRN [id number]  PCP:  Leanna Battles, MD  Cardiologist:   Minus Breeding, MD  No chief complaint on file.     History of Present Illness: Julie Owen is a 75 y.o. female who presents for evaluation of an ASD.  This was found at [facility] in the past on TEE.  She was there was a suggestion about a closure but the patient refused a cardiac MRI because of a dye allergy.  She had a transthoracic echocardiogram in 2018 with a well-preserved ejection fraction.  She is there was a complex PFO or small ASD with very small left to right shunting.  She was followed clinically and had previously been seen by Dr. Bronson Ing.    Since she was last seen she is doing well.  She denies any cardiovascular symptoms.  She is unfortunately not able to exercise because of polymyalgia rheumatica.  She is hoping to come down on her dose of prednisone. The patient denies any new symptoms such as chest discomfort, neck or arm discomfort. There has been no new shortness of breath, PND or orthopnea. There have been no reported palpitations, presyncope or syncope.    Past Medical History:  Diagnosis Date   Allergy    Anxiety    Atrial septal defect    Depression    Diabetes mellitus without complication (HCC)    Hip pain, right    Hyperlipidemia    Hypertension    Neuromuscular disorder (HCC)    neuropathy   Obesity    Osteoarthritis of lumbar spine    Osteoarthritis of thumb    bilateral   Prediabetes    Renal disorder    kidney stones   Sleep apnea    uses CPAP    Past Surgical History:  Procedure Laterality Date   BREAST REDUCTION SURGERY     BREAST SURGERY  2008   Right breast   BUNIONECTOMY  1993   Right foot   CATARACT EXTRACTION     CYSTOSCOPY W/ URETERAL STENT PLACEMENT Left 05/31/2015   Procedure: CYSTOSCOPY WITH STENT EXCHANGE;  Surgeon: Cleon Gustin, MD;  Location: [facility];  Service: Urology;   Laterality: Left;   CYSTOSCOPY WITH RETROGRADE PYELOGRAM, URETEROSCOPY AND STENT PLACEMENT Left 05/19/2015   Procedure: CYSTOSCOPY WITH RETROGRADE PYELOGRAM, URETEROSCOPY AND STENT PLACEMENT;  Surgeon: Cleon Gustin, MD;  Location: [facility];  Service: Urology;  Laterality: Left;   CYSTOSCOPY WITH RETROGRADE PYELOGRAM, URETEROSCOPY AND STENT PLACEMENT Left 02/14/2018   Procedure: CYSTOSCOPY WITH RETROGRADE PYELOGRAM, diagnostic URETEROSCOPY;  Surgeon: Alexis Frock, MD;  Location: [facility];  Service: Urology;  Laterality: Left;   CYSTOSCOPY/RETROGRADE/URETEROSCOPY/STONE EXTRACTION WITH BASKET Left 05/31/2015   Procedure: CYSTOSCOPY/RETROGRADE/STONE EXTRACTION WITH BASKET;  Surgeon: Cleon Gustin, MD;  Location: [facility];  Service: Urology;  Laterality: Left;   eyelid surgery Bilateral    Blephroplasty   REDUCTION MAMMAPLASTY Bilateral 2003   STONE EXTRACTION WITH BASKET Left 05/19/2015   Procedure: STONE EXTRACTION WITH BASKET;  Surgeon: Cleon Gustin, MD;  Location: [facility];  Service: Urology;  Laterality: Left;   TONSILLECTOMY     TOTAL HIP ARTHROPLASTY  08/2010   Right hip     Current Outpatient Medications  Medication Sig Dispense Refill   antiseptic oral rinse (BIOTENE) LIQD 15 mLs by Mouth Rinse route as needed for dry mouth.     Ascorbic Acid (VITAMIN C PO) Take by  mouth.     Calcium Carb-Cholecalciferol (CALCIUM 500 + D3 PO) Take by mouth.     calcium-vitamin D (OSCAL WITH D) 500-200 MG-UNIT tablet Take 1 tablet by mouth.     losartan (COZAAR) 50 MG tablet Take 50 mg by mouth daily.     metFORMIN (GLUCOPHAGE-XR) 500 MG 24 hr tablet Take 500 mg by mouth at bedtime.      metroNIDAZOLE (METROGEL) 0.75 % gel Apply 1 application topically 2 (two) times daily.     Misc. Devices MISC CPAP     Polyethyl Glycol-Propyl Glycol (SYSTANE OP) Apply to eye.     predniSONE (DELTASONE) 5 MG tablet Take 10 mg by mouth daily.     TURMERIC PO Take 1 tablet by mouth daily. (Patient not taking:  Reported on 10/19/2020)     Current Facility-Administered Medications  Medication Dose Route Frequency Provider Last Rate Last Admin   0.9 %  sodium chloride infusion  500 mL Intravenous Continuous Pyrtle, Lajuan Lines, MD        Allergies:   Aspirin, Benzocaine, Caffeine, Codeine, Lidocaine-epinephrine, Oxycodone-acetaminophen, Percocet [oxycodone-acetaminophen], Pravastatin, Procaine hcl, Pseudoephedrine hcl er, and Simvastatin    ROS:  Please see the history of present illness.   Otherwise, review of systems are positive for none.   All other systems are reviewed and negative.    PHYSICAL EXAM: VS:  There were no vitals taken for this visit. , BMI There is no height or weight on file to calculate BMI. GENERAL:  Well appearing NECK:  No jugular venous distention, waveform within normal limits, carotid upstroke brisk and symmetric, no bruits, no thyromegaly LUNGS:  Clear to auscultation bilaterally CHEST:  Unremarkable HEART:  PMI not displaced or sustained,S1 and S2 within normal limits, no S3, no S4, no clicks, no rubs, very soft systolic murmur heard only at the left upper sternal border and very brief, no diastolic murmurs ABD:  Flat, positive bowel sounds normal in frequency in pitch, no bruits, no rebound, no guarding, no midline pulsatile mass, no hepatomegaly, no splenomegaly EXT:  2 plus pulses throughout, no edema, no cyanosis no clubbing  EKG:  EKG is ordered today. The ekg ordered today demonstrates sinus rhythm, rate 65, axis within normal limits, intervals within normal limits, premature ectopic complex, nonspecific T wave changes.   Recent Labs: No results found for requested labs within last 8760 hours.    Lipid Panel    Component Value Date/Time   CHOL 285 (H) 01/10/2012 1124   TRIG 198.0 (H) 01/10/2012 1124   HDL 54.60 01/10/2012 1124   CHOLHDL 5 01/10/2012 1124   VLDL 39.6 01/10/2012 1124   LDLDIRECT 206.0 01/10/2012 1124      Wt Readings from Last 3 Encounters:   12/22/19 194 lb 9.6 oz (88.3 kg)  12/06/19 190 lb (86.2 kg)  09/07/19 192 lb (87.1 kg)      Other studies Reviewed: Additional studies/ records that were reviewed today include: Labs. Review of the above records demonstrates:  Please see elsewhere in the note.       ASSESSMENT AND PLAN:   Atrial septal defect/low secundum ASD: We will continue to manage this with conservatively.  No change in therapy.   Essential HTN: Her blood pressure is well controlled.  No change in therapy.    Hyperlipidemia:    Her LDL is not controlled but she is intolerant of statins.  We talked about a plant-based diet.     Bilateral carotid artery disease:   No further  testing is indicated.   Sleep apnea: She uses CPAP.  No change in therapy.  Palpitations:   She does have premature ectopic complexes but she is optically bothered by these.  She had normal electrolytes to evaluate this in the past.  No change in therapy.  Risk reduction: We talked about getting in a pool or water aerobics since she is not able to exercise.  Current medicines are reviewed at length with the patient today.  The patient does not have concerns regarding medicines.  The following changes have been made:  no change  Labs/ tests ordered today include: None No orders of the defined types were placed in this encounter.    Disposition:   FU with me in one year in     Signed, Minus Breeding, MD  10/25/2020 9:04 PM    [place]

## 2020-10-26 ENCOUNTER — Ambulatory Visit: Payer: Medicare Other | Admitting: Cardiology

## 2020-10-26 ENCOUNTER — Encounter: Payer: Self-pay | Admitting: Cardiology

## 2020-10-26 VITALS — BP 132/84 | HR 65 | Ht 65.0 in | Wt 190.0 lb

## 2020-10-26 DIAGNOSIS — G4730 Sleep apnea, unspecified: Secondary | ICD-10-CM | POA: Diagnosis not present

## 2020-10-26 DIAGNOSIS — I10 Essential (primary) hypertension: Secondary | ICD-10-CM | POA: Diagnosis not present

## 2020-10-26 DIAGNOSIS — R002 Palpitations: Secondary | ICD-10-CM

## 2020-10-26 DIAGNOSIS — Q211 Atrial septal defect: Secondary | ICD-10-CM

## 2020-10-26 DIAGNOSIS — Q2112 Patent foramen ovale: Secondary | ICD-10-CM

## 2020-10-26 NOTE — Patient Instructions (Addendum)

## 2020-10-27 ENCOUNTER — Other Ambulatory Visit: Payer: Self-pay

## 2020-10-27 ENCOUNTER — Ambulatory Visit: Payer: Medicare Other | Admitting: Physical Therapy

## 2020-10-27 DIAGNOSIS — M25512 Pain in left shoulder: Secondary | ICD-10-CM

## 2020-10-27 DIAGNOSIS — M25612 Stiffness of left shoulder, not elsewhere classified: Secondary | ICD-10-CM

## 2020-10-27 NOTE — Therapy (Signed)
[facility] [place], [place], [address] Phone: [phone]   Fax:  [phone]  Physical Therapy Treatment  Patient Details  Name: Julie Owen MRN: [id number] Date of Birth: [birth year] Referring Provider (PT): Leigh Aurora MD   Encounter Date: 10/27/2020   PT End of Session - 10/27/20 1340     Visit Number 3    Number of Visits 12    Date for PT Re-Evaluation 01/17/21    Authorization Type FOTO AT LEAST EVERY 5TH VISIT.  PROGRESS NOTE AT 10TH VISIT.  KX MODIFIER AFTER 15 VISITS.    PT Start Time [phone]    PT Stop Time 1041    PT Time Calculation (min) 54 min    Activity Tolerance Patient tolerated treatment well    Behavior During Therapy WFL for tasks assessed/performed             Past Medical History:  Diagnosis Date   Allergy    Anxiety    Atrial septal defect    Depression    Diabetes mellitus without complication (HCC)    Hyperlipidemia    Hypertension    Neuromuscular disorder (HCC)    neuropathy   Obesity    Osteoarthritis of lumbar spine    Osteoarthritis of thumb    bilateral   Prediabetes    Renal disorder    kidney stones   Sleep apnea    uses CPAP    Past Surgical History:  Procedure Laterality Date   BREAST REDUCTION SURGERY     BREAST SURGERY  2008   Right breast   BUNIONECTOMY  1993   Right foot   CATARACT EXTRACTION     CYSTOSCOPY W/ URETERAL STENT PLACEMENT Left 05/31/2015   Procedure: CYSTOSCOPY WITH STENT EXCHANGE;  Surgeon: Cleon Gustin, MD;  Location: [facility];  Service: Urology;  Laterality: Left;   CYSTOSCOPY WITH RETROGRADE PYELOGRAM, URETEROSCOPY AND STENT PLACEMENT Left 05/19/2015   Procedure: CYSTOSCOPY WITH RETROGRADE PYELOGRAM, URETEROSCOPY AND STENT PLACEMENT;  Surgeon: Cleon Gustin, MD;  Location: [facility];  Service: Urology;  Laterality: Left;   CYSTOSCOPY WITH RETROGRADE PYELOGRAM, URETEROSCOPY AND STENT PLACEMENT Left 02/14/2018   Procedure: CYSTOSCOPY  WITH RETROGRADE PYELOGRAM, diagnostic URETEROSCOPY;  Surgeon: Alexis Frock, MD;  Location: [facility];  Service: Urology;  Laterality: Left;   CYSTOSCOPY/RETROGRADE/URETEROSCOPY/STONE EXTRACTION WITH BASKET Left 05/31/2015   Procedure: CYSTOSCOPY/RETROGRADE/STONE EXTRACTION WITH BASKET;  Surgeon: Cleon Gustin, MD;  Location: [facility];  Service: Urology;  Laterality: Left;   eyelid surgery Bilateral    Blephroplasty   REDUCTION MAMMAPLASTY Bilateral 2003   STONE EXTRACTION WITH BASKET Left 05/19/2015   Procedure: STONE EXTRACTION WITH BASKET;  Surgeon: Cleon Gustin, MD;  Location: [facility];  Service: Urology;  Laterality: Left;   TONSILLECTOMY     TOTAL HIP ARTHROPLASTY  08/2010   Right hip    There were no vitals filed for this visit.   Subjective Assessment - 10/27/20 1342     Subjective COVID-19 screen performed prior to patient entering clinic.  Shoulder is moving better.    Pertinent History Polymyalgia Rheumatica, right THA, OA, DM, HTN.    Diagnostic tests MRI.    Patient Stated Goals use left UE without pain.    Currently in Pain? Yes    Pain Score 3     Pain Location Shoulder    Pain Descriptors / Indicators Sore    Pain Type Acute pain    Pain Onset More than a month ago  Pain Frequency Constant                               [facility] Adult PT Treatment/Exercise - 10/27/20 0001       Modalities   Modalities Electrical Stimulation;Moist Heat      Moist Heat Therapy   Number Minutes Moist Heat 20 Minutes    Moist Heat Location --   Left shoulder.     Acupuncturist Location Left shoulder.    Electrical Stimulation Action IFC at 80-150 Hz.    Electrical Stimulation Parameters 40% scan x 20 minutes.    Electrical Stimulation Goals Pain      Ultrasound   Ultrasound Location Left anterior shoulder.    Ultrasound Parameters Combo e'stim/US at 1.50 W/CM x 12 minutes.    Ultrasound Goals Pain      Manual Therapy    Manual Therapy Soft tissue mobilization    Soft tissue mobilization STW/M x 12 minutes to patient's left shoulder.                         PT Long Term Goals - 10/19/20 1247       PT LONG TERM GOAL #1   Title Independent with a HEP.    Time 6    Period Weeks    Status New      PT LONG TERM GOAL #2   Title Active left shoulder flexion to 145 degrees so the patient can easily reach overhead.    Time 6    Period Weeks    Status New      PT LONG TERM GOAL #3   Title Active ER to 70 degrees+ to allow for easily donning/doffing of apparel.    Time 6    Period Weeks    Status New      PT LONG TERM GOAL #4   Title Increase shoulder strength to a solid 4+/5 to increase stability for performance of functional activities.    Time 6    Period Weeks    Status New      PT LONG TERM GOAL #5   Title Perform ADL's with pain not > 3/10.    Time 6    Period Weeks    Status New                   Plan - 10/27/20 1349     Clinical Impression Statement Patient did very well with treatment today.  She notes a significant improvement in right shoulder movement.    Personal Factors and Comorbidities Comorbidity 1;Comorbidity 2;Other    Comorbidities Polymyalgia Rheumatica, right THA, OA, DM, HTN.    Examination-Activity Limitations Other;Reach Overhead    Examination-Participation Restrictions Other    Stability/Clinical Decision Making Evolving/Moderate complexity    Rehab Potential Good    PT Frequency 2x / week    PT Duration 6 weeks    PT Treatment/Interventions ADLs/Self Care Home Management;Cryotherapy;Electrical Stimulation;Ultrasound;Moist Heat;Iontophoresis 4mg /ml Dexamethasone;Therapeutic exercise;Therapeutic activities;Manual techniques;Passive range of motion;Vasopneumatic Device    PT Next Visit Plan Review HEP, PROM to patient's left shoulder, progress to pulleys, wall ladder and UE Ranger.  Modalities and STW/M as needed.  RW4 with yellow theraband.     Consulted and Agree with Plan of Care Patient             Patient will benefit from skilled therapeutic intervention in  order to improve the following deficits and impairments:     Visit Diagnosis: Acute pain of left shoulder  Stiffness of left shoulder, not elsewhere classified     Problem List Patient Active Problem List   Diagnosis Date Noted   PFO (patent foramen ovale) 10/25/2020   Kidney stone 12/06/2019   Sicca ([place]) 08/15/2016   OSA on CPAP 06/10/2014   Compliance with medication regimen 06/10/2014   Snorings 11/04/2013   Nocturia 11/04/2013   Insomnia 11/04/2013   Diabetes mellitus ([place]) 01/10/2012   Essential hypertension, benign 04/23/2010   ATRIAL SEPTAL DEFECT, HX OF 04/23/2010    Nyomi Howser, [place] MPT 10/27/2020, 1:55 PM  [facility] [address] [place], [place], [address] Phone: [phone]   Fax:  [phone]  Name: Julie Owen MRN: [id number] Date of Birth: Sep 21, 1945

## 2020-10-31 ENCOUNTER — Ambulatory Visit (HOSPITAL_COMMUNITY)
Admission: RE | Admit: 2020-10-31 | Discharge: 2020-10-31 | Disposition: A | Discharge status: Home / Self Care | Payer: Medicare Other | Source: Ambulatory Visit | Attending: Internal Medicine | Admitting: Internal Medicine

## 2020-10-31 ENCOUNTER — Other Ambulatory Visit: Payer: Self-pay

## 2020-10-31 DIAGNOSIS — R928 Other abnormal and inconclusive findings on diagnostic imaging of breast: Secondary | ICD-10-CM | POA: Diagnosis present

## 2020-11-03 ENCOUNTER — Ambulatory Visit: Payer: Medicare Other

## 2020-11-03 ENCOUNTER — Other Ambulatory Visit: Payer: Self-pay

## 2020-11-03 DIAGNOSIS — M25512 Pain in left shoulder: Secondary | ICD-10-CM

## 2020-11-03 DIAGNOSIS — M25612 Stiffness of left shoulder, not elsewhere classified: Secondary | ICD-10-CM

## 2020-11-03 NOTE — Therapy (Signed)
[facility] [place], [place], [address] Phone: [phone]   Fax:  [phone]  Physical Therapy Treatment  Patient Details  Name: Julie Owen MRN: [id number] Date of Birth: Nov 18, 1945 Referring Provider (PT): Leigh Aurora MD   Encounter Date: 11/03/2020   PT End of Session - 11/03/20 1040     Visit Number 4    Number of Visits 12    Date for PT Re-Evaluation 01/17/21    Authorization Type FOTO AT LEAST EVERY 5TH VISIT.  PROGRESS NOTE AT 10TH VISIT.  KX MODIFIER AFTER 15 VISITS.    PT Start Time 0945    PT Stop Time 1030    PT Time Calculation (min) 45 min    Activity Tolerance Patient tolerated treatment well    Behavior During Therapy WFL for tasks assessed/performed             Past Medical History:  Diagnosis Date   Allergy    Anxiety    Atrial septal defect    Depression    Diabetes mellitus without complication (HCC)    Hyperlipidemia    Hypertension    Neuromuscular disorder ([place])    neuropathy   Obesity    Osteoarthritis of lumbar spine    Osteoarthritis of thumb    bilateral   Prediabetes    Renal disorder    kidney stones   Sleep apnea    uses CPAP    Past Surgical History:  Procedure Laterality Date   BREAST REDUCTION SURGERY     BREAST SURGERY  2008   Right breast   BUNIONECTOMY  1993   Right foot   CATARACT EXTRACTION     CYSTOSCOPY W/ URETERAL STENT PLACEMENT Left 05/31/2015   Procedure: CYSTOSCOPY WITH STENT EXCHANGE;  Surgeon: Cleon Gustin, MD;  Location: [facility];  Service: Urology;  Laterality: Left;   CYSTOSCOPY WITH RETROGRADE PYELOGRAM, URETEROSCOPY AND STENT PLACEMENT Left 05/19/2015   Procedure: CYSTOSCOPY WITH RETROGRADE PYELOGRAM, URETEROSCOPY AND STENT PLACEMENT;  Surgeon: Cleon Gustin, MD;  Location: [facility];  Service: Urology;  Laterality: Left;   CYSTOSCOPY WITH RETROGRADE PYELOGRAM, URETEROSCOPY AND STENT PLACEMENT Left 02/14/2018   Procedure: CYSTOSCOPY  WITH RETROGRADE PYELOGRAM, diagnostic URETEROSCOPY;  Surgeon: Alexis Frock, MD;  [facility]: [facility];  Service: Urology;  Laterality: Left;   CYSTOSCOPY/RETROGRADE/URETEROSCOPY/STONE EXTRACTION WITH BASKET Left 05/31/2015   Procedure: CYSTOSCOPY/RETROGRADE/STONE EXTRACTION WITH BASKET;  Surgeon: Cleon Gustin, MD;  Location: [facility];  Service: Urology;  Laterality: Left;   eyelid surgery Bilateral    Blephroplasty   REDUCTION MAMMAPLASTY Bilateral 2003   STONE EXTRACTION WITH BASKET Left 05/19/2015   Procedure: STONE EXTRACTION WITH BASKET;  Surgeon: Cleon Gustin, MD;  Location: [facility];  Service: Urology;  Laterality: Left;   TONSILLECTOMY     TOTAL HIP ARTHROPLASTY  08/2010   Right hip    There were no vitals filed for this visit.   Subjective Assessment - 11/03/20 0953     Subjective COVID-19 screen performed prior to patient entering clinic.  Shoulder is moving better but has trouble using her arm to get into bed because of the Polmyalgia rheumatica                               [facility] Adult PT Treatment/Exercise - 11/03/20 0945       Posture/Postural Control   Posture/Postural Control Postural limitations    Postural Limitations Rounded Shoulders;Forward head  Exercises   Exercises Shoulder      Shoulder Exercises: Supine   Other Supine Exercises ER with cane assist x 15 with 10sec holds      Shoulder Exercises: Standing   Other Standing Exercises finger ladder x 10      Shoulder Exercises: Stretch   Other Shoulder Stretches low had placed for pec minor stretch 3 x 15 sec      Modalities   Modalities Electrical Stimulation;Moist Heat      Moist Heat Therapy   Number Minutes Moist Heat 20 Minutes    Moist Heat Location Shoulder      Electrical Stimulation   Electrical Stimulation Location Left shoulder.    Electrical Stimulation Parameters 40% scan x 20 min    Electrical Stimulation Goals Pain      Manual Therapy   Manual Therapy  Joint mobilization;Passive ROM    Joint Mobilization Supine: PROM ER, IR, Abduction, flexion  x 12 mins                         PT Long Term Goals - 10/19/20 1247       PT LONG TERM GOAL #1   Title Independent with a HEP.    Time 6    Period Weeks    Status New      PT LONG TERM GOAL #2   Title Active left shoulder flexion to 145 degrees so the patient can easily reach overhead.    Time 6    Period Weeks    Status New      PT LONG TERM GOAL #3   Title Active ER to 70 degrees+ to allow for easily donning/doffing of apparel.    Time 6    Period Weeks    Status New      PT LONG TERM GOAL #4   Title Increase shoulder strength to a solid 4+/5 to increase stability for performance of functional activities.    Time 6    Period Weeks    Status New      PT LONG TERM GOAL #5   Title Perform ADL's with pain not > 3/10.    Time 6    Period Weeks    Status New                   Plan - 11/03/20 1041     Clinical Impression Statement Patient with excellent participation this visit No increased symptoms. Continued limitation with ER at the shoulder but improving with ER stretch. Added pec minor stretch this visit.    Personal Factors and Comorbidities Comorbidity 1;Comorbidity 2;Other    Comorbidities Polymyalgia Rheumatica, right THA, OA, DM, HTN.    Examination-Activity Limitations Other;Reach Overhead    Examination-Participation Restrictions Other    Stability/Clinical Decision Making Evolving/Moderate complexity    Clinical Decision Making Low    Rehab Potential Good    PT Frequency 2x / week    PT Duration 6 weeks    PT Treatment/Interventions ADLs/Self Care Home Management;Cryotherapy;Electrical Stimulation;Ultrasound;Moist Heat;Iontophoresis 4mg /ml Dexamethasone;Therapeutic exercise;Therapeutic activities;Manual techniques;Passive range of motion;Vasopneumatic Device    PT Next Visit Plan Review HEP, PROM to patient's left shoulder, progress to  pulleys, wall ladder and UE Ranger.  Modalities and STW/M as needed.  RW4 with yellow theraband.             Patient will benefit from skilled therapeutic intervention in order to improve the following deficits and impairments:  Pain, Decreased  activity tolerance, Decreased strength, Decreased range of motion  Visit Diagnosis: Acute pain of left shoulder  Stiffness of left shoulder, not elsewhere classified     Problem List Patient Active Problem List   Diagnosis Date Noted   PFO (patent foramen ovale) 10/25/2020   Kidney stone 12/06/2019   Sicca ([place]) 08/15/2016   OSA on CPAP 06/10/2014   Compliance with medication regimen 06/10/2014   Snorings 11/04/2013   Nocturia 11/04/2013   Insomnia 11/04/2013   Diabetes mellitus ([place]) 01/10/2012   Essential hypertension, benign 04/23/2010   ATRIAL SEPTAL DEFECT, HX OF 04/23/2010    Marylou Mccoy PT, DPT 11/03/2020, 10:48 AM  [facility] [place], [place], [address] Phone: [phone]   Fax:  [phone]  Name: Julie Owen MRN: [id number] Date of Birth: 05-16-1945

## 2020-11-10 ENCOUNTER — Other Ambulatory Visit: Payer: Self-pay

## 2020-11-10 ENCOUNTER — Ambulatory Visit: Payer: Medicare Other | Attending: Internal Medicine | Admitting: *Deleted

## 2020-11-10 DIAGNOSIS — M25612 Stiffness of left shoulder, not elsewhere classified: Secondary | ICD-10-CM | POA: Diagnosis present

## 2020-11-10 DIAGNOSIS — M25512 Pain in left shoulder: Secondary | ICD-10-CM | POA: Diagnosis present

## 2020-11-10 NOTE — Therapy (Signed)
[facility] [place], [place], [address] Phone: [phone]   Fax:  [phone]  Physical Therapy Treatment  Patient Details  Name: Julie Owen MRN: [id number] Date of Birth: 11/23/1945 Referring Provider (PT): Leigh Aurora MD   Encounter Date: 11/10/2020   PT End of Session - 11/10/20 0954     Visit Number 5    Number of Visits 12    Date for PT Re-Evaluation 01/17/21    Authorization Type FOTO AT LEAST EVERY 5TH VISIT.  PROGRESS NOTE AT 10TH VISIT.  KX MODIFIER AFTER 15 VISITS.    PT Start Time 0945    PT Stop Time 0086    PT Time Calculation (min) 50 min             Past Medical History:  Diagnosis Date   Allergy    Anxiety    Atrial septal defect    Depression    Diabetes mellitus without complication (HCC)    Hyperlipidemia    Hypertension    Neuromuscular disorder (HCC)    neuropathy   Obesity    Osteoarthritis of lumbar spine    Osteoarthritis of thumb    bilateral   Prediabetes    Renal disorder    kidney stones   Sleep apnea    uses CPAP    Past Surgical History:  Procedure Laterality Date   BREAST REDUCTION SURGERY     BREAST SURGERY  2008   Right breast   BUNIONECTOMY  1993   Right foot   CATARACT EXTRACTION     CYSTOSCOPY W/ URETERAL STENT PLACEMENT Left 05/31/2015   Procedure: CYSTOSCOPY WITH STENT EXCHANGE;  Surgeon: Cleon Gustin, MD;  Location: [facility];  Service: Urology;  Laterality: Left;   CYSTOSCOPY WITH RETROGRADE PYELOGRAM, URETEROSCOPY AND STENT PLACEMENT Left 05/19/2015   Procedure: CYSTOSCOPY WITH RETROGRADE PYELOGRAM, URETEROSCOPY AND STENT PLACEMENT;  Surgeon: Cleon Gustin, MD;  Location: [facility];  Service: Urology;  Laterality: Left;   CYSTOSCOPY WITH RETROGRADE PYELOGRAM, URETEROSCOPY AND STENT PLACEMENT Left 02/14/2018   Procedure: CYSTOSCOPY WITH RETROGRADE PYELOGRAM, diagnostic URETEROSCOPY;  Surgeon: Alexis Frock, MD;  Location: [facility];  Service: Urology;   Laterality: Left;   CYSTOSCOPY/RETROGRADE/URETEROSCOPY/STONE EXTRACTION WITH BASKET Left 05/31/2015   Procedure: CYSTOSCOPY/RETROGRADE/STONE EXTRACTION WITH BASKET;  Surgeon: Cleon Gustin, MD;  Location: [facility];  Service: Urology;  Laterality: Left;   eyelid surgery Bilateral    Blephroplasty   REDUCTION MAMMAPLASTY Bilateral 2003   STONE EXTRACTION WITH BASKET Left 05/19/2015   Procedure: STONE EXTRACTION WITH BASKET;  Surgeon: Cleon Gustin, MD;  Location: [facility];  Service: Urology;  Laterality: Left;   TONSILLECTOMY     TOTAL HIP ARTHROPLASTY  08/2010   Right hip    There were no vitals filed for this visit.   Subjective Assessment - 11/10/20 0952     Subjective COVID-19 screen performed prior to patient entering clinic.  Shoulder is moving better with less pain.    Pertinent History Polymyalgia Rheumatica, right THA, OA, DM, HTN.    Diagnostic tests MRI.    Patient Stated Goals use left UE without pain.    Currently in Pain? Yes    Pain Score 3     Pain Location Shoulder    Pain Orientation Left    Pain Descriptors / Indicators Sore    Pain Type Acute pain    Pain Onset More than a month ago  [place] Adult PT Treatment/Exercise - 11/10/20 0001       Shoulder Exercises: Standing   Retraction AROM;10 reps   with depression   Other Standing Exercises finger ladder x 10    Other Standing Exercises Posture stretch with arms straight down with ER and scapular depression and retraction x 10 hold 5 secs      Shoulder Exercises: Pulleys   Flexion 3 minutes   cues for postur while performing     Modalities   Modalities Electrical Stimulation;Moist Heat      Moist Heat Therapy   Number Minutes Moist Heat 15 Minutes    Moist Heat Location Shoulder      Electrical Stimulation   Electrical Stimulation Location Left shoulder.    Electrical Stimulation Action IFC x 10 mins    Electrical Stimulation Parameters 80-150hz      Electrical Stimulation Goals Pain      Manual Therapy   Manual Therapy Passive ROM    Passive ROM PROM/ AAROM  for ER in scaption                         PT Long Term Goals - 10/19/20 1247       PT LONG TERM GOAL #1   Title Independent with a HEP.    Time 6    Period Weeks    Status New      PT LONG TERM GOAL #2   Title Active left shoulder flexion to 145 degrees so the patient can easily reach overhead.    Time 6    Period Weeks    Status New      PT LONG TERM GOAL #3   Title Active ER to 70 degrees+ to allow for easily donning/doffing of apparel.    Time 6    Period Weeks    Status New      PT LONG TERM GOAL #4   Title Increase shoulder strength to a solid 4+/5 to increase stability for performance of functional activities.    Time 6    Period Weeks    Status New      PT LONG TERM GOAL #5   Title Perform ADL's with pain not > 3/10.    Time 6    Period Weeks    Status New                   Plan - 11/10/20 1047     Clinical Impression Statement Pt arrived today doing better with decreased pain LT shldr. Rx focused on ROM and improved posture with scapular exs for retraction and depression. Normal modality response.    Personal Factors and Comorbidities Comorbidity 1;Comorbidity 2;Other    Comorbidities Polymyalgia Rheumatica, right THA, OA, DM, HTN.    Examination-Activity Limitations Other;Reach Overhead    Stability/Clinical Decision Making Evolving/Moderate complexity    Rehab Potential Good    PT Frequency 2x / week    PT Duration 6 weeks    PT Treatment/Interventions ADLs/Self Care Home Management;Cryotherapy;Electrical Stimulation;Ultrasound;Moist Heat;Iontophoresis 4mg /ml Dexamethasone;Therapeutic exercise;Therapeutic activities;Manual techniques;Passive range of motion;Vasopneumatic Device    PT Next Visit Plan Review HEP, PROM to patient's left shoulder, progress to pulleys, wall ladder and UE Ranger.  Modalities and STW/M as  needed.  RW4 with yellow theraband.    Consulted and Agree with Plan of Care Patient             Patient will benefit from skilled therapeutic intervention in order to  improve the following deficits and impairments:  Pain, Decreased activity tolerance, Decreased strength, Decreased range of motion  Visit Diagnosis: Acute pain of left shoulder  Stiffness of left shoulder, not elsewhere classified     Problem List Patient Active Problem List   Diagnosis Date Noted   PFO (patent foramen ovale) 10/25/2020   Kidney stone 12/06/2019   Sicca ([place]) 08/15/2016   OSA on CPAP 06/10/2014   Compliance with medication regimen 06/10/2014   Snorings 11/04/2013   Nocturia 11/04/2013   Insomnia 11/04/2013   Diabetes mellitus ([place]) 01/10/2012   Essential hypertension, benign 04/23/2010   ATRIAL SEPTAL DEFECT, HX OF 04/23/2010    Fatih Stalvey,CHRIS, PTA 11/10/2020, 10:53 AM  [facility] [address] [place], [place], [address] Phone: [phone]   Fax:  [phone]  Name: Julie Owen MRN: [id number] Date of Birth: 12/14/1945

## 2020-11-17 ENCOUNTER — Other Ambulatory Visit: Payer: Self-pay

## 2020-11-17 ENCOUNTER — Ambulatory Visit: Payer: Medicare Other

## 2020-11-17 DIAGNOSIS — M25612 Stiffness of left shoulder, not elsewhere classified: Secondary | ICD-10-CM

## 2020-11-17 DIAGNOSIS — M25512 Pain in left shoulder: Secondary | ICD-10-CM

## 2020-11-17 NOTE — Therapy (Addendum)
[facility] [place], [place], [address] Phone: [phone]   Fax:  [phone]  Physical Therapy Treatment  Patient Details  Name: Julie Owen MRN: [id number] Date of Birth: Feb 04, 1946 Referring Provider (PT): Leigh Aurora MD   Encounter Date: 11/17/2020   PT End of Session - 11/17/20 0957     Visit Number 6    Number of Visits 12    Date for PT Re-Evaluation 01/17/21    Authorization Type FOTO AT LEAST EVERY 5TH VISIT.  PROGRESS NOTE AT 10TH VISIT.  KX MODIFIER AFTER 15 VISITS.    PT Start Time 0945    PT Stop Time 1030    PT Time Calculation (min) 45 min             Past Medical History:  Diagnosis Date   Allergy    Anxiety    Atrial septal defect    Depression    Diabetes mellitus without complication (HCC)    Hyperlipidemia    Hypertension    Neuromuscular disorder (HCC)    neuropathy   Obesity    Osteoarthritis of lumbar spine    Osteoarthritis of thumb    bilateral   Prediabetes    Renal disorder    kidney stones   Sleep apnea    uses CPAP    Past Surgical History:  Procedure Laterality Date   BREAST REDUCTION SURGERY     BREAST SURGERY  2008   Right breast   BUNIONECTOMY  1993   Right foot   CATARACT EXTRACTION     CYSTOSCOPY W/ URETERAL STENT PLACEMENT Left 05/31/2015   Procedure: CYSTOSCOPY WITH STENT EXCHANGE;  Surgeon: Cleon Gustin, MD;  Location: [facility];  Service: Urology;  Laterality: Left;   CYSTOSCOPY WITH RETROGRADE PYELOGRAM, URETEROSCOPY AND STENT PLACEMENT Left 05/19/2015   Procedure: CYSTOSCOPY WITH RETROGRADE PYELOGRAM, URETEROSCOPY AND STENT PLACEMENT;  Surgeon: Cleon Gustin, MD;  Location: [facility];  Service: Urology;  Laterality: Left;   CYSTOSCOPY WITH RETROGRADE PYELOGRAM, URETEROSCOPY AND STENT PLACEMENT Left 02/14/2018   Procedure: CYSTOSCOPY WITH RETROGRADE PYELOGRAM, diagnostic URETEROSCOPY;  Surgeon: Alexis Frock, MD;  Location: [facility];  Service: Urology;   Laterality: Left;   CYSTOSCOPY/RETROGRADE/URETEROSCOPY/STONE EXTRACTION WITH BASKET Left 05/31/2015   Procedure: CYSTOSCOPY/RETROGRADE/STONE EXTRACTION WITH BASKET;  Surgeon: Cleon Gustin, MD;  Location: [facility];  Service: Urology;  Laterality: Left;   eyelid surgery Bilateral    Blephroplasty   REDUCTION MAMMAPLASTY Bilateral 2003   STONE EXTRACTION WITH BASKET Left 05/19/2015   Procedure: STONE EXTRACTION WITH BASKET;  Surgeon: Cleon Gustin, MD;  Location: [facility];  Service: Urology;  Laterality: Left;   TONSILLECTOMY     TOTAL HIP ARTHROPLASTY  08/2010   Right hip    There were no vitals filed for this visit.   Subjective Assessment - 11/17/20 0954     Subjective COVID-19 screen performed prior to patient entering clinic.  She states that she feels a little better but she is not sure how much of it is pain from shoulder versus polymyalsia rheumatica.    Pertinent History Polymyalgia Rheumatica, right THA, OA, DM, HTN.    Patient Stated Goals use left UE without pain.    Currently in Pain? No/denies                               [facility] Adult PT Treatment/Exercise - 11/17/20 0945  Exercises   Exercises Shoulder      Shoulder Exercises: Standing   Protraction Limitations Physioball hugs for scapular retraction    External Rotation Limitations with UE ranger ER x 2 mins    Extension Strengthening;10 reps    Retraction 10 reps;Strengthening    Retraction Weight (lbs) physioball squeeze    Other Standing Exercises finger ladder x 2 mins    Other Standing Exercises UE Ranger x 2 mins with  shoulder circles at the top      Shoulder Exercises: Pulleys   Flexion 5 minutes      Shoulder Exercises: Stretch   Other Shoulder Stretches low hand placed for pec minor stretch 3 x 15 sec      Modalities   Modalities Electrical Stimulation;Moist Heat      Electrical Stimulation   Electrical Stimulation Location Left shoulder.    Electrical Stimulation  Action IFC 15 mins    Electrical Stimulation Parameters 60-100 hz    Electrical Stimulation Goals Pain                         PT Long Term Goals - 10/19/20 1247       PT LONG TERM GOAL #1   Title Independent with a HEP.    Time 6    Period Weeks    Status New      PT LONG TERM GOAL #2   Title Active left shoulder flexion to 145 degrees so the patient can easily reach overhead.    Time 6    Period Weeks    Status New      PT LONG TERM GOAL #3   Title Active ER to 70 degrees+ to allow for easily donning/doffing of apparel.    Time 6    Period Weeks    Status New      PT LONG TERM GOAL #4   Title Increase shoulder strength to a solid 4+/5 to increase stability for performance of functional activities.    Time 6    Period Weeks    Status New      PT LONG TERM GOAL #5   Title Perform ADL's with pain not > 3/10.    Time 6    Period Weeks    Status New                   Plan - 11/17/20 1301     Clinical Impression Statement Patient arrived to clinic with minimal irritability of symptoms. She demonstrates knowledge of continued HEP  and progression of scapular strengthening with rows and learned postural stretch recently provided.She responded normally to modalites. Skilled PT recommended to continue addressing present limitations.    Personal Factors and Comorbidities Comorbidity 1;Comorbidity 2;Other    Comorbidities Polymyalgia Rheumatica, right THA, OA, DM, HTN.    Examination-Activity Limitations Other;Reach Overhead    Examination-Participation Restrictions Other    Stability/Clinical Decision Making Evolving/Moderate complexity    Clinical Decision Making Low    Rehab Potential Good    PT Frequency 2x / week    PT Duration 6 weeks    PT Treatment/Interventions ADLs/Self Care Home Management;Cryotherapy;Electrical Stimulation;Ultrasound;Moist Heat;Iontophoresis 4mg /ml Dexamethasone;Therapeutic exercise;Therapeutic activities;Manual  techniques;Passive range of motion;Vasopneumatic Device    PT Next Visit Plan Review HEP, PROM to patient's left shoulder, progress to pulleys, wall ladder and UE Ranger.  Modalities and STW/M as needed.  RW4 with yellow theraband.    Consulted and Agree with Plan of Care  Patient             Patient will benefit from skilled therapeutic intervention in order to improve the following deficits and impairments:  Pain, Decreased activity tolerance, Decreased strength, Decreased range of motion  Visit Diagnosis: Acute pain of left shoulder  Stiffness of left shoulder, not elsewhere classified     Problem List Patient Active Problem List   Diagnosis Date Noted   PFO (patent foramen ovale) 10/25/2020   Kidney stone 12/06/2019   Sicca ([place]) 08/15/2016   OSA on CPAP 06/10/2014   Compliance with medication regimen 06/10/2014   [place] 11/04/2013   Nocturia 11/04/2013   Insomnia 11/04/2013   Diabetes mellitus ([place]) 01/10/2012   Essential hypertension, benign 04/23/2010   ATRIAL SEPTAL DEFECT, HX OF 04/23/2010    Marylou Mccoy PT, DPT 11/17/2020, 1:04 PM  [facility] [place], [place], [address] Phone: [phone]   Fax:  [phone]  Name: NOAMI BOVE MRN: [id number] Date of Birth: 1945/12/07  PHYSICAL THERAPY DISCHARGE SUMMARY  Visits from Start of Care: 6.  Current functional level related to goals / functional outcomes: See above.   Remaining deficits: See below.   Education / Equipment: HEP.   Patient agrees to discharge. Patient goals were not met. Patient is being discharged due to not returning since the last visit.     Mali Applegate MPT

## 2020-11-28 ENCOUNTER — Ambulatory Visit (HOSPITAL_COMMUNITY)
Admission: RE | Admit: 2020-11-28 | Discharge: 2020-11-28 | Disposition: A | Discharge status: Home / Self Care | Payer: Medicare Other | Source: Ambulatory Visit | Attending: Urology | Admitting: Urology

## 2020-11-28 ENCOUNTER — Other Ambulatory Visit: Payer: Self-pay

## 2020-11-28 DIAGNOSIS — N200 Calculus of kidney: Secondary | ICD-10-CM | POA: Diagnosis present

## 2020-12-01 ENCOUNTER — Ambulatory Visit: Payer: Medicare Other | Admitting: Physical Therapy

## 2020-12-06 ENCOUNTER — Ambulatory Visit: Payer: Medicare Other | Admitting: Urology

## 2020-12-06 ENCOUNTER — Encounter: Payer: Self-pay | Admitting: Urology

## 2020-12-06 ENCOUNTER — Other Ambulatory Visit: Payer: Self-pay

## 2020-12-06 VITALS — BP 113/69 | HR 69

## 2020-12-06 DIAGNOSIS — N200 Calculus of kidney: Secondary | ICD-10-CM

## 2020-12-06 LAB — URINALYSIS, ROUTINE W REFLEX MICROSCOPIC
Bilirubin, UA: NEGATIVE
Glucose, UA: NEGATIVE
Ketones, UA: NEGATIVE
Nitrite, UA: NEGATIVE
Protein,UA: NEGATIVE
Specific Gravity, UA: 1.02 (ref 1.005–1.030)
Urobilinogen, Ur: 0.2 mg/dL (ref 0.2–1.0)
pH, UA: 6 (ref 5.0–7.5)

## 2020-12-06 LAB — MICROSCOPIC EXAMINATION
Renal Epithel, UA: NONE SEEN /hpf
WBC, UA: >30 /hpf — AB (ref 0–5)

## 2020-12-06 NOTE — Patient Instructions (Signed)
Textbook of Natural Medicine (5th ed., pp. 1518-1527.e3). [place], [place]: Elsevier.">  Dietary Guidelines to Help Prevent Kidney Stones Kidney stones are deposits of minerals and salts that form inside your kidneys. Your risk of developing kidney stones may be greater depending on your diet, your lifestyle, the medicines you take, and whether you have certain medical conditions. Most people can lower their chances of developing kidney stones by following the instructions below. Your dietitian may give you more specific instructions depending on your overall health and the type of kidney stones youtend to develop. What are tips for following this plan? Reading food labels  Choose foods with "no salt added" or "low-salt" labels. Limit your salt (sodium) intake to less than 1,500 mg a day. Choose foods with calcium for each meal and snack. Try to eat about 300 mg of calcium at each meal. Foods that contain 200-500 mg of calcium a serving include: 8 oz (237 mL) of milk, calcium-fortifiednon-dairy milk, and calcium-fortifiedfruit juice. Calcium-fortified means that calcium has been added to these drinks. 8 oz (237 mL) of kefir, yogurt, and soy yogurt. 4 oz (114 g) of tofu. 1 oz (28 g) of cheese. 1 cup (150 g) of dried figs. 1 cup (91 g) of cooked broccoli. One 3 oz (85 g) can of sardines or mackerel. Most people need 1,000-1,500 mg of calcium a day. Talk to your dietitian abouthow much calcium is recommended for you. Shopping Buy plenty of fresh fruits and vegetables. Most people do not need to avoid fruits and vegetables, even if these foods contain nutrients that may contribute to kidney stones. When shopping for convenience foods, choose: Whole pieces of fruit. Pre-made salads with dressing on the side. Low-fat fruit and yogurt smoothies. Avoid buying frozen meals or prepared deli foods. These can be high in sodium. Look for foods with live cultures, such as yogurt and kefir. Choose high-fiber  grains, such as whole-wheat breads, oat bran, and wheat cereals. Cooking Do not add salt to food when cooking. Place a salt shaker on the table and allow each person to add his or her own salt to taste. Use vegetable protein, such as beans, textured vegetable protein (TVP), or tofu, instead of meat in pasta, casseroles, and soups. Meal planning Eat less salt, if told by your dietitian. To do this: Avoid eating processed or pre-made food. Avoid eating fast food. Eat less animal protein, including cheese, meat, poultry, or fish, if told by your dietitian. To do this: Limit the number of times you have meat, poultry, fish, or cheese each week. Eat a diet free of meat at least 2 days a week. Eat only one serving each day of meat, poultry, fish, or seafood. When you prepare animal protein, cut pieces into small portion sizes. For most meat and fish, one serving is about the size of the palm of your hand. Eat at least five servings of fresh fruits and vegetables each day. To do this: Keep fruits and vegetables on hand for snacks. Eat one piece of fruit or a handful of berries with breakfast. Have a salad and fruit at lunch. Have two kinds of vegetables at dinner. Limit foods that are high in a substance called oxalate. These include: Spinach (cooked), rhubarb, beets, sweet potatoes, and Swiss chard. Peanuts. Potato chips, french fries, and baked potatoes with skin on. Nuts and nut products. Chocolate. If you regularly take a diuretic medicine, make sure to eat at least 1 or 2 servings of fruits or vegetables that are   high in potassium each day. These include: Avocado. Banana. Orange, prune, carrot, or tomato juice. Baked potato. Cabbage. Beans and split peas. Lifestyle  Drink enough fluid to keep your urine pale yellow. This is the most important thing you can do. Spread your fluid intake throughout the day. If you drink alcohol: Limit how much you use to: 0-1 drink a day for women who  are not pregnant. 0-2 drinks a day for men. Be aware of how much alcohol is in your drink. In the [place] one drink equals one 12 oz bottle of beer (355 mL), one 5 oz glass of wine (148 mL), or one 1 oz glass of hard liquor (44 mL). Lose weight if told by your health care provider. Work with your dietitian to find an eating plan and weight loss strategies that work best for you.  General information Talk to your health care provider and dietitian about taking daily supplements. You may be told the following depending on your health and the cause of your kidney stones: Not to take supplements with vitamin C. To take a calcium supplement. To take a daily probiotic supplement. To take other supplements such as magnesium, fish oil, or vitamin B6. Take over-the-counter and prescription medicines only as told by your health care provider. These include supplements. What foods should I limit? Limit your intake of the following foods, or eat them as told by your dietitian. Vegetables Spinach. Rhubarb. Beets. Canned vegetables. Pickles. Olives. Baked potatoeswith skin. Grains Wheat bran. Baked goods. Salted crackers. Cereals high in sugar. Meats and other proteins Nuts. Nut butters. Large portions of meat, poultry, or fish. Salted, precooked,or cured meats, such as sausages, meat loaves, and hot dogs. Dairy Cheese. Beverages Regular soft drinks. Regular vegetable juice. Seasonings and condiments Seasoning blends with salt. Salad dressings. Soy sauce. Ketchup. Barbecue sauce. Other foods Canned soups. Canned pasta sauce. Casseroles. Pizza. Lasagna. Frozen meals.Potato chips. French fries. The items listed above may not be a complete list of foods and beverages you should limit. Contact a dietitian for more information. What foods should I avoid? Talk to your dietitian about specific foods you should avoid based on the typeof kidney stones you have and your overall health. Fruits Grapefruit. The  item listed above may not be a complete list of foods and beverages you should avoid. Contact a dietitian for more information. Summary Kidney stones are deposits of minerals and salts that form inside your kidneys. You can lower your risk of kidney stones by making changes to your diet. The most important thing you can do is drink enough fluid. Drink enough fluid to keep your urine pale yellow. Talk to your dietitian about how much calcium you should have each day, and eat less salt and animal protein as told by your dietitian. This information is not intended to replace advice given to you by your health care provider. Make sure you discuss any questions you have with your healthcare provider. Document Revised: 04/22/2019 Document Reviewed: 04/22/2019 Elsevier Patient Education  2022 Elsevier Inc.  

## 2020-12-06 NOTE — Progress Notes (Signed)
Urological Symptom Review  Patient is experiencing the following symptoms: Frequent urination Hard to postpone urination Leakage of urine Weak stream Kidney stones   Review of Systems  Gastrointestinal (upper)  : Negative for upper GI symptoms  Gastrointestinal (lower) : Negative for lower GI symptoms  Constitutional : Fatigue  Skin: Skin rash/lesion Itching  Eyes: Blurred vision  Ear/Nose/Throat : Sinus problems  Hematologic/Lymphatic: Negative for Hematologic/Lymphatic symptoms  Cardiovascular : Negative for cardiovascular symptoms  Respiratory : Negative for respiratory symptoms  Endocrine: Negative for Endocrine symptoms  Musculoskeletal: Back pain Joint pain  Neurological: Negative for neurological symptoms  Psychologic: Negative for psychiatric symptoms

## 2020-12-06 NOTE — Progress Notes (Signed)
12/06/2020 1:30 PM   Julie Owen December 25, 1945 [id number]  Referring provider: Leanna Battles, MD [address] [place],  [place] [address]  Followup nephrolithiasis   HPI: Julie Owen is a 75yo here for followup for nephrolithiasis. No stone events since last visit. Renal US 7/19 showed no calculi. No other complaints. She was diagnosed in 10/2020 for a UTI.    PMH: Past Medical History:  Diagnosis Date   Allergy    Anxiety    Atrial septal defect    Depression    Diabetes mellitus without complication (HCC)    Hyperlipidemia    Hypertension    Neuromuscular disorder (HCC)    neuropathy   Obesity    Osteoarthritis of lumbar spine    Osteoarthritis of thumb    bilateral   Prediabetes    Renal disorder    kidney stones   Sleep apnea    uses CPAP    Surgical History: Past Surgical History:  Procedure Laterality Date   BREAST REDUCTION SURGERY     BREAST SURGERY  2008   Right breast   BUNIONECTOMY  1993   Right foot   CATARACT EXTRACTION     CYSTOSCOPY W/ URETERAL STENT PLACEMENT Left 05/31/2015   Procedure: CYSTOSCOPY WITH STENT EXCHANGE;  Surgeon: Cleon Gustin, MD;  Location: [facility];  Service: Urology;  Laterality: Left;   CYSTOSCOPY WITH RETROGRADE PYELOGRAM, URETEROSCOPY AND STENT PLACEMENT Left 05/19/2015   Procedure: CYSTOSCOPY WITH RETROGRADE PYELOGRAM, URETEROSCOPY AND STENT PLACEMENT;  Surgeon: Cleon Gustin, MD;  Location: [facility];  Service: Urology;  Laterality: Left;   CYSTOSCOPY WITH RETROGRADE PYELOGRAM, URETEROSCOPY AND STENT PLACEMENT Left 02/14/2018   Procedure: CYSTOSCOPY WITH RETROGRADE PYELOGRAM, diagnostic URETEROSCOPY;  Surgeon: Alexis Frock, MD;  Location: [facility];  Service: Urology;  Laterality: Left;   CYSTOSCOPY/RETROGRADE/URETEROSCOPY/STONE EXTRACTION WITH BASKET Left 05/31/2015   Procedure: CYSTOSCOPY/RETROGRADE/STONE EXTRACTION WITH BASKET;  Surgeon: Cleon Gustin, MD;  Location: [facility];  Service: Urology;  Laterality: Left;    eyelid surgery Bilateral    Blephroplasty   REDUCTION MAMMAPLASTY Bilateral 2003   STONE EXTRACTION WITH BASKET Left 05/19/2015   Procedure: STONE EXTRACTION WITH BASKET;  Surgeon: Cleon Gustin, MD;  Location: [facility];  Service: Urology;  Laterality: Left;   TONSILLECTOMY     TOTAL HIP ARTHROPLASTY  08/2010   Right hip    Home Medications:  Allergies as of 12/06/2020       Reactions   Aspirin Other (See Comments)   Abdominal pain   Benzocaine    Caffeine    Panic attacks   Codeine Swelling   Lidocaine-epinephrine Other (See Comments)   Oxycodone-acetaminophen Swelling   Percocet [oxycodone-acetaminophen] Swelling   Pravastatin    myalgia   Procaine Hcl    Pseudoephedrine Hcl Er Other (See Comments)   Jittery, nervousness   Simvastatin    REACTION: hair loss        Medication List        Accurate as of December 06, 2020  1:30 PM. If you have any questions, ask your nurse or doctor.          antiseptic oral rinse Liqd 15 mLs by Mouth Rinse route as needed for dry mouth.   CALCIUM 500 + D3 PO Take by mouth.   calcium-vitamin D 500-200 MG-UNIT tablet Commonly known as: OSCAL WITH D Take 1 tablet by mouth.   losartan 50 MG tablet Commonly known as: COZAAR Take 50 mg by mouth daily.   metFORMIN  500 MG 24 hr tablet Commonly known as: GLUCOPHAGE-XR Take 500 mg by mouth at bedtime.   metroNIDAZOLE 0.75 % gel Commonly known as: METROGEL Apply 1 application topically 2 (two) times daily.   Misc. Devices Misc CPAP   predniSONE 1 MG tablet Commonly known as: DELTASONE Take 2 mg by mouth daily.   SYSTANE OP Apply to eye.   TURMERIC PO Take 1 tablet by mouth daily.   VITAMIN C PO Take by mouth.        Allergies:  Allergies  Allergen Reactions   Aspirin Other (See Comments)    Abdominal pain   Benzocaine    Caffeine     Panic attacks   Codeine Swelling   Lidocaine-Epinephrine Other (See Comments)   Oxycodone-Acetaminophen Swelling    Percocet [Oxycodone-Acetaminophen] Swelling   Pravastatin     myalgia   Procaine Hcl    Pseudoephedrine Hcl Er Other (See Comments)    Jittery, nervousness   Simvastatin     REACTION: hair loss    Family History: Family History  Problem Relation Age of Onset   Hyperlipidemia Mother    Hypertension Mother    Heart disease Mother    Macular degeneration Father    Cancer Sister        colon   Hypertension Sister    Breast cancer Sister    Hypertension Other    Kidney disease Other    Stroke Other    Heart attack Other    Cancer Other    Obesity Other     Social History:  reports that she quit smoking about 45 years ago. Her smoking use included cigarettes. She started smoking about 55 years ago. She has a 5.00 pack-year smoking history. She has never used smokeless tobacco. She reports that she does not drink alcohol and does not use drugs.  ROS: All other review of systems were reviewed and are negative except what is noted above in HPI  Physical Exam: BP 113/69   Pulse 69   Constitutional:  Alert and oriented, No acute distress. HEENT: [place] AT, moist mucus membranes.  Trachea midline, no masses. Cardiovascular: No clubbing, cyanosis, or edema. Respiratory: Normal respiratory effort, no increased work of breathing. GI: Abdomen is soft, nontender, nondistended, no abdominal masses GU: No CVA tenderness.  Lymph: No cervical or inguinal lymphadenopathy. Skin: No rashes, bruises or suspicious lesions. Neurologic: Grossly intact, no focal deficits, moving all 4 extremities. Psychiatric: Normal mood and affect.  Laboratory Data: Lab Results  Component Value Date   WBC 9.4 05/18/2015   HGB 13.2 05/18/2015   HCT 40.8 05/18/2015   MCV 92.5 05/18/2015   PLT 255 05/18/2015    Lab Results  Component Value Date   CREATININE 0.69 03/10/2019    No results found for: PSA  No results found for: TESTOSTERONE  Lab Results  Component Value Date   HGBA1C 5.8 01/10/2012     Urinalysis    Component Value Date/Time   COLORURINE YELLOW 05/18/2015 1050   APPEARANCEUR Clear 12/06/2019 1350   LABSPEC 1.014 05/18/2015 1050   PHURINE 6.5 05/18/2015 1050   GLUCOSEU Negative 12/06/2019 1350   HGBUR LARGE (A) 05/18/2015 1050   BILIRUBINUR Negative 12/06/2019 1350   KETONESUR 15 (A) 05/18/2015 1050   PROTEINUR Negative 12/06/2019 1350   PROTEINUR NEGATIVE 05/18/2015 1050   UROBILINOGEN 0.2 04/09/2012 1800   NITRITE Negative 12/06/2019 1350   NITRITE NEGATIVE 05/18/2015 1050   LEUKOCYTESUR 1+ (A) 12/06/2019 1350    Lab Results  Component Value Date   LABMICR See below: 12/06/2019   WBCUA 11-30 (A) 12/06/2019   LABEPIT 0-10 12/06/2019   BACTERIA Moderate (A) 12/06/2019    Pertinent Imaging: Renal US 7/19: Images reviewed and discussed with the patient Results for orders placed during the hospital encounter of 05/13/15  DG Abd 1 View  Narrative CLINICAL DATA:  Left flank pain  EXAM: ABDOMEN - 1 VIEW  COMPARISON:  04/13/2012  FINDINGS: There are no disproportionally dilated loops of bowel. There is no obvious free intraperitoneal gas. Right total hip arthroplasty is stable.  IMPRESSION: Nonobstructive bowel gas pattern.   Electronically Signed By: Marybelle Killings M.D. On: 05/13/2015 14:43  No results found for this or any previous visit.  No results found for this or any previous visit.  No results found for this or any previous visit.  Results for orders placed during the hospital encounter of 11/28/20  Ultrasound renal complete  Narrative CLINICAL DATA:  75 year old female with kidney stone.  EXAM: RENAL / URINARY TRACT ULTRASOUND COMPLETE  COMPARISON:  None.  FINDINGS: Right Kidney:  Renal measurements: 10.0 x 4.7 x 5.0 cm = volume: 121 mL. Echogenicity within normal limits. No mass or hydronephrosis visualized.  Left Kidney:  Renal measurements: 11.3 x 5.5 x 4.7 cm = volume: 152 mL. Echogenicity within normal  limits. No mass or hydronephrosis visualized.  Bladder:  Appears normal for degree of bladder distention.  Other:  There is diffuse increased liver echogenicity most commonly seen in the setting of fatty infiltration. Superimposed inflammation or fibrosis is not excluded. Clinical correlation is recommended.  IMPRESSION: 1. Unremarkable kidneys and urinary bladder. 2. Fatty liver.   Electronically Signed By: Anner Crete M.D. On: 11/28/2020 22:05  No results found for this or any previous visit.  No results found for this or any previous visit.  Results for orders placed during the hospital encounter of 05/11/15  CT RENAL STONE STUDY  Narrative CLINICAL DATA:  75 year old female with left-sided flank pain.  EXAM: CT ABDOMEN AND PELVIS WITHOUT CONTRAST  TECHNIQUE: Multidetector CT imaging of the abdomen and pelvis was performed following the standard protocol without IV contrast.  COMPARISON:  CT dated 04/09/2012 and lumbar spine MRI dated 09/05/2011  FINDINGS: Evaluation of this exam is limited in the absence of intravenous contrast. Minimal bibasilar linear atelectasis/scarring. The visualized lung bases are otherwise clear. No intra-abdominal free air or free fluid.  Diffuse hepatic steatosis. An 8 mm ill-defined right hepatic hypodense lesion (series 2, image 34) is not well characterized on this noncontrast study but likely represents a cyst or hemangioma. CT with contrast or MRI is recommended for further evaluation. The gallbladder, pancreas, spleen, adrenal glands, right kidney and right ureter, and urinary bladder appear unremarkable. There is a 4 mm left ureteropelvic junction stone with mild left hydronephrosis. Hysterectomy.  There is sigmoid diverticulosis without active inflammatory changes. There is a 1.4 cm low attenuatin/fatty lesion in the distal small bowel in the right lower quadrant (series 2, image 60) which may represent a small  lipoma. There is no evidence of bowel obstruction. Normal appendix.  There is aortoiliac atherosclerotic disease. No portal venous gas identified. There is no adenopathy.  Small fat containing umbilical hernia. The abdominal wall soft tissues appear unremarkable. There is a right hip arthroplasty. There multilevel degenerative changes of the spine. No acute fractures.  IMPRESSION: A 4 mm left UPJ stone with mild left hydronephrosis.  Fatty liver. A subcentimeter right hepatic hypodense lesion, likely a hemangioma  or cyst. MRI may provide better characterization.   Electronically Signed By: Anner Crete M.D. On: 05/11/2015 23:45   Assessment & Plan:    1. Kidney stone -RTC 1 year with renal US - Urinalysis, Routine w reflex microscopic   No follow-ups on file.  Nicolette Bang, MD  [facility] [place]

## 2020-12-14 ENCOUNTER — Other Ambulatory Visit (HOSPITAL_COMMUNITY): Payer: Medicare Other

## 2020-12-20 ENCOUNTER — Encounter: Payer: Self-pay | Admitting: Family Medicine

## 2020-12-20 NOTE — Patient Instructions (Signed)
Please continue using your CPAP regularly. While your insurance requires that you use CPAP at least 4 hours each night on 70% of the nights, I recommend, that you not skip any nights and use it throughout the night if you can. Getting used to CPAP and staying with the treatment long term does take time and patience and discipline. Untreated obstructive sleep apnea when it is moderate to severe can have an adverse impact on cardiovascular health and raise her risk for heart disease, arrhythmias, hypertension, congestive heart failure, stroke and diabetes. Untreated obstructive sleep apnea causes sleep disruption, nonrestorative sleep, and sleep deprivation. This can have an impact on your day to day functioning and cause daytime sleepiness and impairment of cognitive function, memory loss, mood disturbance, and problems focussing. Using CPAP regularly can improve these symptoms.   We will order a new CPAP machine. I will have you use current machine until you get new one. We will follow up 31-90 days following set up of your new machine.

## 2020-12-20 NOTE — Progress Notes (Signed)
PATIENT: Julie Owen DOB: 28-Mar-1946  REASON FOR VISIT: follow up HISTORY FROM: patient  Chief Complaint  Patient presents with   Follow-up    RM 15. Last seen 12/22/2019. Here for cpap follow up. Last data 04/12 on resmed. Brought Cpap. Doing ok on this.  Still having issues w/ polymyalgia rheumatica.       HISTORY OF PRESENT ILLNESS: 12/21/2020 ALL:  Julie Owen returns for follow up for OSA on CPAP. She continues to do very well. She uses CPAP nightly and greater than 4 hours each night. She denies concerns with CPAP machine or supplies. She does request that we order a new CPAP as her current machine is 75 years old.   Compliance report dated 11/20/2020-12/20/2020 shows that she used CPAP 30/30 nights for greater than 4 hours. Average usage was 8 hours. Residual AHI was 0.6/hr on set pressure of 8cmH20. No leak noted.   12/22/2019 ALL:  Julie Owen is a 75 y.o. female here today for follow up for OSA on CPAP. She is doing well. She is using CPAP every night. She has noted hair loss/breakage over the past 4-5 weeks. She is uncertain of cause. She is seeing dermatology next week for an evaluation. She is weaning prednisone for polymyalgia rheumatica. She is followed by PCP regularly and reports normal labs. No new medications or stressors. She is using a Dreamwear Nasal pillow with headgear. She has used this mask for about 5 years.   Compliance report dated 11/21/2019 through 12/20/2019 reveals that she is used CPAP 30 of the past 30 days for compliance of 100%.  She has used CPAP greater than 4 hours 30 of the last 30 days for compliance of 100%.  Average usage is 8 hours and 36 minutes.  Residual AHI is 2.0 on 8 cm of water and an EPR of 2.  There is no significant leak noted.  HISTORY: (copied from my note on 12/21/2018)  Julie Owen is a 75 y.o. female here today for follow up of OSA on CPAP.  She is doing very well with CPAP at home.  She is using her machine nightly.  She does note  that she is resting better and has more energy during the day.  She began CPAP therapy in October 2015.  She is eligible for a new machine in October of this year.  She returns for evaluation.   Compliance report dated 11/17/2018 through 12/16/2018 reveals that she is using CPAP every night for compliance of 100%.  She is using CPAP greater than 4 hours every night.  Average usage is 9 hours and 10 minutes.  AHI was 0.9 on 8 cm of water and an EPR of 2.  There was no significant leak.   HISTORY: (copied from Dr Dohmeier's note on 12/18/2017)   Revisit date is 18 December 2017, I have the pleasure of seeing Julie Owen. Julie Owen today for a yearly revisit for evaluation of CPAP compliance in the treatment of obstructive sleep apnea.  Julie Owen is meanwhile 75 years old she continues to use her CPAP for his highest compliance, 100% for the last 30 days with an average use at time of 7 hours 51 minutes, CPAP is set at 8 cmH2O with 2 cm EPR and her residual AHI is 0.8/h.  She is using an air sense 10 AutoSet the air leaks were mild, and she has not been woken by snoring, gasping for air or feeling air hungry. She is  followed by [facility].    She endorsed today the fatigue severity score 27 and the Epworth sleepiness score at 5 out of 24 points, with a geriatric depression score endorsed at 11 out of 15. 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 He is demented and has CHF.  The stress made her sick, and her fathers cardiologist had refered him to hospice for respite care.   4-5 -2018. Julie Owen is a 75 y.o. female, who is seen here as a referral from Dr. Philip Aspen for an insomnia and snoring evaluation, ruling out apnea. Julie Owen describes today a very troubling development. But she had no difficulties getting adjusted to CPAP use and for the first year was a very compliant user she then developed a tendency to mouth breathe-  woke up with a parched dry "sticky " tongue, and now was placed in a full face mask with little improvement. Her sleep time has always been only 4-5 hours at night before CPAP was initiated but now her sleep time is even less. She endorsed the fatigue severity score today at 32 points, the Epworth sleepiness score at 8 and the geriatric depression score at a very high rate at 10 out of 15 Points. She uses a chin strip.  Her compliance has been excellent in spite of the troubles that 100% 5 hours 19 minutes each night ( but was 6-7 hours before) , 8 cm water pressure is currently used was 27 m EPR the residual AHI is 1.3. Her humidifier was not adjusted by the durable medical equipment company, she is currently followed by [facility].She had no medication adjustments, and we were not able to identify other factors that could have led to a dry mouth. She does have dry eyes now- to she reports that she has an air leak into the eyes which irritates the eyes. Systane gel at night, drops in daytime. I am looking for sicca symptoms and for a more comfortable mask. I will again place her on a nasasl pillow. Currently on an [employer].    Consultation, CD The patient is a retired Pharmacist, hospital, and still has some engrained sleep habits, such as rising at 5 AM as she did for over 40 years. She is not sleepy but fatigued, she exclaimed. She had related the changes in her sleep pattern to her menopause. She developed palpitations from caffeine in peri menopause, felt panicked and anxious. She used" to sleep like a baby", but at age 13 she found herself in completed  Menopause. She could finally relate hot flushes and mood changes, tearfulness. Her sleep never recovered. She gained weight and begun snoring. Her sleep has become less and less deep, restorative.  The patient reports she usually goes to bed around 9 PM watches TV in the bedroom. About one hour later she will go to sleep, but wakes up 2-3 times to  go to the bathroom. If she turns over she will wake up, too. She has sometimes trouble to go back to sleep.  She always wakes up at 2.30 and she finds no further restful sleep after that, rises at 5.30, spontaneously, no alarm needed. She often has a dry mouth when waking up. She does not report any nocturnal headaches usually. The overall nocturnal sleep time is between 4-5 hours only, she takes  One hour nap in the afternoon, about 2-3 PM,  and on her sofa. Her husband sleeps not in the same room, she snores and has woken herself up form snoring. Her dog sleeps nearby, but never wakes her. She has owned a dog before that needed physically to be brought outside to urinate , 7-8 times at night. This dog died about 2 years ago.  She doesn't take any caffeine anymore. The sleep pattern may have changed than.  Family history - one cousin with OSA , non compliant  CPAP . Reviewed medication and alLergies,  HISTORY    06-10-14 Julie Owen is here today for her first visit after sleep study and CPAP titration. The patient underwent a polysomnography on 12-17-13 which revealed a mild apnea with an AHI of 10.5 and an RDI of 14.6 per hour but associated with oxygen desaturations. In the setting of snoring and periodic limb movements a CPAP titration was not performed the same night as the patient did not have enough sleep time at line. She returned on 02-18-14 for the CPAP titration,  her AHI was reduced to 1.4,  periodic limb movements were  rare - and she did best at 8 cm water pressure under which she slept 492 minutes.    PLAN : [facility] to exchange the machine, ASAP. RV in 3 month with NP for follow up.  The patient still has some nasal congestion which may contribute to her difficulties breathing at night at least on occasion. I recommend a saltwater nasal spray to just literally flush the nostril. I have generated an electronic order to apnea to make sure that the patients machine will be exchanged for another one  ASAP. We do not have to change the mask, Interface, Tubing or filter at this time.   Interval history from 08-15-14  Julie Owen has seen  benefit of a new CPAP machine. She had undergone an overnight pulse oximetry by using CPAP. But had downloaded data spoke for a very well controlled sleep apnea with an residual AHI file below 5 her fatigue severity was very high and she was excessively daytime sleepy as well.  It turned out that she remained hypoxemic and that a new machine was needed to control her sleep apnea as well as her oxygen levels. Her durable medical equipment company, namely a [employer], exchanged her old not longer working machine for a new model which now is doing well. Her fatigue severity score today for further is 19 and her Epworth sleepiness score is 4 points. I was also able to get a new date download. The patient had been diagnosed with sleep apnea seems to do well on the current setting of 8 cm water. She continues to use an Eason nasal mask in medium size. Heated humidification his knee is used. The AHI on 06-10-14 was 0.4 , at  a 97% compliance.The patient reports problems with [facility], being billed several hundred USD a month for a rental machine- and having not filed for insurance coverage ? A broken machine needs to be replaced and is an insurance covered expense.    2017 , RV Julie Owen is now on her third CPAP machine and received her most recent in December 2016.  She is meanwhile 75 years of age she is using her CPAP with excellent compliance 100% of days and 100% of those days over 4 hours, with an average hourly use of 7 hours and 46 minutes and the set pressure of 8 cm water this 2 cm expiratory pressure relief.  Her residual AHI is 0.7,  which speaks for an excellent resolution. The 95th percentile airleak is close to 8 L/m which is a minimal air leak.   REVIEW OF SYSTEMS: Out of a complete 14 system review of symptoms, the patient complains only of the following symptoms,  muscle aches, hair loss, snoring and all other reviewed systems are negative.  FSS: 47 ESS: 5  ALLERGIES: Allergies  Allergen Reactions   Aspirin Other (See Comments)    Abdominal pain   Benzocaine    Caffeine     Panic attacks   Codeine Swelling   Lidocaine-Epinephrine Other (See Comments)   Oxycodone-Acetaminophen Swelling   Percocet [Oxycodone-Acetaminophen] Swelling   Pravastatin     myalgia   Procaine Hcl    Pseudoephedrine Hcl Er Other (See Comments)    Jittery, nervousness   Simvastatin     REACTION: hair loss    HOME MEDICATIONS: Outpatient Medications Prior to Visit  Medication Sig Dispense Refill   antiseptic oral rinse (BIOTENE) LIQD 15 mLs by Mouth Rinse route as needed for dry mouth.     Ascorbic Acid (VITAMIN C PO) Take by mouth.     Calcium Carb-Cholecalciferol (CALCIUM 500 + D3 PO) Take by mouth.     calcium-vitamin D (OSCAL WITH D) 500-200 MG-UNIT tablet Take 1 tablet by mouth.     losartan (COZAAR) 50 MG tablet Take 50 mg by mouth daily.     metFORMIN (GLUCOPHAGE-XR) 500 MG 24 hr tablet Take 500 mg by mouth at bedtime.      metroNIDAZOLE (METROGEL) 0.75 % gel Apply 1 application topically 2 (two) times daily.     Misc. Devices MISC CPAP     Polyethyl Glycol-Propyl Glycol (SYSTANE OP) Apply to eye.     predniSONE (DELTASONE) 1 MG tablet Take 2 mg by mouth daily.     TURMERIC PO Take 1 tablet by mouth daily.     Facility-Administered Medications Prior to Visit  Medication Dose Route Frequency Provider Last Rate Last Admin   0.9 %  sodium chloride infusion  500 mL Intravenous Continuous Pyrtle, Lajuan Lines, MD        PAST MEDICAL HISTORY: Past Medical History:  Diagnosis Date   Allergy    Anxiety    Atrial septal defect    Depression    Diabetes mellitus without complication (HCC)    Hyperlipidemia    Hypertension    Neuromuscular disorder ([place])    neuropathy   Obesity    Osteoarthritis of lumbar spine    Osteoarthritis of thumb    bilateral    Prediabetes    Renal disorder    kidney stones   Sleep apnea    uses CPAP    PAST SURGICAL HISTORY: Past Surgical History:  Procedure Laterality Date   BREAST REDUCTION SURGERY     BREAST SURGERY  2008   Right breast   BUNIONECTOMY  1993   Right foot   CATARACT EXTRACTION     CYSTOSCOPY W/ URETERAL STENT PLACEMENT Left 05/31/2015   Procedure: CYSTOSCOPY WITH STENT EXCHANGE;  Surgeon: Cleon Gustin, MD;  Location: [facility];  Service: Urology;  Laterality: Left;   CYSTOSCOPY WITH RETROGRADE PYELOGRAM, URETEROSCOPY AND STENT PLACEMENT Left 05/19/2015   Procedure: CYSTOSCOPY WITH RETROGRADE PYELOGRAM, URETEROSCOPY AND STENT PLACEMENT;  Surgeon: Cleon Gustin, MD;  Location: [facility];  Service: Urology;  Laterality: Left;   CYSTOSCOPY WITH RETROGRADE PYELOGRAM, URETEROSCOPY AND STENT PLACEMENT Left 02/14/2018   Procedure: CYSTOSCOPY WITH RETROGRADE PYELOGRAM, diagnostic URETEROSCOPY;  Surgeon: Alexis Frock, MD;  Location: [facility];  Service: Urology;  Laterality: Left;   CYSTOSCOPY/RETROGRADE/URETEROSCOPY/STONE EXTRACTION WITH BASKET Left 05/31/2015   Procedure: CYSTOSCOPY/RETROGRADE/STONE EXTRACTION WITH BASKET;  Surgeon: Cleon Gustin, MD;  Location: [facility];  Service: Urology;  Laterality: Left;   eyelid surgery Bilateral    Blephroplasty   REDUCTION MAMMAPLASTY Bilateral 2003   STONE EXTRACTION WITH BASKET Left 05/19/2015   Procedure: STONE EXTRACTION WITH BASKET;  Surgeon: Cleon Gustin, MD;  Location: [facility];  Service: Urology;  Laterality: Left;   TONSILLECTOMY     TOTAL HIP ARTHROPLASTY  08/2010   Right hip    FAMILY HISTORY: Family History  Problem Relation Age of Onset   Hyperlipidemia Mother    Hypertension Mother    Heart disease Mother    Macular degeneration Father    Cancer Sister        colon   Hypertension Sister    Breast cancer Sister    Hypertension Other    Kidney disease Other    Stroke Other    Heart attack Other    Cancer Other    Obesity  Other     SOCIAL HISTORY: Social History   Socioeconomic History   Marital status: Married    Spouse name: Gwyndolyn Saxon   Number of children: 0   Years of education: Master's   Highest education level: Not on file  Occupational History   Not on file  Tobacco Use   Smoking status: Former    Packs/day: 0.50    Years: 10.00    Pack years: 5.00    Types: Cigarettes    Start date: 12/11/1965    Quit date: 07/30/1975    Years since quitting: 45.4   Smokeless tobacco: Never  Substance and Sexual Activity   Alcohol use: No    Alcohol/week: 0.0 standard drinks   Drug use: No   Sexual activity: Not on file  Other Topics Concern   Not on file  Social History Narrative   Patient is married Gwyndolyn Saxon) and lives at home with her husband.   Patient is a retired Pharmacist, hospital.   Patient has a Master's degree.   Patient is right-handed.   Patient does not drink any caffeine.   Social Determinants of Health   Financial Resource Strain: Not on file  Food Insecurity: Not on file  Transportation Needs: Not on file  Physical Activity: Not on file  Stress: Not on file  Social Connections: Not on file  Intimate Partner Violence: Not on file      PHYSICAL EXAM  Vitals:   12/21/20 1042  BP: 134/74  Pulse: 60  Weight: 186 lb 12.8 oz (84.7 kg)  Height: '5\' 5"'$  (1.651 m)    Body mass index is 31.09 kg/m.  Generalized: Well developed, in no acute distress  Cardiology: normal rate and rhythm, no murmur noted Respiratory: clear to auscultation bilaterally  Neurological examination  Mentation: Alert oriented to time, place, history taking. Follows all commands speech and language fluent Cranial nerve II-XII: Pupils were equal round reactive to light. Extraocular movements were full, visual field were full on confrontational test. Facial sensation and strength were normal. Head turning and shoulder shrug  were normal and symmetric. Motor: The motor testing reveals 5 over 5 strength of all 4  extremities. Good symmetric motor tone is noted throughout.  Gait and station: Gait is normal.    DIAGNOSTIC DATA (LABS, IMAGING, TESTING) - I reviewed patient records, labs, notes, testing and imaging myself where available.  No flowsheet  data found.   Lab Results  Component Value Date   WBC 9.4 05/18/2015   HGB 13.2 05/18/2015   HCT 40.8 05/18/2015   MCV 92.5 05/18/2015   PLT 255 05/18/2015      Component Value Date/Time   NA 140 03/10/2019 1120   K 4.7 03/10/2019 1120   CL 104 03/10/2019 1120   CO2 28 03/10/2019 1120   GLUCOSE 115 (H) 03/10/2019 1120   BUN 18 03/10/2019 1120   CREATININE 0.69 03/10/2019 1120   CALCIUM 8.7 (L) 03/10/2019 1120   PROT 6.9 05/11/2015 2332   ALBUMIN 4.1 05/11/2015 2332   AST 42 (H) 05/11/2015 2332   ALT 43 05/11/2015 2332   ALKPHOS 52 05/11/2015 2332   BILITOT 0.8 05/11/2015 2332   GFRNONAA >60 03/10/2019 1120   GFRAA >60 03/10/2019 1120   Lab Results  Component Value Date   CHOL 285 (H) 01/10/2012   HDL 54.60 01/10/2012   LDLDIRECT 206.0 01/10/2012   TRIG 198.0 (H) 01/10/2012   CHOLHDL 5 01/10/2012   Lab Results  Component Value Date   HGBA1C 5.8 01/10/2012   No results found for: [id number] Lab Results  Component Value Date   TSH 1.503 03/10/2019     ASSESSMENT AND PLAN 75 y.o. year old female  has a past medical history of Allergy, Anxiety, Atrial septal defect, Depression, Diabetes mellitus without complication ([place]), Hyperlipidemia, Hypertension, Neuromuscular disorder ([place]), Obesity, Osteoarthritis of lumbar spine, Osteoarthritis of thumb, Prediabetes, Renal disorder, and Sleep apnea. here with      ICD-10-CM   1. OSA on CPAP  G47.33 For home use only DME continuous positive airway pressure (CPAP)   Z99.89        Srisha is doing great from a CPAP perspective. Compliance report reveals excellent compliance. She was encouraged to continue using CPAP nightly and greater than 4 hours each night. I will send an order to  DME for a new CPAP machine as her machine is greater than 99 years old. She denies any concerns with her current CPAP. She will follow up 31-90 days following set up of new machine. She verbalizes understanding and agreement with this plan.    Orders Placed This Encounter  Procedures   For home use only DME continuous positive airway pressure (CPAP)    New CPAP machine with set pressure of 8cmH20. EPR 2.    Order Specific Question:   Length of Need    Answer:   Lifetime    Order Specific Question:   Patient has OSA or probable OSA    Answer:   Yes    Order Specific Question:   Is the patient currently using CPAP in the home    Answer:   Yes    Order Specific Question:   Settings    Answer:   Other see comments    Order Specific Question:   CPAP supplies needed    Answer:   Mask, headgear, cushions, filters, heated tubing and water chamber      No orders of the defined types were placed in this encounter.      Debbora Presto, FNP-C 12/21/2020, 11:47 AM [facility] [address] [place], [place] [address] [phone]

## 2020-12-21 ENCOUNTER — Encounter: Payer: Self-pay | Admitting: Family Medicine

## 2020-12-21 ENCOUNTER — Ambulatory Visit: Payer: Medicare Other | Admitting: Family Medicine

## 2020-12-21 VITALS — BP 134/74 | HR 60 | Ht 65.0 in | Wt 186.8 lb

## 2020-12-21 DIAGNOSIS — G4733 Obstructive sleep apnea (adult) (pediatric): Secondary | ICD-10-CM | POA: Diagnosis not present

## 2020-12-21 DIAGNOSIS — Z9989 Dependence on other enabling machines and devices: Secondary | ICD-10-CM | POA: Diagnosis not present

## 2021-01-16 NOTE — Progress Notes (Signed)
Results mailed 

## 2021-06-06 ENCOUNTER — Telehealth: Payer: Self-pay

## 2021-06-06 NOTE — Telephone Encounter (Signed)
Called to schedule pt for her initial CPAP f/u. I have blocked 3/28 @9am  for her. If pt calls back please provide time slot. If time does not work please schedule pt between 07/01/21-08/28/21 for insurance compliance.

## 2021-06-07 NOTE — Telephone Encounter (Signed)
Called pt and pt accepted the appt that was put on hold for them. Pt was informed to bring cpap machine and power cord to the appt with her. Pt verbalized understanding.

## 2021-08-07 ENCOUNTER — Ambulatory Visit: Payer: Medicare Other | Admitting: Family Medicine

## 2021-08-20 ENCOUNTER — Telehealth: Payer: Self-pay | Admitting: Family Medicine

## 2021-08-20 NOTE — Telephone Encounter (Signed)
Called pt. Rescheduled appt for 08/21/21 at 8:30am with Dr. Brett Fairy. Cx 08/23/21 appt. ?

## 2021-08-20 NOTE — Telephone Encounter (Signed)
Pt cannot make her appt on 08/23/21 due to scheduling conflict. Pt needs to be seen for initial CPAP 07/01/21-08/28/21 due to compliance date.   ?There are no available appointments with Dr. Beacher May or Amy Lomax before 07/01/21- 08/28/21.  ?Pt would like a call back to discuss a work in date.  ?

## 2021-08-21 ENCOUNTER — Encounter: Payer: Self-pay | Admitting: Neurology

## 2021-08-21 ENCOUNTER — Ambulatory Visit: Payer: Medicare Other | Admitting: Neurology

## 2021-08-21 VITALS — BP 141/76 | HR 77 | Ht 65.0 in | Wt 174.0 lb

## 2021-08-21 DIAGNOSIS — G4733 Obstructive sleep apnea (adult) (pediatric): Secondary | ICD-10-CM | POA: Diagnosis not present

## 2021-08-21 DIAGNOSIS — M353 Polymyalgia rheumatica: Secondary | ICD-10-CM

## 2021-08-21 DIAGNOSIS — Q2112 Patent foramen ovale: Secondary | ICD-10-CM

## 2021-08-21 DIAGNOSIS — Z9989 Dependence on other enabling machines and devices: Secondary | ICD-10-CM | POA: Diagnosis not present

## 2021-08-21 DIAGNOSIS — H16223 Keratoconjunctivitis sicca, not specified as Sjogren's, bilateral: Secondary | ICD-10-CM | POA: Diagnosis not present

## 2021-08-21 NOTE — Progress Notes (Signed)
[facility] ?SLEEP MEDICINE CONSULTATION  ? ?Provider:  Larey Seat, M D  ?Referring Provider: Donnajean Lopes, MD ?Primary Care Physician:  Donnajean Lopes, MD ? ?Chief Complaint  ?Patient presents with  ? Obstructive Sleep Apnea  ?  RM 10, alone. Here for initial CPAP f/u. Pt reports doing well on her new CPAP machine.   ? ? ?HPI:  RV on 08-21-2021- The patient is long time established and got an appointment for the 90 day RV after receiving a new ResMed machine. She considers herself not able to sleep without CPAP.  ? ?She was not retested by HST or [facility], but set up for a new machine directly? The new machine is also not using autotitration but is set at 8 cm water- 2 cm EPR- I am glad she got a ResMed machine.  Residual AHI is 0.4/h.  ?100% compliance.  ?DME is ? [facility]?  She was told to order supplies on the internet but she has no computer.  ? ? ? ? ?12/21/2020 ALL:  ?Julie Owen returns for follow up for OSA on CPAP. She continues to do very well. She uses CPAP nightly and greater than 4 hours each night. She denies concerns with CPAP machine or supplies. She does request that we order a new CPAP as her current machine is 76 years old.  ?  ?Compliance report dated 11/20/2020-12/20/2020 shows that she used CPAP 30/30 nights for greater than 4 hours. Average usage was 8 hours. Residual AHI was 0.6/hr on set pressure of 8cmH20. No leak noted.  ?  ?12/22/2019 ALL:  ?Julie Owen is a 76 y.o. female here today for follow up for OSA on CPAP. She is doing well. She is using CPAP every night. She has noted hair loss/breakage over the past 4-5 weeks. She is uncertain of cause. She is seeing dermatology next week for an evaluation. She is weaning prednisone for polymyalgia rheumatica. She is followed by PCP regularly and reports normal labs. No new medications or stressors. She is using a Dreamwear Nasal pillow with headgear. She has used this mask for about 5 years.  ? ? ? ? ? ?Revisit date is 18 December 2017, I have the pleasure of seeing Julie Owen today for a yearly revisit for evaluation of CPAP compliance in the treatment of obstructive sleep apnea.  Julie Owen is meanwhile 76 years old she continues to use her CPAP for his highest compliance, 100% for the last 30 days with an average use at time of 7 hours 51 minutes, CPAP is set at 8 cmH2O with 2 cm EPR and her residual AHI is 0.8/h.  She is using an air sense 10 AutoSet the air leaks were mild, and she has not been woken by snoring, gasping for air or feeling air hungry. She is followed by [facility].  ?  ?She endorsed today the fatigue severity score 27 and the Epworth sleepiness score at 5 out of 24 points, with a geriatric depression score endorsed at 11 out of 15. 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 He is demented and has CHF.  ?The stress made her sick, and her fathers cardiologist had refered him to hospice for respite care. ?  ? ? ? ? ?4-5 -2018. ? Julie Owen is a 77 y.o. female, who is seen here as a referral from Dr. Philip Aspen  for an insomnia and snoring evaluation, ruling out apnea. Julie Owen describes today a very troubling development. But she had no difficulties getting adjusted to CPAP use and for the first year was a very compliant user she then developed a tendency to mouth breathe- woke up with a parched dry "sticky " tongue, and now was placed in a full face mask with little improvement. Her sleep time has always been only 4-5 hours at night before CPAP was initiated but now her sleep time is even less. She endorsed the fatigue severity score today at 32 points, the Epworth sleepiness score at 8 and the geriatric depression score at a very high rate at 10 out of 15 Points. She uses a chin strip.  ?Her compliance has been excellent in spite of the troubles that 100% 5 hours 19 minutes each night ( but was 6-7 hours before) , 8 cm  water pressure is currently used was 27 m EPR the residual AHI is 1.3. Her humidifier was not adjusted by the durable medical equipment company, she is currently followed by [facility].She had no medication adjustments, and we were not able to identify other factors that could have led to a dry mouth. She does have dry eyes now- to she reports that she has an air leak into the eyes which irritates the eyes. Systane gel at night, drops in daytime. I am looking for sicca symptoms and for a more comfortable mask. I will again place her on a nasasl pillow. Currently on an [employer].  ? ? ? ?Consultation, CD ?The patient is a retired Pharmacist, hospital, and still has some engrained sleep habits, such as rising at 5 AM as she did for over 40 years. She is not sleepy but fatigued, she exclaimed. She had related the changes in her sleep pattern to her menopause. She developed palpitations from caffeine in peri menopause, felt panicked and anxious. She used" to sleep like a baby", but at age 89 she found herself in completed  Menopause. She could finally relate hot flushes and mood changes, tearfulness. Her sleep never recovered. She gained weight and begun snoring. Her sleep has become less and less deep, restorative.  ?The patient reports she usually goes to bed around 9 PM watches TV in the bedroom. About one hour later she will go to sleep, but wakes up 2-3 times to go to the bathroom. If she turns over she will wake up, too. She has sometimes trouble to go back to sleep.  She always wakes up at 2.30 and she finds no further restful sleep after that, rises at 5.30, spontaneously, no alarm needed. She often has a dry mouth when waking up. She does not report any nocturnal headaches usually. ?The overall nocturnal sleep time is between 4-5 hours only, she takes  One hour nap in the afternoon, about 2-3 PM, and on her sofa. Her husband sleeps not in the same room, she snores and has woken herself up form snoring.  Her dog sleeps nearby, but never wakes her. She has owned a dog before that needed physically to be brought outside to urinate , 7-8 times at night. This dog died about 2 years ago.  ?She doesn't take any caffeine anymore. The sleep pattern may have changed than.  ?Family history - one cousin with OSA , non compliant  CPAP . ?Reviewed medication and alLergies,  HISTORY  ? ? ?06-10-14 ?Julie Owen is here today for her first visit after sleep study and  CPAP titration. The patient underwent a polysomnography on 12-17-13 which revealed a mild apnea with an AHI of 10.5 and an RDI of 14.6 per hour but associated with oxygen desaturations. In the setting of snoring and periodic limb movements a CPAP titration was not performed the same night as the patient did not have enough sleep time at line. She returned on 02-18-14 for the CPAP titration,  her AHI was reduced to 1.4,  periodic limb movements were  rare - and she did best at 8 cm water pressure under which she slept 492 minutes.  ? ? PLAN : [facility] to exchange the machine, ASAP. RV in 3 month with NP for follow up.  ?The patient still has some nasal congestion which may contribute to her difficulties breathing at night at least on occasion. I recommend a saltwater nasal spray to just literally flush the nostril. ?I have generated an electronic order to apnea to make sure that the patients machine will be exchanged for another one ASAP. We do not have to change the mask, Interface, Tubing or filter at this time. ? ? ?Interval history from 08-15-14  ?Julie Owen has seen  benefit of a new CPAP machine. She had undergone an overnight pulse oximetry by using CPAP. But had downloaded data spoke for a very well controlled sleep apnea with an residual AHI file below 5 her fatigue severity was very high and she was excessively daytime sleepy as well.  ?It turned out that she remained hypoxemic and that a new machine was needed to control her sleep apnea as well as her oxygen levels. Her  durable medical equipment company, namely a [employer], exchanged her old not longer working machine for a new model which now is doing well. Her fatigue severity score today for further is 19 and her Epworth s

## 2021-08-23 ENCOUNTER — Ambulatory Visit: Payer: Medicare Other | Admitting: Family Medicine

## 2021-09-26 ENCOUNTER — Other Ambulatory Visit (HOSPITAL_COMMUNITY): Payer: Self-pay | Admitting: Internal Medicine

## 2021-09-26 DIAGNOSIS — Z1231 Encounter for screening mammogram for malignant neoplasm of breast: Secondary | ICD-10-CM

## 2021-11-08 ENCOUNTER — Ambulatory Visit (HOSPITAL_COMMUNITY)
Admission: RE | Admit: 2021-11-08 | Discharge: 2021-11-08 | Disposition: A | Discharge status: Home / Self Care | Payer: Medicare Other | Source: Ambulatory Visit | Attending: Internal Medicine | Admitting: Internal Medicine

## 2021-11-08 DIAGNOSIS — Z1231 Encounter for screening mammogram for malignant neoplasm of breast: Secondary | ICD-10-CM | POA: Diagnosis present

## 2021-11-19 DIAGNOSIS — R002 Palpitations: Secondary | ICD-10-CM | POA: Insufficient documentation

## 2021-11-19 NOTE — Progress Notes (Unsigned)
Cardiology Office Note   Date:  11/21/2021   ID:  Julie Owen, Julie Owen 04-05-1946, MRN [id number]  PCP:  Donnajean Lopes, MD  Cardiologist:   Minus Breeding, MD  Chief Complaint  Patient presents with   Palpitations      History of Present Illness: Julie Owen is a 76 y.o. female who presents for evaluation of an ASD.  This was found at [facility] in the past on TEE.  She was there was a suggestion about a closure but the patient refused a cardiac MRI because of a dye allergy.  She had a transthoracic echocardiogram in 2018 with a well-preserved ejection fraction.  She is there was a possible PFO or small ASD with very small left to right shunting.  She was followed clinically and had previously been seen by Dr. Bronson Ing.   This is the second visit with me.    Since she was last seen she has done relatively well from a cardiovascular standpoint.  She occasionally gets some palpitations but these are not any different from previous.  She does not have any shortness of breath, PND or orthopnea.  However, she is very limited by joint pains related to polymyalgia rheumatica.  She completed several months of steroids and her symptoms have progressed.  She is very sad today because she lost her poodle a couple of weeks ago.   Past Medical History:  Diagnosis Date   Allergy    Anxiety    Atrial septal defect    Depression    Diabetes mellitus without complication (HCC)    Hyperlipidemia    Hypertension    Neuromuscular disorder (HCC)    neuropathy   Obesity    Osteoarthritis of lumbar spine    Osteoarthritis of thumb    bilateral   Prediabetes    Renal disorder    kidney stones   Sleep apnea    uses CPAP    Past Surgical History:  Procedure Laterality Date   BREAST REDUCTION SURGERY     BREAST SURGERY  2008   Right breast   BUNIONECTOMY  1993   Right foot   CATARACT EXTRACTION     CYSTOSCOPY W/ URETERAL STENT PLACEMENT Left 05/31/2015   Procedure: CYSTOSCOPY WITH STENT  EXCHANGE;  Surgeon: Cleon Gustin, MD;  Location: [facility];  Service: Urology;  Laterality: Left;   CYSTOSCOPY WITH RETROGRADE PYELOGRAM, URETEROSCOPY AND STENT PLACEMENT Left 05/19/2015   Procedure: CYSTOSCOPY WITH RETROGRADE PYELOGRAM, URETEROSCOPY AND STENT PLACEMENT;  Surgeon: Cleon Gustin, MD;  Location: [facility];  Service: Urology;  Laterality: Left;   CYSTOSCOPY WITH RETROGRADE PYELOGRAM, URETEROSCOPY AND STENT PLACEMENT Left 02/14/2018   Procedure: CYSTOSCOPY WITH RETROGRADE PYELOGRAM, diagnostic URETEROSCOPY;  Surgeon: Alexis Frock, MD;  Location: [facility];  Service: Urology;  Laterality: Left;   CYSTOSCOPY/RETROGRADE/URETEROSCOPY/STONE EXTRACTION WITH BASKET Left 05/31/2015   Procedure: CYSTOSCOPY/RETROGRADE/STONE EXTRACTION WITH BASKET;  Surgeon: Cleon Gustin, MD;  Location: [facility];  Service: Urology;  Laterality: Left;   eyelid surgery Bilateral    Blephroplasty   REDUCTION MAMMAPLASTY Bilateral 2003   STONE EXTRACTION WITH BASKET Left 05/19/2015   Procedure: STONE EXTRACTION WITH BASKET;  Surgeon: Cleon Gustin, MD;  Location: [facility];  Service: Urology;  Laterality: Left;   TONSILLECTOMY     TOTAL HIP ARTHROPLASTY  08/2010   Right hip     Current Outpatient Medications  Medication Sig Dispense Refill   antiseptic oral rinse (BIOTENE) LIQD 15 mLs by Mouth Rinse route  as needed for dry mouth.     Ascorbic Acid (VITAMIN C PO) Take by mouth.     Calcium Carb-Cholecalciferol (CALCIUM 500 + D3 PO) Take by mouth.     calcium-vitamin D (OSCAL WITH D) 500-200 MG-UNIT tablet Take 1 tablet by mouth.     ezetimibe (ZETIA) 10 MG tablet Take 10 mg by mouth daily.     losartan (COZAAR) 50 MG tablet Take 1 tablet (50 mg total) by mouth 2 (two) times daily. 180 tablet 3   metFORMIN (GLUCOPHAGE-XR) 500 MG 24 hr tablet Take 500 mg by mouth at bedtime.      metroNIDAZOLE (METROGEL) 0.75 % gel Apply 1 application topically 2 (two) times daily.     Polyethyl Glycol-Propyl Glycol  (SYSTANE OP) Apply to eye.     TURMERIC PO Take 1 tablet by mouth daily.     Misc. Devices MISC CPAP     Current Facility-Administered Medications  Medication Dose Route Frequency Provider Last Rate Last Admin   0.9 %  sodium chloride infusion  500 mL Intravenous Continuous Pyrtle, Lajuan Lines, MD        Allergies:   Aspirin, Benzocaine, Caffeine, Codeine, Lidocaine-epinephrine, Oxycodone-acetaminophen, Percocet [oxycodone-acetaminophen], Pravastatin, Procaine hcl, Pseudoephedrine hcl er, and Simvastatin    ROS:  Please see the history of present illness.   Otherwise, review of systems are positive for none.   All other systems are reviewed and negative.    PHYSICAL EXAM: VS:  BP (!) 150/88   Pulse 63   Ht '5\' 5"'$  (1.651 m)   Wt 172 lb (78 kg)   BMI 28.62 kg/m  , BMI Body mass index is 28.62 kg/m. GENERAL:  Well appearing NECK:  No jugular venous distention, waveform within normal limits, carotid upstroke brisk and symmetric, no bruits, no thyromegaly LUNGS:  Clear to auscultation bilaterally CHEST:  Unremarkable HEART:  PMI not displaced or sustained,S1 and S2 within normal limits, no S3, no S4, no clicks, no rubs, no murmurs ABD:  Flat, positive bowel sounds normal in frequency in pitch, no bruits, no rebound, no guarding, no midline pulsatile mass, no hepatomegaly, no splenomegaly EXT:  2 plus pulses throughout, no edema, no cyanosis no clubbing   EKG:  EKG is  ordered today. The ekg ordered today demonstrates sinus rhythm, rate 63, axis within normal limits, intervals within normal limits, premature ectopic complex, nonspecific T wave changes.   Recent Labs: No results found for requested labs within last 365 days.    Lipid Panel    Component Value Date/Time   CHOL 285 (H) 01/10/2012 1124   TRIG 198.0 (H) 01/10/2012 1124   HDL 54.60 01/10/2012 1124   CHOLHDL 5 01/10/2012 1124   VLDL 39.6 01/10/2012 1124   LDLDIRECT 206.0 01/10/2012 1124      Wt Readings from Last 3  Encounters:  11/21/21 172 lb (78 kg)  08/21/21 174 lb (78.9 kg)  12/21/20 186 lb 12.8 oz (84.7 kg)      Other studies Reviewed: Additional studies/ records that were reviewed today include: Labs. Review of the above records demonstrates:  Please see elsewhere in the note.       ASSESSMENT AND PLAN:   Atrial septal defect/low secundum ASD: We are going to continue to manage this conservatively.  I see no change in her exam or symptoms.    Essential HTN: Her blood pressure is elevated.  She is going to keep a blood pressure diary and drop that off to me.  I am  going to go ahead and increase the Cozaar to 50 mg twice daily.   Hyperlipidemia:    Her LDL is not controlled but she is intolerant of statins.  We again talked about a plant-based diet.    Sleep apnea: She uses CPAP.     Palpitations:   These are not particular problematic.  No change in therapy.    Current medicines are reviewed at length with the patient today.  The patient does not have concerns regarding medicines.  The following changes have been made: As above  Labs/ tests ordered today include:   Orders Placed This Encounter  Procedures   EKG 12-Lead     Disposition:   FU with me in in 1 year at her request   Signed, Minus Breeding, MD  11/21/2021 5:13 PM    [facility]

## 2021-11-21 ENCOUNTER — Encounter: Payer: Self-pay | Admitting: Cardiology

## 2021-11-21 ENCOUNTER — Ambulatory Visit: Payer: Medicare Other | Admitting: Cardiology

## 2021-11-21 VITALS — BP 150/88 | HR 63 | Ht 65.0 in | Wt 172.0 lb

## 2021-11-21 DIAGNOSIS — Q2112 Patent foramen ovale: Secondary | ICD-10-CM | POA: Diagnosis not present

## 2021-11-21 DIAGNOSIS — R002 Palpitations: Secondary | ICD-10-CM

## 2021-11-21 DIAGNOSIS — I10 Essential (primary) hypertension: Secondary | ICD-10-CM | POA: Diagnosis not present

## 2021-11-21 MED ORDER — LOSARTAN POTASSIUM 50 MG PO TABS: 50.0000 mg | ORAL_TABLET | Freq: Two times a day (BID) | ORAL | 3 refills | Status: DC

## 2021-11-21 NOTE — Patient Instructions (Signed)
Medication Instructions:  Please increase your Cozaar/Losartan to 50 mg twice a day. Continue all other medications as listed.  *If you need a refill on your cardiac medications before your next appointment, please call your pharmacy*  Please keep a blood pressure diary.  Omron Blood Pressure Monitor   Follow-Up: At [facility], you and your health needs are our priority.  As part of our continuing mission to provide you with exceptional heart care, we have created designated Provider Care Teams.  These Care Teams include your primary Cardiologist (physician) and Advanced Practice Providers (APPs -  Physician Assistants and Nurse Practitioners) who all work together to provide you with the care you need, when you need it.  We recommend signing up for the patient portal called "MyChart".  Sign up information is provided on this After Visit Summary.  MyChart is used to connect with patients for Virtual Visits (Telemedicine).  Patients are able to view lab/test results, encounter notes, upcoming appointments, etc.  Non-urgent messages can be sent to your provider as well.   To learn more about what you can do with MyChart, go to [email or web address].    Your next appointment:   1 year(s)  The format for your next appointment:   In Person  Provider:   Minus Breeding, MD{   Important Information About Sugar

## 2021-11-29 ENCOUNTER — Ambulatory Visit (HOSPITAL_COMMUNITY)
Admission: RE | Admit: 2021-11-29 | Discharge: 2021-11-29 | Disposition: A | Discharge status: Home / Self Care | Payer: Medicare Other | Source: Ambulatory Visit | Attending: Urology | Admitting: Urology

## 2021-11-29 DIAGNOSIS — N200 Calculus of kidney: Secondary | ICD-10-CM | POA: Diagnosis present

## 2021-12-07 ENCOUNTER — Encounter: Payer: Self-pay | Admitting: Urology

## 2021-12-07 ENCOUNTER — Ambulatory Visit: Payer: Medicare Other | Admitting: Urology

## 2021-12-07 VITALS — BP 115/74 | HR 66

## 2021-12-07 DIAGNOSIS — Z87442 Personal history of urinary calculi: Secondary | ICD-10-CM | POA: Diagnosis not present

## 2021-12-07 DIAGNOSIS — N200 Calculus of kidney: Secondary | ICD-10-CM

## 2021-12-07 NOTE — Progress Notes (Signed)
12/07/2021 1:14 PM   Julie Owen July 07, 1945 [id number]  Referring provider: Donnajean Lopes, [address] [place],  [place] [address]  nephroliothiasis   HPI: Julie Owen is [phone] here for followup for nephrolithiasis. She had a stone event 3 months ago and passed a small calculus. She drinks 30oz of water daily. No flank pain. NO significant LUTS. Renal US shows no calculi. No significant LUTS.    PMH: Past Medical History:  Diagnosis Date   Allergy    Anxiety    Atrial septal defect    Depression    Diabetes mellitus without complication (HCC)    Hyperlipidemia    Hypertension    Neuromuscular disorder (HCC)    neuropathy   Obesity    Osteoarthritis of lumbar spine    Osteoarthritis of thumb    bilateral   Prediabetes    Renal disorder    kidney stones   Sleep apnea    uses CPAP    Surgical History: Past Surgical History:  Procedure Laterality Date   BREAST REDUCTION SURGERY     BREAST SURGERY  2008   Right breast   BUNIONECTOMY  1993   Right foot   CATARACT EXTRACTION     CYSTOSCOPY W/ URETERAL STENT PLACEMENT Left 05/31/2015   Procedure: CYSTOSCOPY WITH STENT EXCHANGE;  Surgeon: Cleon Gustin, MD;  Location: [facility];  Service: Urology;  Laterality: Left;   CYSTOSCOPY WITH RETROGRADE PYELOGRAM, URETEROSCOPY AND STENT PLACEMENT Left 05/19/2015   Procedure: CYSTOSCOPY WITH RETROGRADE PYELOGRAM, URETEROSCOPY AND STENT PLACEMENT;  Surgeon: Cleon Gustin, MD;  Location: [facility];  Service: Urology;  Laterality: Left;   CYSTOSCOPY WITH RETROGRADE PYELOGRAM, URETEROSCOPY AND STENT PLACEMENT Left 02/14/2018   Procedure: CYSTOSCOPY WITH RETROGRADE PYELOGRAM, diagnostic URETEROSCOPY;  Surgeon: Alexis Frock, MD;  Location: [facility];  Service: Urology;  Laterality: Left;   CYSTOSCOPY/RETROGRADE/URETEROSCOPY/STONE EXTRACTION WITH BASKET Left 05/31/2015   Procedure: CYSTOSCOPY/RETROGRADE/STONE EXTRACTION WITH BASKET;  Surgeon: Cleon Gustin, MD;  Location:  [facility];  Service: Urology;  Laterality: Left;   eyelid surgery Bilateral    Blephroplasty   REDUCTION MAMMAPLASTY Bilateral 2003   STONE EXTRACTION WITH BASKET Left 05/19/2015   Procedure: STONE EXTRACTION WITH BASKET;  Surgeon: Cleon Gustin, MD;  Location: [facility];  Service: Urology;  Laterality: Left;   TONSILLECTOMY     TOTAL HIP ARTHROPLASTY  08/2010   Right hip    Home Medications:  Allergies as of 12/07/2021       Reactions   Aspirin Other (See Comments)   Abdominal pain   Benzocaine    Caffeine    Panic attacks   Codeine Swelling   Lidocaine-epinephrine Other (See Comments)   Oxycodone-acetaminophen Swelling   Percocet [oxycodone-acetaminophen] Swelling   Pravastatin    myalgia   Procaine Hcl    Pseudoephedrine Hcl Er Other (See Comments)   Jittery, nervousness   Simvastatin    REACTION: hair loss        Medication List        Accurate as of December 07, 2021  1:14 PM. If you have any questions, ask your nurse or doctor.          antiseptic oral rinse Liqd 15 mLs by Mouth Rinse route as needed for dry mouth.   CALCIUM 500 + D3 PO Take by mouth.   calcium-vitamin D 500-200 MG-UNIT tablet Commonly known as: OSCAL WITH D Take 1 tablet by mouth.   ezetimibe 10 MG tablet Commonly known as:  ZETIA Take 10 mg by mouth daily.   losartan 50 MG tablet Commonly known as: Cozaar Take 1 tablet (50 mg total) by mouth 2 (two) times daily.   metFORMIN 500 MG 24 hr tablet Commonly known as: GLUCOPHAGE-XR Take 500 mg by mouth at bedtime.   metroNIDAZOLE 0.75 % gel Commonly known as: METROGEL Apply 1 application topically 2 (two) times daily.   Misc. Devices Misc CPAP   rosuvastatin 10 MG tablet Commonly known as: CRESTOR Take by mouth.   SYSTANE OP Apply to eye.   TURMERIC PO Take 1 tablet by mouth daily.   VITAMIN C PO Take by mouth.        Allergies:  Allergies  Allergen Reactions   Aspirin Other (See Comments)    Abdominal pain    Benzocaine    Caffeine     Panic attacks   Codeine Swelling   Lidocaine-Epinephrine Other (See Comments)   Oxycodone-Acetaminophen Swelling   Percocet [Oxycodone-Acetaminophen] Swelling   Pravastatin     myalgia   Procaine Hcl    Pseudoephedrine Hcl Er Other (See Comments)    Jittery, nervousness   Simvastatin     REACTION: hair loss    Family History: Family History  Problem Relation Age of Onset   Hyperlipidemia Mother    Hypertension Mother    Heart disease Mother    Macular degeneration Father    Cancer Sister        colon   Hypertension Sister    Breast cancer Sister    Hypertension Other    Kidney disease Other    Stroke Other    Heart attack Other    Cancer Other    Obesity Other     Social History:  reports that she quit smoking about 46 years ago. Her smoking use included cigarettes. She started smoking about 56 years ago. She has a 5.00 pack-year smoking history. She has never used smokeless tobacco. She reports that she does not drink alcohol and does not use drugs.  ROS: All other review of systems were reviewed and are negative except what is noted above in HPI  Physical Exam: BP 115/74   Pulse 66   Constitutional:  Alert and oriented, No acute distress. HEENT: [place] AT, moist mucus membranes.  Trachea midline, no masses. Cardiovascular: No clubbing, cyanosis, or edema. Respiratory: Normal respiratory effort, no increased work of breathing. GI: Abdomen is soft, nontender, nondistended, no abdominal masses GU: No CVA tenderness.  Lymph: No cervical or inguinal lymphadenopathy. Skin: No rashes, bruises or suspicious lesions. Neurologic: Grossly intact, no focal deficits, moving all 4 extremities. Psychiatric: Normal mood and affect.  Laboratory Data: Lab Results  Component Value Date   WBC 9.4 05/18/2015   HGB 13.2 05/18/2015   HCT 40.8 05/18/2015   MCV 92.5 05/18/2015   PLT 255 05/18/2015    Lab Results  Component Value Date   CREATININE 0.69  03/10/2019    No results found for: "PSA"  No results found for: "TESTOSTERONE"  Lab Results  Component Value Date   HGBA1C 5.8 01/10/2012    Urinalysis    Component Value Date/Time   COLORURINE YELLOW 05/18/2015 1050   APPEARANCEUR Clear 12/06/2020 1317   LABSPEC 1.014 05/18/2015 1050   PHURINE 6.5 05/18/2015 1050   GLUCOSEU Negative 12/06/2020 1317   HGBUR LARGE (A) 05/18/2015 1050   BILIRUBINUR Negative 12/06/2020 1317   KETONESUR 15 (A) 05/18/2015 1050   PROTEINUR Negative 12/06/2020 [place] 05/18/2015 1050  UROBILINOGEN 0.2 04/09/2012 1800   NITRITE Negative 12/06/2020 1317   NITRITE NEGATIVE 05/18/2015 1050   LEUKOCYTESUR 2+ (A) 12/06/2020 1317    Lab Results  Component Value Date   LABMICR See below: 12/06/2020   WBCUA >30 (A) 12/06/2020   LABEPIT 0-10 12/06/2020   BACTERIA Few 12/06/2020    Pertinent Imaging: Renal US 11/29/2021: Images reviewed and discussed with the patient  Results for orders placed during the hospital encounter of 05/13/15  DG Abd 1 View  Narrative CLINICAL DATA:  Left flank pain  EXAM: ABDOMEN - 1 VIEW  COMPARISON:  04/13/2012  FINDINGS: There are no disproportionally dilated loops of bowel. There is no obvious free intraperitoneal gas. Right total hip arthroplasty is stable.  IMPRESSION: Nonobstructive bowel gas pattern.   Electronically Signed By: Marybelle Killings M.D. On: 05/13/2015 14:43  No results found for this or any previous visit.  No results found for this or any previous visit.  No results found for this or any previous visit.  Results for orders placed during the hospital encounter of 11/29/21  Ultrasound renal complete  Narrative CLINICAL DATA:  Nephrolithiasis  EXAM: RENAL / URINARY TRACT ULTRASOUND COMPLETE  COMPARISON:  11/28/2020  FINDINGS: Right Kidney:  Renal measurements: 9.2 x 4.6 x 5.4 cm = volume: 119.5 mL. Echogenicity within normal limits. No mass or  hydronephrosis visualized.  Left Kidney:  Renal measurements: 11.2 x 4.1 by 4.5 cm = volume: 129.3 mL. Echogenicity within normal limits. No mass or hydronephrosis visualized.  Bladder:  Appears normal for degree of bladder distention.  Other:  None.  IMPRESSION: 1. Unremarkable renal ultrasound.   Electronically Signed By: Randa Ngo M.D. On: 11/29/2021 22:12  No results found for this or any previous visit.  No results found for this or any previous visit.  Results for orders placed during the hospital encounter of 05/11/15  CT RENAL STONE STUDY  Narrative CLINICAL DATA:  76 year old female with left-sided flank pain.  EXAM: CT ABDOMEN AND PELVIS WITHOUT CONTRAST  TECHNIQUE: Multidetector CT imaging of the abdomen and pelvis was performed following the standard protocol without IV contrast.  COMPARISON:  CT dated 04/09/2012 and lumbar spine MRI dated 09/05/2011  FINDINGS: Evaluation of this exam is limited in the absence of intravenous contrast. Minimal bibasilar linear atelectasis/scarring. The visualized lung bases are otherwise clear. No intra-abdominal free air or free fluid.  Diffuse hepatic steatosis. An 8 mm ill-defined right hepatic hypodense lesion (series 2, image 34) is not well characterized on this noncontrast study but likely represents a cyst or hemangioma. CT with contrast or MRI is recommended for further evaluation. The gallbladder, pancreas, spleen, adrenal glands, right kidney and right ureter, and urinary bladder appear unremarkable. There is a 4 mm left ureteropelvic junction stone with mild left hydronephrosis. Hysterectomy.  There is sigmoid diverticulosis without active inflammatory changes. There is a 1.4 cm low attenuatin/fatty lesion in the distal small bowel in the right lower quadrant (series 2, image 60) which may represent a small lipoma. There is no evidence of bowel obstruction. Normal appendix.  There is  aortoiliac atherosclerotic disease. No portal venous gas identified. There is no adenopathy.  Small fat containing umbilical hernia. The abdominal wall soft tissues appear unremarkable. There is a right hip arthroplasty. There multilevel degenerative changes of the spine. No acute fractures.  IMPRESSION: A 4 mm left UPJ stone with mild left hydronephrosis.  Fatty liver. A subcentimeter right hepatic hypodense lesion, likely a hemangioma or cyst. MRI may provide  better characterization.   Electronically Signed By: Anner Crete M.D. On: 05/11/2015 23:45   Assessment & Plan:    1. Kidney stone RTC 1 year with renal US   No follow-ups on file.  Nicolette Bang, MD  [facility] [place]

## 2021-12-07 NOTE — Patient Instructions (Signed)
Dietary Guidelines to Help Prevent Kidney Stones Kidney stones are deposits of minerals and salts that form inside your kidneys. Your risk of developing kidney stones may be greater depending on your diet, your lifestyle, the medicines you take, and whether you have certain medical conditions. Most people can lower their chances of developing kidney stones by following the instructions below. Your dietitian may give you more specific instructions depending on your overall health and the type of kidney stones you tend to develop. What are tips for following this plan? Reading food labels  Choose foods with "no salt added" or "low-salt" labels. Limit your salt (sodium) intake to less than 1,500 mg a day. Choose foods with calcium for each meal and snack. Try to eat about 300 mg of calcium at each meal. Foods that contain 200-500 mg of calcium a serving include: 8 oz (237 mL) of milk, calcium-fortifiednon-dairy milk, and calcium-fortifiedfruit juice. Calcium-fortified means that calcium has been added to these drinks. 8 oz (237 mL) of kefir, yogurt, and soy yogurt. 4 oz (114 g) of tofu. 1 oz (28 g) of cheese. 1 cup (150 g) of dried figs. 1 cup (91 g) of cooked broccoli. One 3 oz (85 g) can of sardines or mackerel. Most people need 1,000-1,500 mg of calcium a day. Talk to your dietitian about how much calcium is recommended for you. Shopping Buy plenty of fresh fruits and vegetables. Most people do not need to avoid fruits and vegetables, even if these foods contain nutrients that may contribute to kidney stones. When shopping for convenience foods, choose: Whole pieces of fruit. Pre-made salads with dressing on the side. Low-fat fruit and yogurt smoothies. Avoid buying frozen meals or prepared deli foods. These can be high in sodium. Look for foods with live cultures, such as yogurt and kefir. Choose high-fiber grains, such as whole-wheat breads, oat bran, and wheat cereals. Cooking Do not add  salt to food when cooking. Place a salt shaker on the table and allow each person to add his or her own salt to taste. Use vegetable protein, such as beans, textured vegetable protein (TVP), or tofu, instead of meat in pasta, casseroles, and soups. Meal planning Eat less salt, if told by your dietitian. To do this: Avoid eating processed or pre-made food. Avoid eating fast food. Eat less animal protein, including cheese, meat, poultry, or fish, if told by your dietitian. To do this: Limit the number of times you have meat, poultry, fish, or cheese each week. Eat a diet free of meat at least 2 days a week. Eat only one serving each day of meat, poultry, fish, or seafood. When you prepare animal protein, cut pieces into small portion sizes. For most meat and fish, one serving is about the size of the palm of your hand. Eat at least five servings of fresh fruits and vegetables each day. To do this: Keep fruits and vegetables on hand for snacks. Eat one piece of fruit or a handful of berries with breakfast. Have a salad and fruit at lunch. Have two kinds of vegetables at dinner. Limit foods that are high in a substance called oxalate. These include: Spinach (cooked), rhubarb, beets, sweet potatoes, and Swiss chard. Peanuts. Potato chips, french fries, and baked potatoes with skin on. Nuts and nut products. Chocolate. If you regularly take a diuretic medicine, make sure to eat at least 1 or 2 servings of fruits or vegetables that are high in potassium each day. These include: Avocado. Banana. Orange, prune,   carrot, or tomato juice. Baked potato. Cabbage. Beans and split peas. Lifestyle  Drink enough fluid to keep your urine pale yellow. This is the most important thing you can do. Spread your fluid intake throughout the day. If you drink alcohol: Limit how much you use to: 0-1 drink a day for women who are not pregnant. 0-2 drinks a day for men. Be aware of how much alcohol is in your  drink. In the [place] one drink equals one 12 oz bottle of beer (355 mL), one 5 oz glass of wine (148 mL), or one 1 oz glass of hard liquor (44 mL). Lose weight if told by your health care provider. Work with your dietitian to find an eating plan and weight loss strategies that work best for you. General information Talk to your health care provider and dietitian about taking daily supplements. You may be told the following depending on your health and the cause of your kidney stones: Not to take supplements with vitamin C. To take a calcium supplement. To take a daily probiotic supplement. To take other supplements such as magnesium, fish oil, or vitamin B6. Take over-the-counter and prescription medicines only as told by your health care provider. These include supplements. What foods should I limit? Limit your intake of the following foods, or eat them as told by your dietitian. Vegetables Spinach. Rhubarb. Beets. Canned vegetables. Pickles. Olives. Baked potatoes with skin. Grains Wheat bran. Baked goods. Salted crackers. Cereals high in sugar. Meats and other proteins Nuts. Nut butters. Large portions of meat, poultry, or fish. Salted, precooked, or cured meats, such as sausages, meat loaves, and hot dogs. Dairy Cheese. Beverages Regular soft drinks. Regular vegetable juice. Seasonings and condiments Seasoning blends with salt. Salad dressings. Soy sauce. Ketchup. Barbecue sauce. Other foods Canned soups. Canned pasta sauce. Casseroles. Pizza. Lasagna. Frozen meals. Potato chips. French fries. The items listed above may not be a complete list of foods and beverages you should limit. Contact a dietitian for more information. What foods should I avoid? Talk to your dietitian about specific foods you should avoid based on the type of kidney stones you have and your overall health. Fruits Grapefruit. The item listed above may not be a complete list of foods and beverages you should  avoid. Contact a dietitian for more information. Summary Kidney stones are deposits of minerals and salts that form inside your kidneys. You can lower your risk of kidney stones by making changes to your diet. The most important thing you can do is drink enough fluid. Drink enough fluid to keep your urine pale yellow. Talk to your dietitian about how much calcium you should have each day, and eat less salt and animal protein as told by your dietitian. This information is not intended to replace advice given to you by your health care provider. Make sure you discuss any questions you have with your health care provider. Document Revised: 01/08/2021 Document Reviewed: 01/08/2021 Elsevier Patient Education  2023 [employer]  

## 2022-01-02 ENCOUNTER — Ambulatory Visit: Payer: Medicare Other | Admitting: Family Medicine

## 2022-01-03 ENCOUNTER — Ambulatory Visit: Payer: Medicare Other | Admitting: Family Medicine

## 2022-01-03 ENCOUNTER — Encounter: Payer: Self-pay | Admitting: Family Medicine

## 2022-01-03 VITALS — BP 122/63 | HR 62 | Ht 65.0 in | Wt 180.8 lb

## 2022-01-03 DIAGNOSIS — F5104 Psychophysiologic insomnia: Secondary | ICD-10-CM | POA: Diagnosis not present

## 2022-01-03 DIAGNOSIS — G4733 Obstructive sleep apnea (adult) (pediatric): Secondary | ICD-10-CM

## 2022-01-03 DIAGNOSIS — Z9989 Dependence on other enabling machines and devices: Secondary | ICD-10-CM

## 2022-01-03 DIAGNOSIS — F4321 Adjustment disorder with depressed mood: Secondary | ICD-10-CM

## 2022-01-03 NOTE — Progress Notes (Signed)
CM sent to [facility] for new order ?

## 2022-01-03 NOTE — Patient Instructions (Signed)
Please continue using your CPAP regularly. While your insurance requires that you use CPAP at least 4 hours each night on 70% of the nights, I recommend, that you not skip any nights and use it throughout the night if you can. Getting used to CPAP and staying with the treatment long term does take time and patience and discipline. Untreated obstructive sleep apnea when it is moderate to severe can have an adverse impact on cardiovascular health and raise her risk for heart disease, arrhythmias, hypertension, congestive heart failure, stroke and diabetes. Untreated obstructive sleep apnea causes sleep disruption, nonrestorative sleep, and sleep deprivation. This can have an impact on your day to day functioning and cause daytime sleepiness and impairment of cognitive function, memory loss, mood disturbance, and problems focussing. Using CPAP regularly can improve these symptoms.   I will send a referral to psychology for insomnia and grief counseling. I am so sorry to hear about Blue.   Follow up with me in 1 year

## 2022-01-03 NOTE — Progress Notes (Signed)
PATIENT: Julie Owen DOB: 28-Jun-1945  REASON FOR VISIT: follow up HISTORY FROM: patient  Chief Complaint  Patient presents with   OSA ON CPAP    Rm 6 One Year FU  "Resmed CPAP works well I like it, last time I was sent supplies I was sent some supplies for old Hardin Negus CPAP and some for my Resmed; will get more new supplies at end of Sept"      HISTORY OF PRESENT ILLNESS:  01/03/2022 ALL: Julie Owen is doing well on CPAP. She is adjusting to new machine. She has had some trouble with [employer] sending incorrect supplies but is communicating with them. She lost her puddle, "Blue", and having more concerns of depression. She is not sleeping well. She requests a referral to psychology.     12/21/2020 ALL:  Julie Owen returns for follow up for OSA on CPAP. She continues to do very well. She uses CPAP nightly and greater than 4 hours each night. She denies concerns with CPAP machine or supplies. She does request that we order a new CPAP as her current machine is 76 years old.   Compliance report dated 11/20/2020-12/20/2020 shows that she used CPAP 30/30 nights for greater than 4 hours. Average usage was 8 hours. Residual AHI was 0.6/hr on set pressure of 8cmH20. No leak noted.   12/22/2019 ALL:  Julie Owen is a 76 y.o. female here today for follow up for OSA on CPAP. She is doing well. She is using CPAP every night. She has noted hair loss/breakage over the past 4-5 weeks. She is uncertain of cause. She is seeing dermatology next week for an evaluation. She is weaning prednisone for polymyalgia rheumatica. She is followed by PCP regularly and reports normal labs. No new medications or stressors. She is using a Dreamwear Nasal pillow with headgear. She has used this mask for about 5 years.   Compliance report dated 11/21/2019 through 12/20/2019 reveals that she is used CPAP 30 of the past 30 days for compliance of 100%.  She has used CPAP greater than 4 hours 30 of the last 30 days for compliance of  100%.  Average usage is 8 hours and 36 minutes.  Residual AHI is 2.0 on 8 cm of water and an EPR of 2.  There is no significant leak noted.  HISTORY: (copied from my note on 12/21/2018)  Julie Owen is a 76 y.o. female here today for follow up of OSA on CPAP.  She is doing very well with CPAP at home.  She is using her machine nightly.  She does note that she is resting better and has more energy during the day.  She began CPAP therapy in October 2015.  She is eligible for a new machine in October of this year.  She returns for evaluation.   Compliance report dated 11/17/2018 through 12/16/2018 reveals that she is using CPAP every night for compliance of 100%.  She is using CPAP greater than 4 hours every night.  Average usage is 9 hours and 10 minutes.  AHI was 0.9 on 8 cm of water and an EPR of 2.  There was no significant leak.   HISTORY: (copied from Dr Dohmeier's note on 12/18/2017)   Revisit date is 18 December 2017, I have the pleasure of seeing Julie Owen today for a yearly revisit for evaluation of CPAP compliance in the treatment of obstructive sleep apnea.  Julie Owen is meanwhile 76 years old she continues to  use her CPAP for his highest compliance, 100% for the last 30 days with an average use at time of 7 hours 51 minutes, CPAP is set at 8 cmH2O with 2 cm EPR and her residual AHI is 0.8/h.  She is using an air sense 10 AutoSet the air leaks were mild, and she has not been woken by snoring, gasping for air or feeling air hungry. She is followed by [facility].    She endorsed today the fatigue severity score 27 and the Epworth sleepiness score at 5 out of 24 points, with a geriatric depression score endorsed at 11 out of 15. 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 He is demented and has CHF.  The stress made her sick, and her fathers cardiologist had refered him to hospice for respite care.   4-5  -2018. Julie Owen is a 76 y.o. female, who is seen here as a referral from Dr. Philip Aspen for an insomnia and snoring evaluation, ruling out apnea. Mrs. Beed describes today a very troubling development. But she had no difficulties getting adjusted to CPAP use and for the first year was a very compliant user she then developed a tendency to mouth breathe- woke up with a parched dry "sticky " tongue, and now was placed in a full face mask with little improvement. Her sleep time has always been only 4-5 hours at night before CPAP was initiated but now her sleep time is even less. She endorsed the fatigue severity score today at 32 points, the Epworth sleepiness score at 8 and the geriatric depression score at a very high rate at 10 out of 15 Points. She uses a chin strip.  Her compliance has been excellent in spite of the troubles that 100% 5 hours 19 minutes each night ( but was 6-7 hours before) , 8 cm water pressure is currently used was 27 m EPR the residual AHI is 1.3. Her humidifier was not adjusted by the durable medical equipment company, she is currently followed by [facility].She had no medication adjustments, and we were not able to identify other factors that could have led to a dry mouth. She does have dry eyes now- to she reports that she has an air leak into the eyes which irritates the eyes. Systane gel at night, drops in daytime. I am looking for sicca symptoms and for a more comfortable mask. I will again place her on a nasasl pillow. Currently on an [employer].    Consultation, CD The patient is a retired Pharmacist, hospital, and still has some engrained sleep habits, such as rising at 5 AM as she did for over 40 years. She is not sleepy but fatigued, she exclaimed. She had related the changes in her sleep pattern to her menopause. She developed palpitations from caffeine in peri menopause, felt panicked and anxious. She used" to sleep like a baby", but at age 74 she found herself in  completed  Menopause. She could finally relate hot flushes and mood changes, tearfulness. Her sleep never recovered. She gained weight and begun snoring. Her sleep has become less and less deep, restorative.  The patient reports she usually goes to bed around 9 PM watches TV in the bedroom. About one hour later she will go to sleep, but wakes up 2-3 times to go to the bathroom. If she turns over she will wake up, too.  She has sometimes trouble to go back to sleep.  She always wakes up at 2.30 and she finds no further restful sleep after that, rises at 5.30, spontaneously, no alarm needed. She often has a dry mouth when waking up. She does not report any nocturnal headaches usually. The overall nocturnal sleep time is between 4-5 hours only, she takes  One hour nap in the afternoon, about 2-3 PM, and on her sofa. Her husband sleeps not in the same room, she snores and has woken herself up form snoring. Her dog sleeps nearby, but never wakes her. She has owned a dog before that needed physically to be brought outside to urinate , 7-8 times at night. This dog died about 2 years ago.  She doesn't take any caffeine anymore. The sleep pattern may have changed than.  Family history - one cousin with OSA , non compliant  CPAP . Reviewed medication and alLergies,  HISTORY    06-10-14 Julie Owen is here today for her first visit after sleep study and CPAP titration. The patient underwent a polysomnography on 12-17-13 which revealed a mild apnea with an AHI of 10.5 and an RDI of 14.6 per hour but associated with oxygen desaturations. In the setting of snoring and periodic limb movements a CPAP titration was not performed the same night as the patient did not have enough sleep time at line. She returned on 02-18-14 for the CPAP titration,  her AHI was reduced to 1.4,  periodic limb movements were  rare - and she did best at 8 cm water pressure under which she slept 492 minutes.    PLAN : [facility] to exchange the machine,  ASAP. RV in 3 month with NP for follow up.  The patient still has some nasal congestion which may contribute to her difficulties breathing at night at least on occasion. I recommend a saltwater nasal spray to just literally flush the nostril. I have generated an electronic order to apnea to make sure that the patients machine will be exchanged for another one ASAP. We do not have to change the mask, Interface, Tubing or filter at this time.   Interval history from 08-15-14  Julie Owen has seen  benefit of a new CPAP machine. She had undergone an overnight pulse oximetry by using CPAP. But had downloaded data spoke for a very well controlled sleep apnea with an residual AHI file below 5 her fatigue severity was very high and she was excessively daytime sleepy as well.  It turned out that she remained hypoxemic and that a new machine was needed to control her sleep apnea as well as her oxygen levels. Her durable medical equipment company, namely a [employer], exchanged her old not longer working machine for a new model which now is doing well. Her fatigue severity score today for further is 19 and her Epworth sleepiness score is 4 points. I was also able to get a new date download. The patient had been diagnosed with sleep apnea seems to do well on the current setting of 8 cm water. She continues to use an Eason nasal mask in medium size. Heated humidification his knee is used. The AHI on 06-10-14 was 0.4 , at  a 97% compliance.The patient reports problems with [facility], being billed several hundred USD a month for a rental machine- and having not filed for insurance coverage ? A broken machine needs to be replaced and is an insurance covered expense.    2017 , RV Julie Owen is now  on her third CPAP machine and received her most recent in December 2016.  She is meanwhile 76 years of age she is using her CPAP with excellent compliance 100% of days and 100% of those days over 4 hours, with an average hourly use of 7 hours  and 46 minutes and the set pressure of 8 cm water this 2 cm expiratory pressure relief.  Her residual AHI is 0.7, which speaks for an excellent resolution. The 95th percentile airleak is close to 8 L/m which is a minimal air leak.   REVIEW OF SYSTEMS: Out of a complete 14 system review of symptoms, the patient complains only of the following symptoms, muscle aches, hair loss, snoring and all other reviewed systems are negative.  ESS 5/24  ALLERGIES: Allergies  Allergen Reactions   Aspirin Other (See Comments)    Abdominal pain   Benzocaine    Caffeine     Panic attacks   Codeine Swelling   Duloxetine Hcl     Other reaction(s): Ineffective for depression   Lidocaine-Epinephrine Other (See Comments)   Oxycodone-Acetaminophen Swelling   Percocet [Oxycodone-Acetaminophen] Swelling   Pravastatin     myalgia   Procaine Hcl    Pseudoephedrine Hcl Er Other (See Comments)    Jittery, nervousness   Simvastatin     REACTION: hair loss   Venlafaxine     Other reaction(s): Ineffective for depression    HOME MEDICATIONS: Outpatient Medications Prior to Visit  Medication Sig Dispense Refill   antiseptic oral rinse (BIOTENE) LIQD 15 mLs by Mouth Rinse route as needed for dry mouth.     Ascorbic Acid (VITAMIN C PO) Take by mouth.     Calcium Carb-Cholecalciferol (CALCIUM 500 + D3 PO) Take by mouth.     calcium-vitamin D (OSCAL WITH D) 500-200 MG-UNIT tablet Take 1 tablet by mouth.     ezetimibe (ZETIA) 10 MG tablet Take 10 mg by mouth daily.     losartan (COZAAR) 50 MG tablet Take 1 tablet (50 mg total) by mouth 2 (two) times daily. 180 tablet 3   metFORMIN (GLUCOPHAGE-XR) 500 MG 24 hr tablet Take 500 mg by mouth at bedtime.      metroNIDAZOLE (METROGEL) 0.75 % gel Apply 1 application topically 2 (two) times daily.     Misc. Devices MISC CPAP     Polyethyl Glycol-Propyl Glycol (SYSTANE OP) Apply to eye.     rosuvastatin (CRESTOR) 10 MG tablet Take by mouth.     TURMERIC PO Take 1 tablet  by mouth daily.     Facility-Administered Medications Prior to Visit  Medication Dose Route Frequency Provider Last Rate Last Admin   0.9 %  sodium chloride infusion  500 mL Intravenous Continuous Pyrtle, Lajuan Lines, MD        PAST MEDICAL HISTORY: Past Medical History:  Diagnosis Date   Allergy    Anxiety    Atrial septal defect    Depression    Diabetes mellitus without complication (HCC)    Hyperlipidemia    Hypertension    Neuromuscular disorder (HCC)    neuropathy   Obesity    Osteoarthritis of lumbar spine    Osteoarthritis of thumb    bilateral   Prediabetes    Renal disorder    kidney stones   Sleep apnea    uses CPAP    PAST SURGICAL HISTORY: Past Surgical History:  Procedure Laterality Date   BREAST REDUCTION SURGERY     BREAST SURGERY  2008   Right  breast   BUNIONECTOMY  1993   Right foot   CATARACT EXTRACTION     CYSTOSCOPY W/ URETERAL STENT PLACEMENT Left 05/31/2015   Procedure: CYSTOSCOPY WITH STENT EXCHANGE;  Surgeon: Cleon Gustin, MD;  Location: [facility];  Service: Urology;  Laterality: Left;   CYSTOSCOPY WITH RETROGRADE PYELOGRAM, URETEROSCOPY AND STENT PLACEMENT Left 05/19/2015   Procedure: CYSTOSCOPY WITH RETROGRADE PYELOGRAM, URETEROSCOPY AND STENT PLACEMENT;  Surgeon: Cleon Gustin, MD;  Location: [facility];  Service: Urology;  Laterality: Left;   CYSTOSCOPY WITH RETROGRADE PYELOGRAM, URETEROSCOPY AND STENT PLACEMENT Left 02/14/2018   Procedure: CYSTOSCOPY WITH RETROGRADE PYELOGRAM, diagnostic URETEROSCOPY;  Surgeon: Alexis Frock, MD;  Location: [facility];  Service: Urology;  Laterality: Left;   CYSTOSCOPY/RETROGRADE/URETEROSCOPY/STONE EXTRACTION WITH BASKET Left 05/31/2015   Procedure: CYSTOSCOPY/RETROGRADE/STONE EXTRACTION WITH BASKET;  Surgeon: Cleon Gustin, MD;  Location: [facility];  Service: Urology;  Laterality: Left;   eyelid surgery Bilateral    Blephroplasty   REDUCTION MAMMAPLASTY Bilateral 2003   STONE EXTRACTION WITH BASKET Left  05/19/2015   Procedure: STONE EXTRACTION WITH BASKET;  Surgeon: Cleon Gustin, MD;  Location: [facility];  Service: Urology;  Laterality: Left;   TONSILLECTOMY     TOTAL HIP ARTHROPLASTY  08/2010   Right hip    FAMILY HISTORY: Family History  Problem Relation Age of Onset   Hyperlipidemia Mother    Hypertension Mother    Heart disease Mother    Macular degeneration Father    Cancer Sister        colon   Hypertension Sister    Breast cancer Sister    Hypertension Other    Kidney disease Other    Stroke Other    Heart attack Other    Cancer Other    Obesity Other     SOCIAL HISTORY: Social History   Socioeconomic History   Marital status: Married    Spouse name: Gwyndolyn Saxon   Number of children: 0   Years of education: Master's   Highest education level: Not on file  Occupational History   Not on file  Tobacco Use   Smoking status: Former    Packs/day: 0.50    Years: 10.00    Total pack years: 5.00    Types: Cigarettes    Start date: 12/11/1965    Quit date: 07/30/1975    Years since quitting: 46.4   Smokeless tobacco: Never  Substance and Sexual Activity   Alcohol use: No    Alcohol/week: 0.0 standard drinks of alcohol   Drug use: No   Sexual activity: Not on file  Other Topics Concern   Not on file  Social History Narrative   Patient is married Gwyndolyn Saxon) and lives at home with her husband.   Patient is a retired Pharmacist, hospital.   Patient has a Master's degree.   Patient is right-handed.   Patient does not drink any caffeine.   Social Determinants of Health   Financial Resource Strain: Not on file  Food Insecurity: Not on file  Transportation Needs: Not on file  Physical Activity: Not on file  Stress: Not on file  Social Connections: Not on file  Intimate Partner Violence: Not on file      PHYSICAL EXAM  Vitals:   01/03/22 1020  BP: 122/63  Pulse: 62  Weight: 180 lb 12.8 oz (82 kg)  Height: '5\' 5"'$  (1.651 m)    Body mass index is 30.09  kg/m.  Generalized: Well developed, in no acute distress  Cardiology: normal rate  and rhythm, no murmur noted Respiratory: clear to auscultation bilaterally  Neurological examination  Mentation: Alert oriented to time, place, history taking. Follows all commands speech and language fluent Cranial nerve II-XII: Pupils were equal round reactive to light. Extraocular movements were full, visual field were full on confrontational test. Facial sensation and strength were normal. Head turning and shoulder shrug  were normal and symmetric. Motor: The motor testing reveals 5 over 5 strength of all 4 extremities. Good symmetric motor tone is noted throughout.  Gait and station: Gait is normal.    DIAGNOSTIC DATA (LABS, IMAGING, TESTING) - I reviewed patient records, labs, notes, testing and imaging myself where available.      No data to display           Lab Results  Component Value Date   WBC 9.4 05/18/2015   HGB 13.2 05/18/2015   HCT 40.8 05/18/2015   MCV 92.5 05/18/2015   PLT 255 05/18/2015      Component Value Date/Time   NA 140 03/10/2019 1120   K 4.7 03/10/2019 1120   CL 104 03/10/2019 1120   CO2 28 03/10/2019 1120   GLUCOSE 115 (H) 03/10/2019 1120   BUN 18 03/10/2019 1120   CREATININE 0.69 03/10/2019 1120   CALCIUM 8.7 (L) 03/10/2019 1120   PROT 6.9 05/11/2015 2332   ALBUMIN 4.1 05/11/2015 2332   AST 42 (H) 05/11/2015 2332   ALT 43 05/11/2015 2332   ALKPHOS 52 05/11/2015 2332   BILITOT 0.8 05/11/2015 2332   GFRNONAA >60 03/10/2019 1120   GFRAA >60 03/10/2019 1120   Lab Results  Component Value Date   CHOL 285 (H) 01/10/2012   HDL 54.60 01/10/2012   LDLDIRECT 206.0 01/10/2012   TRIG 198.0 (H) 01/10/2012   CHOLHDL 5 01/10/2012   Lab Results  Component Value Date   HGBA1C 5.8 01/10/2012   No results found for: "VITAMINB12" Lab Results  Component Value Date   TSH 1.503 03/10/2019     ASSESSMENT AND PLAN 76 y.o. year old female  has a past medical  history of Allergy, Anxiety, Atrial septal defect, Depression, Diabetes mellitus without complication ([place]), Hyperlipidemia, Hypertension, Neuromuscular disorder ([place]), Obesity, Osteoarthritis of lumbar spine, Osteoarthritis of thumb, Prediabetes, Renal disorder, and Sleep apnea. here with      ICD-10-CM   1. Grief  F43.21 Ambulatory referral to Psychology    CANCELED: Ambulatory referral to Psychology    2. Psychophysiological insomnia  F51.04 CANCELED: Ambulatory referral to Psychology    3. OSA on CPAP  G47.33 For home use only DME continuous positive airway pressure (CPAP)   Z99.89        Demeka is doing great from a CPAP perspective. Compliance report reveals excellent compliance. She was encouraged to continue using CPAP nightly and greater than 4 hours each night. I will update supply orders. She is having more difficulty sleeping at night and feels this is related to loss of her puddle, "Blue". Psychology referral placed. She verbalizes understanding and agreement with this plan.    Orders Placed This Encounter  Procedures   For home use only DME continuous positive airway pressure (CPAP)    Supplies    Order Specific Question:   Length of Need    Answer:   Lifetime    Order Specific Question:   Patient has OSA or probable OSA    Answer:   Yes    Order Specific Question:   Is the patient currently using CPAP in the home  Answer:   Yes    Order Specific Question:   Settings    Answer:   Other see comments    Order Specific Question:   CPAP supplies needed    Answer:   Mask, headgear, cushions, filters, heated tubing and water chamber   Ambulatory referral to Psychology    Referral Priority:   Routine    Referral Type:   Psychiatric    Referral Reason:   Specialty Services Required    Requested Specialty:   Psychology    Number of Visits Requested:   1      No orders of the defined types were placed in this encounter.     Debbora Presto, FNP-C 01/03/2022, 11:05  AM [facility] [address], [place], [place] [address] [phone]

## 2022-01-08 ENCOUNTER — Telehealth: Payer: Self-pay | Admitting: Family Medicine

## 2022-01-08 NOTE — Telephone Encounter (Signed)
Referral sent to [facility].  Contact info: [phone]

## 2022-01-18 IMAGING — US US RENAL
1 series · 14 of 25 positions shown · non-contrast
Comparison: None.

CLINICAL DATA: 75-year-old female with kidney stone.

EXAM:
RENAL / URINARY TRACT ULTRASOUND COMPLETE

[Series 1: us renal · 14 of 27 slices shown]
[im 1/27]
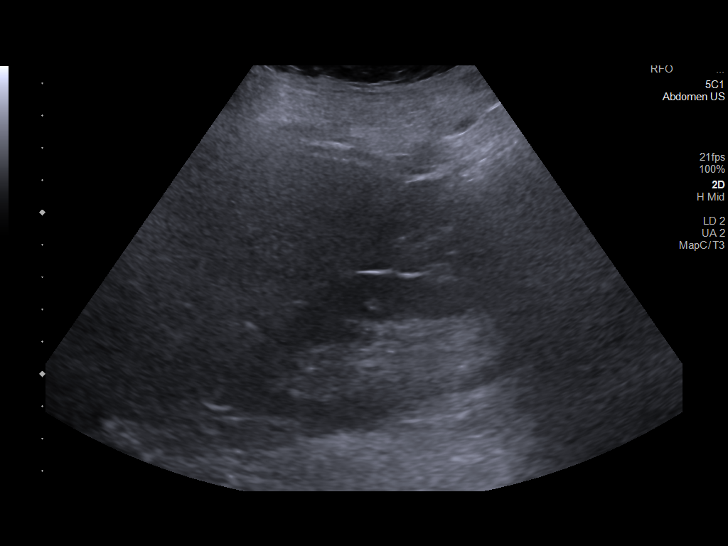
[im 3/27]
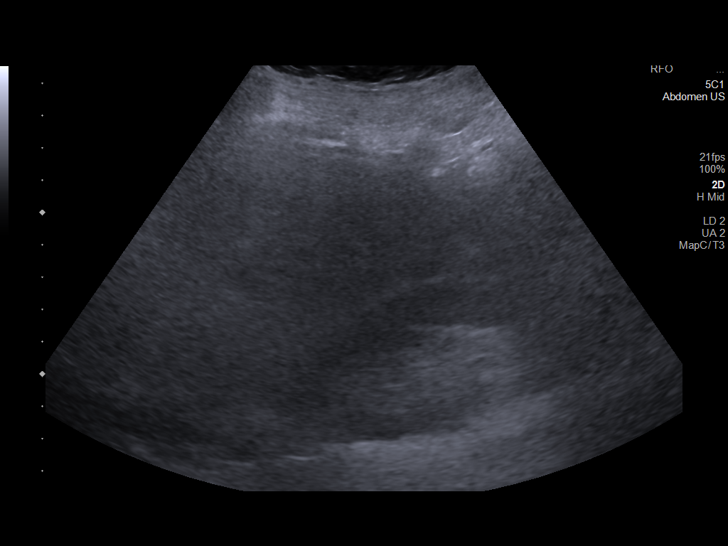
[im 5/27]
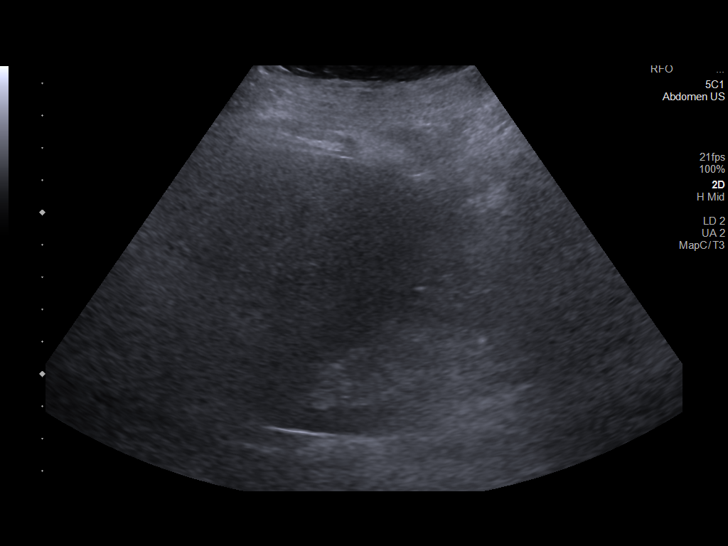
[im 7/27]
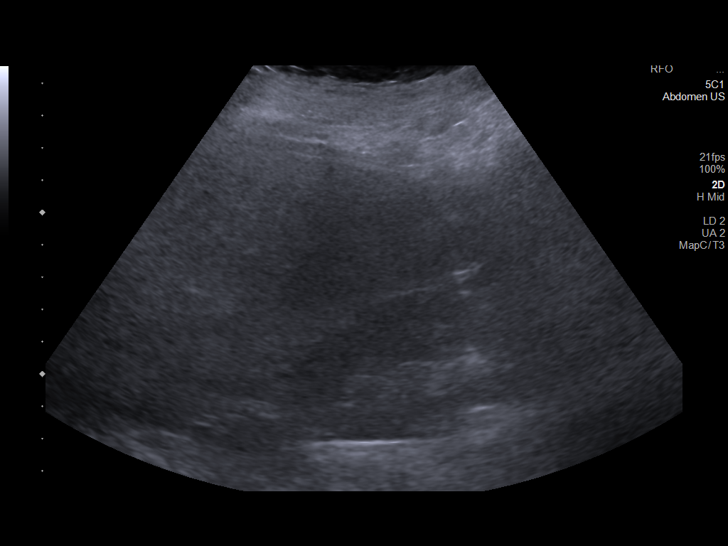
[im 9/27]
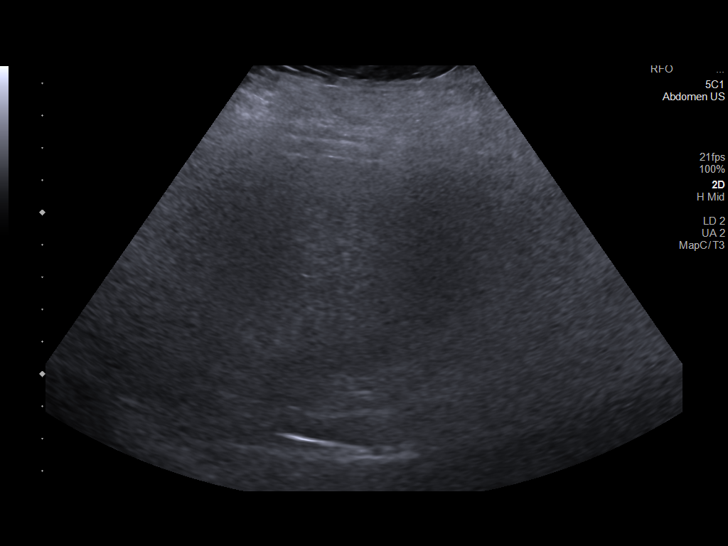
[im 10/27]
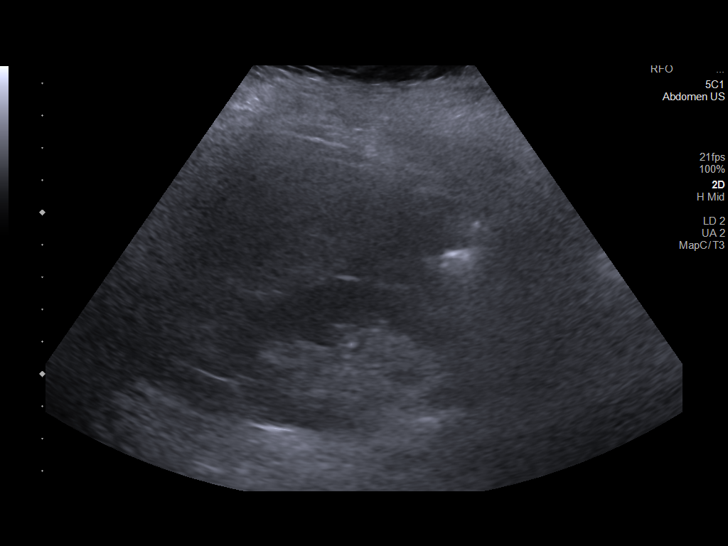
[im 12/27]
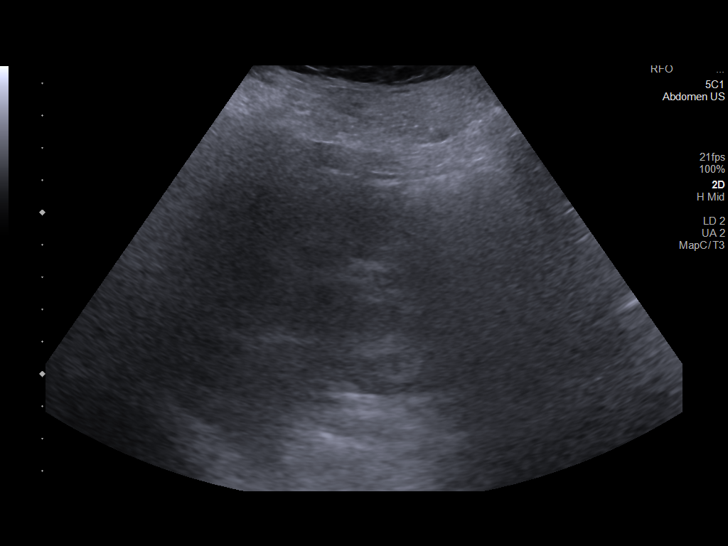
[im 15/27]
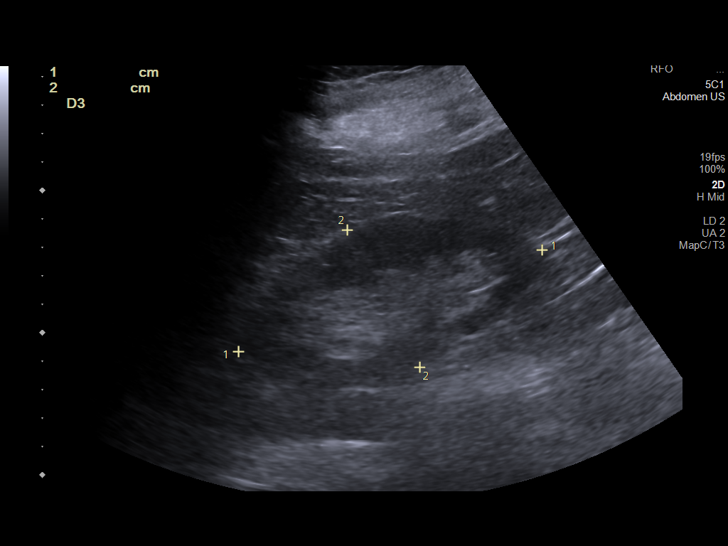
[im 17/27]
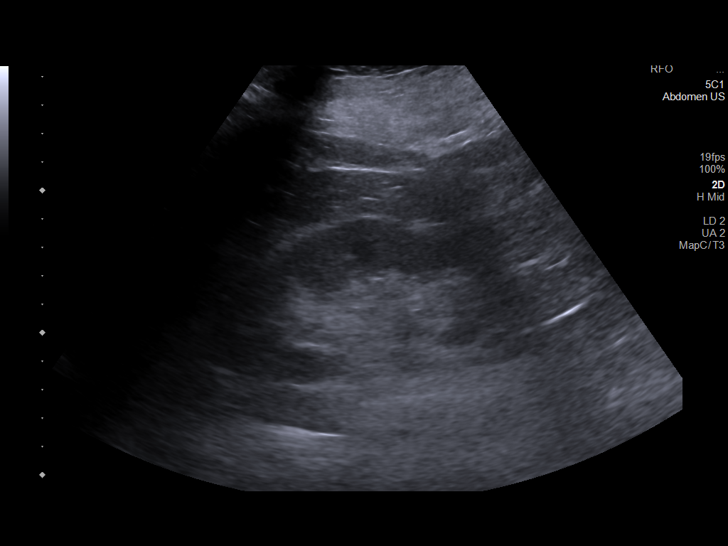
[im 18/27]
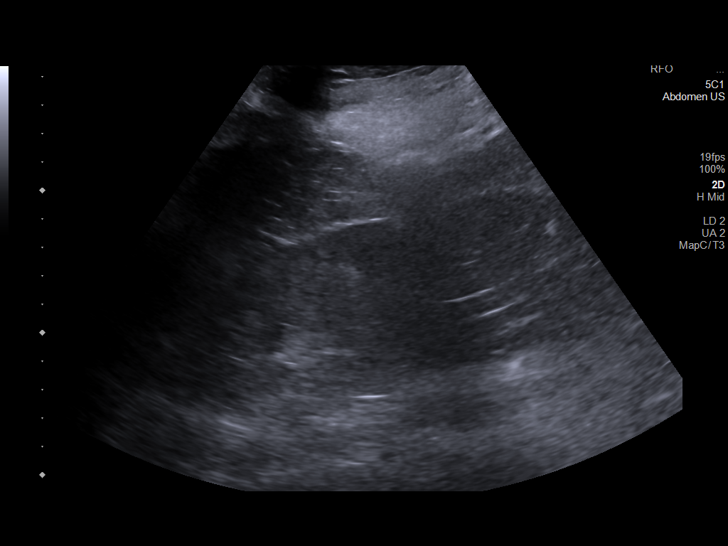
[im 20/27]
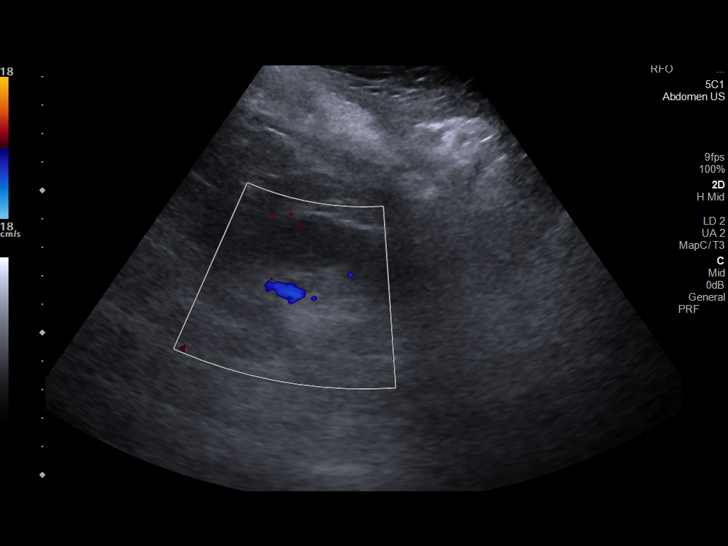
[im 22/27]
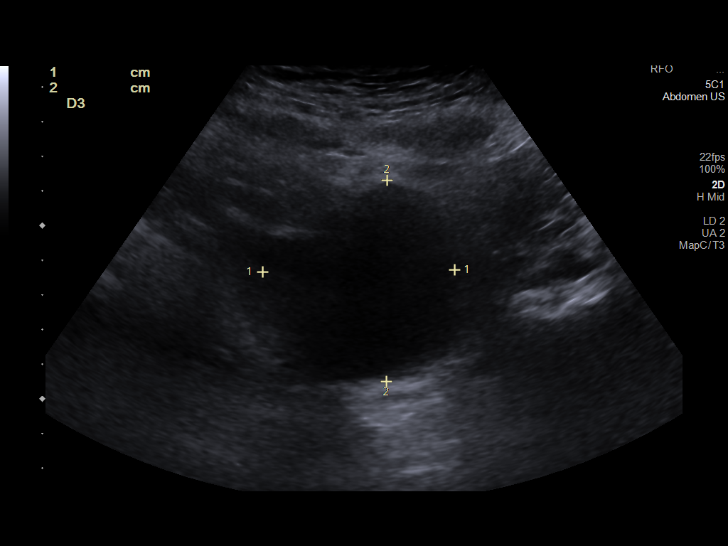
[im 24/27]
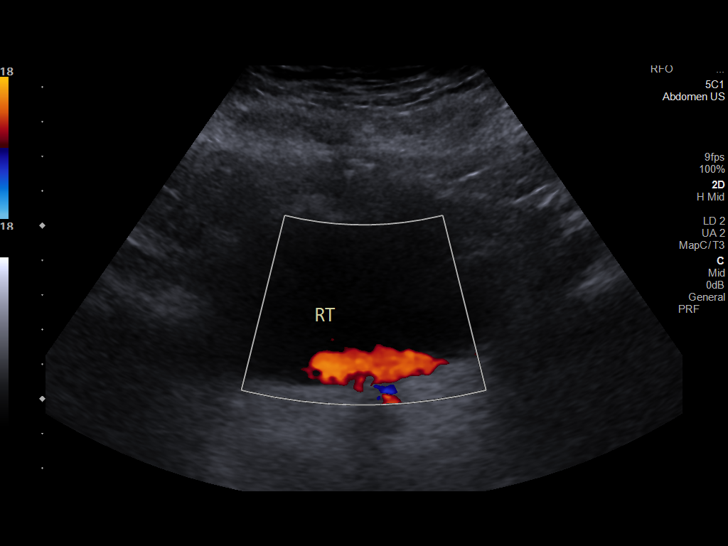
[im 27/27]
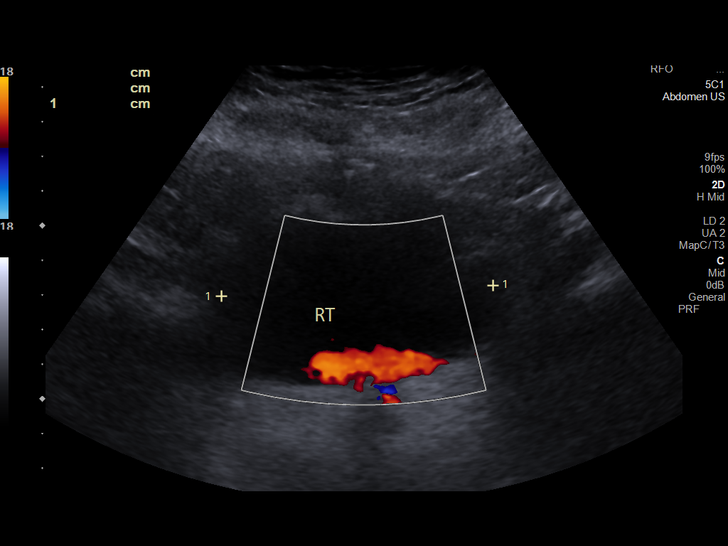

[14 of 25 positions shown; findings below may reference images not displayed]

FINDINGS: Right Kidney:

Renal measurements: 10.0 x 4.7 x 5.0 cm = volume: 121 mL.
Echogenicity within normal limits. No mass or hydronephrosis
visualized.

Left Kidney:

Renal measurements: 11.3 x 5.5 x 4.7 cm = volume: 152 mL.
Echogenicity within normal limits. No mass or hydronephrosis
visualized.

Bladder:

Appears normal for degree of bladder distention.

Other:

There is diffuse increased liver echogenicity most commonly seen in
the setting of fatty infiltration. Superimposed inflammation or
fibrosis is not excluded. Clinical correlation is recommended.
IMPRESSION: 1. Unremarkable kidneys and urinary bladder.
2. Fatty liver.

## 2022-01-25 ENCOUNTER — Other Ambulatory Visit: Payer: Self-pay

## 2022-01-25 MED ORDER — LOSARTAN POTASSIUM 50 MG PO TABS: 50.0000 mg | ORAL_TABLET | Freq: Two times a day (BID) | ORAL | 3 refills | Status: DC

## 2022-04-23 ENCOUNTER — Other Ambulatory Visit: Payer: Self-pay

## 2022-04-23 DIAGNOSIS — R350 Frequency of micturition: Secondary | ICD-10-CM

## 2022-04-23 NOTE — H&P (Signed)
TOTAL HIP ADMISSION H&P  Patient is admitted for left total hip arthroplasty.  Subjective:  Chief Complaint: Left hip pain  HPI: Julie Owen, 76 y.o. female, has a history of pain and functional disability in the left hip due to arthritis and patient has failed non-surgical conservative treatments for greater than 12 weeks to include NSAID's and/or analgesics, flexibility and strengthening excercises, and activity modification. Onset of symptoms was gradual, starting  several  years ago with gradually worsening course since that time. The patient noted no past surgery on the left hip. Patient currently rates pain in the left hip at 7 out of 10 with activity. Patient has night pain, worsening of pain with activity and weight bearing, and pain that interfers with activities of daily living. 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 This condition presents safety issues increasing the risk of falls. There is no current active infection.  Patient Active Problem List   Diagnosis Date Noted   Palpitations 11/19/2021   Dependence on CPAP ventilation 08/21/2021   PMR (polymyalgia rheumatica) ([place]) 08/21/2021   PFO (patent foramen ovale) 10/25/2020   Kidney stone 12/06/2019   Sicca ([place]) 08/15/2016   OSA on CPAP 06/10/2014   Compliance with medication regimen 06/10/2014   Snorings 11/04/2013   Nocturia 11/04/2013   Insomnia 11/04/2013   Diabetes mellitus ([place]) 01/10/2012   Essential hypertension, benign 04/23/2010   ATRIAL SEPTAL DEFECT, HX OF 04/23/2010    Past Medical History:  Diagnosis Date   Allergy    Anxiety    Atrial septal defect    Depression    Diabetes mellitus without complication ([place])    Hyperlipidemia    Hypertension    Neuromuscular disorder ([place])    neuropathy   Obesity    Osteoarthritis of lumbar spine    Osteoarthritis of thumb    bilateral   Prediabetes     Renal disorder    kidney stones   Sleep apnea    uses CPAP    Past Surgical History:  Procedure Laterality Date   BREAST REDUCTION SURGERY     BREAST SURGERY  2008   Right breast   BUNIONECTOMY  1993   Right foot   CATARACT EXTRACTION     CYSTOSCOPY W/ URETERAL STENT PLACEMENT Left 05/31/2015   Procedure: CYSTOSCOPY WITH STENT EXCHANGE;  Surgeon: Cleon Gustin, MD;  Location: [facility];  Service: Urology;  Laterality: Left;   CYSTOSCOPY WITH RETROGRADE PYELOGRAM, URETEROSCOPY AND STENT PLACEMENT Left 05/19/2015   Procedure: CYSTOSCOPY WITH RETROGRADE PYELOGRAM, URETEROSCOPY AND STENT PLACEMENT;  Surgeon: Cleon Gustin, MD;  Location: [facility];  Service: Urology;  Laterality: Left;   CYSTOSCOPY WITH RETROGRADE PYELOGRAM, URETEROSCOPY AND STENT PLACEMENT Left 02/14/2018   Procedure: CYSTOSCOPY WITH RETROGRADE PYELOGRAM, diagnostic URETEROSCOPY;  Surgeon: Alexis Frock, MD;  Location: [facility];  Service: Urology;  Laterality: Left;   CYSTOSCOPY/RETROGRADE/URETEROSCOPY/STONE EXTRACTION WITH BASKET Left 05/31/2015   Procedure: CYSTOSCOPY/RETROGRADE/STONE EXTRACTION WITH BASKET;  Surgeon: Cleon Gustin, MD;  Location: [facility];  Service: Urology;  Laterality: Left;   eyelid surgery Bilateral    Blephroplasty   REDUCTION MAMMAPLASTY Bilateral 2003   STONE EXTRACTION WITH BASKET Left 05/19/2015   Procedure: STONE EXTRACTION WITH BASKET;  Surgeon: Cleon Gustin, MD;  Location: [facility];  Service: Urology;  Laterality: Left;   TONSILLECTOMY     TOTAL HIP ARTHROPLASTY  08/2010   Right hip  Prior to Admission medications   Medication Sig Start Date End Date Taking? Authorizing Provider  antiseptic oral rinse (BIOTENE) LIQD 15 mLs by Mouth Rinse route as needed for dry mouth.    [provider]  Ascorbic Acid (VITAMIN C PO) Take by mouth.    [provider]  Calcium Carb-Cholecalciferol (CALCIUM 500 + D3 PO) Take by mouth.    [provider]  calcium-vitamin D  (OSCAL WITH D) 500-200 MG-UNIT tablet Take 1 tablet by mouth.    [provider]  ezetimibe (ZETIA) 10 MG tablet Take 10 mg by mouth daily.    [provider]  losartan (COZAAR) 50 MG tablet Take 1 tablet (50 mg total) by mouth 2 (two) times daily. 01/25/22   Minus Breeding, MD  metFORMIN (GLUCOPHAGE-XR) 500 MG 24 hr tablet Take 500 mg by mouth at bedtime.     [provider]  metroNIDAZOLE (METROGEL) 0.75 % gel Apply 1 application topically 2 (two) times daily.    [provider]  Misc. Devices MISC CPAP    [provider]  Polyethyl Glycol-Propyl Glycol (SYSTANE OP) Apply to eye.    [provider]  rosuvastatin (CRESTOR) 10 MG tablet Take by mouth. 11/28/21   [provider]  TURMERIC PO Take 1 tablet by mouth daily.    [provider]    Allergies  Allergen Reactions   Aspirin Other (See Comments)    Abdominal pain   Benzocaine    Caffeine     Panic attacks   Codeine Swelling   Duloxetine Hcl     Other reaction(s): Ineffective for depression   Lidocaine-Epinephrine Other (See Comments)   Oxycodone-Acetaminophen Swelling   Percocet [Oxycodone-Acetaminophen] Swelling   Pravastatin     myalgia   Procaine Hcl    Pseudoephedrine Hcl Er Other (See Comments)    Jittery, nervousness   Simvastatin     REACTION: hair loss   Venlafaxine     Other reaction(s): Ineffective for depression    Social History   Socioeconomic History   Marital status: Married    Spouse name: Gwyndolyn Saxon   Number of children: 0   Years of education: Master's   Highest education level: Not on file  Occupational History   Not on file  Tobacco Use   Smoking status: Former    Packs/day: 0.50    Years: 10.00    Total pack years: 5.00    Types: Cigarettes    Start date: 12/11/1965    Quit date: 07/30/1975    Years since quitting: 46.7   Smokeless tobacco: Never  Substance and Sexual Activity   Alcohol use: No    Alcohol/week: 0.0  standard drinks of alcohol   Drug use: No   Sexual activity: Not on file  Other Topics Concern   Not on file  Social History Narrative   Patient is married Gwyndolyn Saxon) and lives at home with her husband.   Patient is a retired Pharmacist, hospital.   Patient has a Master's degree.   Patient is right-handed.   Patient does not drink any caffeine.   Social Determinants of Health   Financial Resource Strain: Not on file  Food Insecurity: Not on file  Transportation Needs: Not on file  Physical Activity: Not on file  Stress: Not on file  Social Connections: Not on file  Intimate Partner Violence: Not on file    Tobacco Use: Medium Risk (01/03/2022)   Patient History    Smoking Tobacco Use: Former  Smokeless Tobacco Use: Never    Passive Exposure: Not on file   Social History   Substance and Sexual Activity  Alcohol Use No   Alcohol/week: 0.0 standard drinks of alcohol    Family History  Problem Relation Age of Onset   Hyperlipidemia Mother    Hypertension Mother    Heart disease Mother    Macular degeneration Father    Cancer Sister        colon   Hypertension Sister    Breast cancer Sister    Hypertension Other    Kidney disease Other    Stroke Other    Heart attack Other    Cancer Other    Obesity Other     Review of Systems  Constitutional:  Negative for chills and fever.  HENT: Negative.    Eyes: Negative.   Respiratory:  Negative for cough and shortness of breath.   Cardiovascular:  Negative for chest pain and palpitations.  Gastrointestinal:  Negative for abdominal pain, constipation, diarrhea, nausea and vomiting.  Genitourinary:  Positive for frequency and urgency. Negative for dysuria.  Musculoskeletal:  Positive for joint pain.  Skin:  Negative for rash.    Objective:  Physical Exam: Well nourished and well developed.  General: Alert and oriented x3, cooperative and pleasant, no acute distress.  Head: normocephalic, atraumatic, neck supple.  Eyes:  EOMI. Abdomen: non-tender to palpation and soft, normoactive bowel sounds. Musculoskeletal: The patient has a significantly antalgic gait pattern favoring the left side without the use of assistive devices.   Right Hip Exam:  Range of motion: Flexion to 110 degrees, internal rotation to 20 degrees, external rotation to 30 degrees, and abduction to 30 degrees without discomfort.   Left Hip Exam:  Range of motion: Flexion to 90 degrees, internal rotation to 0 degrees, external rotation to 0 degrees, and abduction to 20 degrees without discomfort.  There is positive tenderness over the greater trochanteric bursa.  Calves soft and nontender. Motor function intact in LE. Strength 5/5 LE bilaterally. Neuro: Distal pulses 2+. Sensation to light touch intact in LE.  Vital signs in last 24 hours: BP: ()/()  Arterial Line BP: ()/()   Imaging Review Plain radiographs demonstrate moderate degenerative joint disease of the left hip. The bone quality appears to be adequate for age and reported activity level.  Assessment/Plan:  End stage arthritis, left hip  The patient history, physical examination, clinical judgement of the provider and imaging studies are consistent with end stage degenerative joint disease of the left hip and total hip arthroplasty is deemed medically necessary. The treatment options including medical management, injection therapy, arthroscopy and arthroplasty were discussed at length. The risks and benefits of total hip arthroplasty were presented and reviewed. The risks due to aseptic loosening, infection, stiffness, dislocation/subluxation, thromboembolic complications and other imponderables were discussed. The patient acknowledged the explanation, agreed to proceed with the plan and consent was signed. Patient is being admitted for inpatient treatment for surgery, pain control, PT, OT, prophylactic antibiotics, VTE prophylaxis, progressive ambulation and ADLs and discharge  planning.The patient is planning to be discharged  home .  Therapy Plans: HEP Disposition: Home with Husband Planned DVT Prophylaxis: Xarelto 10 mg (intolerant of aspirin) DME Needed: RW PCP: Bevelyn Buckles, MD (clearance received) Cardiologist: Minus Breeding, MD (patient contacting for clearance) TXA: IV Allergies: aspirin (abdominal pain), codeine (angioedema, hives), epinephrine, marcaine Anesthesia Concerns: uses CPAP BMI: 32.4 Last HgbA1c: unsure - will recheck with pre-op labs  Pharmacy: [facility] ([address], [place])  Other: -Given clearance form to take to cardiologist -Hx of PMR - currently taking 5 mg, will need stress dose after surgery -Discussed dilaudid and methocarbamol post-op  - Patient was instructed on what medications to stop prior to surgery. - Follow-up visit in 2 weeks with Dr. Wynelle Link - Begin physical therapy following surgery - Pre-operative lab work as pre-surgical testing - Prescriptions will be provided in hospital at time of discharge  R. Jaynie Bream, PA-C Orthopedic Surgery [facility] [place]

## 2022-04-24 ENCOUNTER — Other Ambulatory Visit: Payer: Medicare Other

## 2022-04-24 DIAGNOSIS — R350 Frequency of micturition: Secondary | ICD-10-CM

## 2022-04-24 LAB — URINALYSIS, ROUTINE W REFLEX MICROSCOPIC
Bilirubin, UA: NEGATIVE
Glucose, UA: NEGATIVE
Ketones, UA: NEGATIVE
Nitrite, UA: POSITIVE — AB
Specific Gravity, UA: 1.02 (ref 1.005–1.030)
Urobilinogen, Ur: 0.2 mg/dL (ref 0.2–1.0)
pH, UA: 5 (ref 5.0–7.5)

## 2022-04-24 LAB — MICROSCOPIC EXAMINATION: WBC, UA: >30 /hpf — AB (ref 0–5)

## 2022-04-28 LAB — URINE CULTURE

## 2022-04-29 ENCOUNTER — Telehealth: Payer: Self-pay | Admitting: Cardiology

## 2022-04-29 ENCOUNTER — Telehealth: Payer: Self-pay | Admitting: *Deleted

## 2022-04-29 ENCOUNTER — Telehealth: Payer: Self-pay

## 2022-04-29 MED ORDER — AMOXICILLIN-POT CLAVULANATE 875-125 MG PO TABS: 1.0000 | ORAL_TABLET | Freq: Two times a day (BID) | ORAL | 0 refills | Status: DC

## 2022-04-29 NOTE — Telephone Encounter (Signed)
-----   Message from Dorisann Frames, RN sent at 04/29/2022 11:13 AM EST ----- No treatment started

## 2022-04-29 NOTE — Telephone Encounter (Signed)
Pt has been scheduled for tele pre op appt 05/02/22 @ 10 am. Med rec and consent are done.     Patient Consent for Virtual Visit        Julie Owen has provided verbal consent on 04/29/2022 for a virtual visit (video or telephone).   CONSENT FOR VIRTUAL VISIT FOR:  Julie Owen  By participating in this virtual visit I agree to the following:  I hereby voluntarily request, consent and authorize [facility] and its employed or contracted physicians, physician assistants, nurse practitioners or other licensed health care professionals (the Practitioner), to provide me with telemedicine health care services (the "Services") as deemed necessary by the treating Practitioner. I acknowledge and consent to receive the Services by the Practitioner via telemedicine. I understand that the telemedicine visit will involve communicating with the Practitioner through live audiovisual communication technology and the disclosure of certain medical information by electronic transmission. I acknowledge that I have been given the opportunity to request an in-person assessment or other available alternative prior to the telemedicine visit and am voluntarily participating in the telemedicine visit.  I understand that I have the right to withhold or withdraw my consent to the use of telemedicine in the course of my care at any time, without affecting my right to future care or treatment, and that the Practitioner or I may terminate the telemedicine visit at any time. I understand that I have the right to inspect all information obtained and/or recorded in the course of the telemedicine visit and may receive copies of available information for a reasonable fee.  I understand that some of the potential risks of receiving the Services via telemedicine include:  Delay or interruption in medical evaluation due to technological equipment failure or disruption; Information transmitted may not be sufficient (e.g.  poor resolution of images) to allow for appropriate medical decision making by the Practitioner; and/or  In rare instances, security protocols could fail, causing a breach of personal health information.  Furthermore, I acknowledge that it is my responsibility to provide information about my medical history, conditions and care that is complete and accurate to the best of my ability. I acknowledge that Practitioner's advice, recommendations, and/or decision may be based on factors not within their control, such as incomplete or inaccurate data provided by me or distortions of diagnostic images or specimens that may result from electronic transmissions. I understand that the practice of medicine is not an exact science and that Practitioner makes no warranties or guarantees regarding treatment outcomes. I acknowledge that a copy of this consent can be made available to me via my patient portal ([facility]), or I can request a printed copy by calling the office of [facility].    I understand that my insurance will be billed for this visit.   I have read or had this consent read to me. I understand the contents of this consent, which adequately explains the benefits and risks of the Services being provided via telemedicine.  I have been provided ample opportunity to ask questions regarding this consent and the Services and have had my questions answered to my satisfaction. I give my informed consent for the services to be provided through the use of telemedicine in my medical care

## 2022-04-29 NOTE — Telephone Encounter (Signed)
   Name: Julie Owen  DOB: May 12, 1946  MRN: [id number]  Primary Cardiologist: Minus Breeding, MD   Preoperative team, please contact this patient and set up a phone call appointment for further preoperative risk assessment. Please obtain consent and complete medication review. Thank you for your help.  I confirm that guidance regarding antiplatelet and oral anticoagulation therapy has been completed and, if necessary, noted below. She does not appear to be on an antiplatelet or anticoagulation therapy.   Darreld Mclean, PA-C 04/29/2022, 11:34 AM [place]

## 2022-04-29 NOTE — Telephone Encounter (Signed)
Reviewed urine culture with Dr. Alyson Ingles, new order for Augmentin '875mg'$  BID x 7 days. Prescription sent to pharmacy   Called and left message to return call to office.

## 2022-04-29 NOTE — Telephone Encounter (Signed)
Pt has been scheduled for tele pre op appt 05/02/22 @ 10 am. Med rec and consent are done.

## 2022-04-29 NOTE — Telephone Encounter (Signed)
   Pre-operative Risk Assessment    Patient Name: Julie Owen  DOB: 1945/12/29 MRN: [id number]      Request for Surgical Clearance    Procedure:   Left total hip arthroplasty   Date of Surgery:  Clearance 05/15/22                                 Surgeon:  Dr. Wynelle Link  Surgeon's Group or Practice Name:  [facility]  Phone number:  [phone] Fax number:  [phone]    Type of Clearance Requested:   Medical    Type of Anesthesia:   choice    Additional requests/questions:    Dorthey Sawyer   04/29/2022, 10:25 AM

## 2022-05-02 ENCOUNTER — Ambulatory Visit: Payer: Medicare Other | Attending: Internal Medicine | Admitting: General Practice

## 2022-05-02 ENCOUNTER — Telehealth: Payer: Self-pay

## 2022-05-02 DIAGNOSIS — Z01810 Encounter for preprocedural cardiovascular examination: Secondary | ICD-10-CM

## 2022-05-02 MED ORDER — FOSFOMYCIN TROMETHAMINE 3 G PO PACK: 3.0000 g | PACK | Freq: Once | ORAL | 0 refills | Status: DC

## 2022-05-02 NOTE — Addendum Note (Signed)
Addended by: Iris Pert on: 05/02/2022 12:04 PM   Modules accepted: Orders

## 2022-05-02 NOTE — Telephone Encounter (Signed)
Patient states after she took 1 Augmentin she develop difficulty breathing, sob, and diarrhea.  Patient states symptoms has since resolved and she has not taken anymore.   Please advise on alternative.

## 2022-05-02 NOTE — Telephone Encounter (Signed)
Patient called and made aware a new antibiotic was sent in to pharmacy for patient.

## 2022-05-02 NOTE — Progress Notes (Signed)
Virtual Visit via Telephone Note   Because of Julie Owen's co-morbid illnesses, she is at least at moderate risk for complications without adequate follow up.  This format is felt to be most appropriate for this patient at this time.  The patient did not have access to video technology/had technical difficulties with video requiring transitioning to audio format only (telephone).  All issues noted in this document were discussed and addressed.  No physical exam could be performed with this format.  Please refer to the patient's chart for her consent to telehealth for Julie Owen.  Evaluation Performed:  Preoperative cardiovascular risk assessment _____________   Date:  05/02/2022   Patient ID:  Julie Owen, DOB 04-20-1946, MRN [id number] Patient Location:  Home Provider location:   Office  Primary Care Provider:  Donnajean Lopes, MD Primary Cardiologist:  Minus Breeding, MD  Chief Complaint / Patient Profile   76 y.o. y/o female with a h/o ASD, HTN, palpitations who is pending left total hip arthroplasty and presents today for telephonic preoperative cardiovascular risk assessment.  History of Present Illness    Julie Owen is a 76 y.o. female who presents via audio/video conferencing for a telehealth visit today.  Pt was last seen in cardiology clinic on 11/21/2021 by Dr. Percival Spanish.  At that time Julie Owen was doing well .  The patient is now pending procedure as outlined above. Since her last visit, she remained stable for cardiac.  Today she denies chest pain, shortness of breath, lower extremity edema, fatigue, palpitations, melena, hematuria, hemoptysis, diaphoresis, weakness, presyncope, syncope, orthopnea, and PND.   Past Medical History    Past Medical History:  Diagnosis Date   Allergy    Anxiety    Atrial septal defect    Depression    Diabetes mellitus without complication ([facility])    Hyperlipidemia    Hypertension    Neuromuscular disorder  ([facility])    neuropathy   Obesity    Osteoarthritis of lumbar spine    Osteoarthritis of thumb    bilateral   Prediabetes    Renal disorder    kidney stones   Sleep apnea    uses CPAP   Past Surgical History:  Procedure Laterality Date   BREAST REDUCTION SURGERY     BREAST SURGERY  2008   Right breast   BUNIONECTOMY  1993   Right foot   CATARACT EXTRACTION     CYSTOSCOPY W/ URETERAL STENT PLACEMENT Left 05/31/2015   Procedure: CYSTOSCOPY WITH STENT EXCHANGE;  Surgeon: Cleon Gustin, MD;  Location: [facility];  Service: Urology;  Laterality: Left;   CYSTOSCOPY WITH RETROGRADE PYELOGRAM, URETEROSCOPY AND STENT PLACEMENT Left 05/19/2015   Procedure: CYSTOSCOPY WITH RETROGRADE PYELOGRAM, URETEROSCOPY AND STENT PLACEMENT;  Surgeon: Cleon Gustin, MD;  Location: [facility];  Service: Urology;  Laterality: Left;   CYSTOSCOPY WITH RETROGRADE PYELOGRAM, URETEROSCOPY AND STENT PLACEMENT Left 02/14/2018   Procedure: CYSTOSCOPY WITH RETROGRADE PYELOGRAM, diagnostic URETEROSCOPY;  Surgeon: Alexis Frock, MD;  Location[facility];  Service: Urology;  Laterality: Left;   CYSTOSCOPY/RETROGRADE/URETEROSCOPY/STONE EXTRACTION WITH BASKET Left 05/31/2015   Procedure: CYSTOSCOPY/RETROGRADE/STONE EXTRACTION WITH BASKET;  Surgeon: Cleon Gustin, MD;  Location: [facility];  Service: Urology;  Laterality: Left;   eyelid surgery Bilateral    Blephroplasty   REDUCTION MAMMAPLASTY Bilateral 2003   STONE EXTRACTION WITH BASKET Left 05/19/2015   Procedure: STONE EXTRACTION WITH BASKET;  Surgeon: Cleon Gustin, MD;  Location: [facility];  Service: Urology;  Laterality: Left;   TONSILLECTOMY     TOTAL HIP ARTHROPLASTY  08/2010   Right hip    Allergies  Allergies  Allergen Reactions   Aspirin Other (See Comments)    Abdominal pain   Benzocaine    Caffeine     Panic attacks   Codeine Swelling   Duloxetine Hcl     Other reaction(s): Ineffective for depression   Lidocaine-Epinephrine Other (See Comments)    Oxycodone-Acetaminophen Swelling   Percocet [Oxycodone-Acetaminophen] Swelling   Pravastatin     myalgia   Procaine Hcl    Pseudoephedrine Hcl Er Other (See Comments)    Jittery, nervousness   Simvastatin     REACTION: hair loss   Venlafaxine     Other reaction(s): Ineffective for depression    Home Medications    Prior to Admission medications   Medication Sig Start Date End Date Taking? Authorizing Provider  amoxicillin-clavulanate (AUGMENTIN) 875-125 MG tablet Take 1 tablet by mouth every 12 (twelve) hours. 04/29/22   McKenzie, Candee Furbish, MD  antiseptic oral rinse (BIOTENE) LIQD 15 mLs by Mouth Rinse route as needed for dry mouth.    [provider]  Ascorbic Acid (VITAMIN C PO) Take by mouth.    [provider]  Calcium Carb-Cholecalciferol (CALCIUM 500 + D3 PO) Take by mouth.    [provider]  calcium-vitamin D (OSCAL WITH D) 500-200 MG-UNIT tablet Take 1 tablet by mouth.    [provider]  ezetimibe (ZETIA) 10 MG tablet Take 10 mg by mouth daily.    [provider]  losartan (COZAAR) 50 MG tablet Take 1 tablet (50 mg total) by mouth 2 (two) times daily. 01/25/22   Minus Breeding, MD  metFORMIN (GLUCOPHAGE-XR) 500 MG 24 hr tablet Take 500 mg by mouth at bedtime.     [provider]  metroNIDAZOLE (METROGEL) 0.75 % gel Apply 1 application topically 2 (two) times daily.    [provider]  Misc. Devices MISC CPAP    [provider]  Polyethyl Glycol-Propyl Glycol (SYSTANE OP) Apply to eye.    [provider]  rosuvastatin (CRESTOR) 10 MG tablet Take by mouth. 11/28/21   [provider]  TURMERIC PO Take 1 tablet by mouth daily.    [provider]    Physical Exam    Vital Signs:  Julie Owen does not have vital signs available for review today.  Given telephonic nature of communication, physical exam is limited. AAOx3. NAD. Normal affect.  Speech and respirations are  unlabored.  Accessory Clinical Findings    None  Assessment & Plan    1.  Preoperative Cardiovascular Risk Assessment:Left total hip arthroplasty , Dr. Wynelle Link       Primary Cardiologist: Minus Breeding, MD  Chart reviewed as part of pre-operative protocol coverage. Given past medical history and time since last visit, based on ACC/AHA guidelines, Julie Owen would be at acceptable risk for the planned procedure without further cardiovascular testing.   Patient was advised that if she develops new symptoms prior to surgery to contact our office to arrange a follow-up appointment.  She verbalized understanding.  Her RCRI is a class I risk, 0.4% risk of major cardiac event.  She is able to complete greater than 4 METS of physical activity.  I will route this recommendation to the requesting party via Epic fax function and remove from pre-op pool.       Time:   Today, I have spent 11 minutes  with the patient with telehealth technology discussing medical history, symptoms, and management plan.  Prior to her phone evaluation I spent greater than 10 minutes reviewing her past medical history and cardiac medications.   Deberah Pelton, NP  05/02/2022, 9:15 AM

## 2022-05-06 NOTE — Progress Notes (Addendum)
COVID Vaccine received:  [x]  No []  Yes Date of any COVID positive Test in last 90 days:  none  PCP - Jarome Matin, MD Cardiologist - Rollene Rotunda, MD Edd Fabian, NP Cardiac clearance 05-02-22 note Rheumatology-  Domenica Fail  Chest x-ray -  EKG - 11-21-2021  Stress Test -  ECHO - 05-12-2017  Bubble Study Cardiac Cath -   PCR screen: [x]  Ordered & Completed                      []   No Order but Needs PROFEND                      []   N/A for this surgery  Surgery Plan:  []  Ambulatory                            [x]  Outpatient in bed                            []  Admit  Anesthesia:    []  General  []  Spinal                           [x]   Choice []   MAC  Pacemaker / ICD device [x]  No []  Yes        Device order form faxed [x]  No    []   Yes      Faxed to:  Spinal Cord Stimulator:[x]  No []  Yes      (Remind patient to bring remote DOS) Other Implants:   History of Sleep Apnea? []  No [x]  Yes   CPAP used?- []  No [x]  Yes    PRE-DM Does the patient monitor blood sugar? [x]  No []  Yes  []  N/A Does patient have a [employer] or Dexacom? [x]  No []  Yes   Fasting Blood Sugar Ranges-  Checks Blood Sugar __0_ times a day Metformin XL 500mg - You may take Metformin the day before your surgery. DO NOT take it the day OF your surgery (05-15-2022)  Blood Thinner / Instructions:None Aspirin Instructions: None  ERAS Protocol Ordered: []  No  [x]  Yes PRE-SURGERY []  ENSURE  [x]  G2  Patient is to be NPO after: 07:15 am  Comments: Patient reports having an UTI and was prescribed antibiotics last week. She has not been able to start these yet d/t being intolerant to Augmentin and also her insurance did not approve of an alternate medication. Mrs Mendias is allergic/ intolerant to over 15 medications.  I instructed her to call Dr. Deri Fuelling office today and let them know that she has not had any medications to treat her UTI. She voiced understanding of the importance of calling them  asap.   Activity level: Patient can not climb a flight of stairs without difficulty; [x]  No CP  but would have SOB, leg pain.  Patient can perform ADLs without assistance.    Anesthesia review: PRE-DM, OSA (CPAP), HTN, ASD, small PFO, SICCA, polymyalgia Rheumatica, Anxiety  Patient denies shortness of breath, fever, cough and chest pain at PAT appointment.  Patient verbalized understanding and agreement to the Pre-Surgical Instructions that were given to them at this PAT appointment. Patient was also educated of the need to review these PAT instructions again prior to his/her surgery.I reviewed the appropriate phone numbers to call if they have any and questions or concerns.

## 2022-05-06 NOTE — Patient Instructions (Signed)
SURGICAL WAITING ROOM VISITATION Patients having surgery or a procedure may have no more than 2 support people in the waiting area - these visitors may rotate in the visitor waiting room.   Due to an increase in RSV and influenza rates and associated hospitalizations, children ages 35 and under may not visit patients in [place]. If the patient needs to stay at the hospital during part of their recovery, the visitor guidelines for inpatient rooms apply.  PRE-OP VISITATION  Pre-op nurse will coordinate an appropriate time for 1 support person to accompany the patient in pre-op.  This support person may not rotate.  This visitor will be contacted when the time is appropriate for the visitor to come back in the pre-op area.  Please refer to the [facility] website for the visitor guidelines for Inpatients (after your surgery is over and you are in a regular room).  You are not required to quarantine at this time prior to your surgery. However, you must do this: Hand Hygiene often Do NOT share personal items Notify your provider if you are in close contact with someone who has COVID or you develop fever 100.4 or greater, new onset of sneezing, cough, sore throat, shortness of breath or body aches.  If you test positive for Covid or have been in contact with anyone that has tested positive in the last 10 days please notify you surgeon.    Your procedure is scheduled on:  Wednesday  May 15, 2022  Report to [facility] Main Entrance: [address] entrance where the [employer] is available.   Report to admitting at:07:45  AM  +++++Call this number if you have any questions or problems the morning of surgery [phone]  Do not eat food after Midnight the night prior to your surgery/procedure.  After Midnight you may have the following liquids until  07:15 AM  DAY OF SURGERY  Clear Liquid Diet Water Black Coffee (sugar ok, NO MILK/CREAM OR CREAMERS)  Tea (sugar ok, NO  MILK/CREAM OR CREAMERS) regular and decaf                             Plain Jell-O  with no fruit (NO RED)                                           Fruit ices (not with fruit pulp, NO RED)                                     Popsicles (NO RED)                                                                  Juice: apple, WHITE grape, WHITE cranberry Sports drinks like Gatorade or Powerade (NO RED)                    The day of surgery:  Drink ONE (1) Pre-Surgery G2 at  07:15  AM the morning of surgery. Drink in one sitting. Do not  sip.  This drink was given to you during your hospital pre-op appointment visit. Nothing else to drink after completing the Pre-Surgery G2 : No candy, chewing gum or throat lozenges.    FOLLOW ANY ADDITIONAL PRE OP INSTRUCTIONS YOU RECEIVED FROM YOUR SURGEON'S OFFICE!!!   Oral Hygiene is also important to reduce your risk of infection.        Remember - BRUSH YOUR TEETH THE MORNING OF SURGERY WITH YOUR REGULAR TOOTHPASTE  Take ONLY these medicines the morning of surgery  you may use Restasis Eye drops and Systane eye drops if needed.    If You have been diagnosed with Sleep Apnea - Bring CPAP mask and tubing day of surgery. We will provide you with a CPAP machine on the day of your surgery.                   You may not have any metal on your body including hair pins, jewelry, and body piercing  Do not wear make-up, lotions, powders, perfumes or deodorant  Do not wear nail polish including gel and S&S, artificial / acrylic nails, or any other type of covering on natural nails including finger and toenails. If you have artificial nails, gel coating, etc., that needs to be removed by a nail salon, Please have this removed prior to surgery. Not doing so may mean that your surgery could be cancelled or delayed if the Surgeon or anesthesia staff feels like they are unable to monitor you safely.   Do not shave 48 hours prior to surgery to avoid nicks in your  skin which may contribute to postoperative infections.   Contacts, Hearing Aids, dentures or bridgework may not be worn into surgery. DENTURES WILL BE REMOVED PRIOR TO SURGERY PLEASE DO NOT APPLY "Poly grip" OR ADHESIVES!!!  You may bring a small overnight bag with you on the day of surgery, only pack items that are not valuable. [facility] IS NOT RESPONSIBLE   FOR VALUABLES THAT ARE LOST OR STOLEN.   Do not bring your home medications to the hospital. The Pharmacy will dispense medications listed on your medication list to you during your admission in the Hospital.  Special Instructions: Bring a copy of your healthcare power of attorney and living will documents the day of surgery, if you wish to have them scanned into your [facility] Medical Records- EPIC  Please read over the following fact sheets you were given: IF YOU HAVE QUESTIONS ABOUT YOUR PRE-OP INSTRUCTIONS, PLEASE CALL [phone]  ([place])   [place] - Preparing for Surgery Before surgery, you can play an important role.  Because skin is not sterile, your skin needs to be as free of germs as possible.  You can reduce the number of germs on your skin by washing with CHG (chlorahexidine gluconate) soap before surgery.  CHG is an antiseptic cleaner which kills germs and bonds with the skin to continue killing germs even after washing. Please DO NOT use if you have an allergy to CHG or antibacterial soaps.  If your skin becomes reddened/irritated stop using the CHG and inform your nurse when you arrive at Short Stay. Do not shave (including legs and underarms) for at least 48 hours prior to the first CHG shower.  You may shave your face/neck.  Please follow these instructions carefully:  1.  Shower with CHG Soap the night before surgery and the  morning of surgery.  2.  If you choose to wash your hair, wash your hair  first as usual with your normal  shampoo.  3.  After you shampoo, rinse your hair and body thoroughly to remove the  shampoo.                             4.  Use CHG as you would any other liquid soap.  You can apply chg directly to the skin and wash.  Gently with a scrungie or clean washcloth.  5.  Apply the CHG Soap to your body ONLY FROM THE NECK DOWN.   Do not use on face/ open                           Wound or open sores. Avoid contact with eyes, ears mouth and genitals (private parts).                       Wash face,  Genitals (private parts) with your normal soap.             6.  Wash thoroughly, paying special attention to the area where your  surgery  will be performed.  7.  Thoroughly rinse your body with warm water from the neck down.  8.  DO NOT shower/wash with your normal soap after using and rinsing off the CHG Soap.            9.  Pat yourself dry with a clean towel.            10.  Wear clean pajamas.            11.  Place clean sheets on your bed the night of your first shower and do not  sleep with pets.  ON THE DAY OF SURGERY : Do not apply any lotions/deodorants the morning of surgery.  Please wear clean clothes to the hospital/surgery center.    FAILURE TO FOLLOW THESE INSTRUCTIONS MAY RESULT IN THE CANCELLATION OF YOUR SURGERY  PATIENT SIGNATURE_________________________________  NURSE SIGNATURE__________________________________  ________________________________________________________________________       Adam Phenix    An incentive spirometer is a tool that can help keep your lungs clear and active. This tool measures how well you are filling your lungs with each breath. Taking long deep breaths may help reverse or decrease the chance of developing breathing (pulmonary) problems (especially infection) following: A long period of time when you are unable to move or be active. BEFORE THE PROCEDURE  If the spirometer includes an indicator to show your best effort, your nurse or respiratory therapist will set it to a desired goal. If possible, sit up straight or  lean slightly forward. Try not to slouch. Hold the incentive spirometer in an upright position. INSTRUCTIONS FOR USE  Sit on the edge of your bed if possible, or sit up as far as you can in bed or on a chair. Hold the incentive spirometer in an upright position. Breathe out normally. Place the mouthpiece in your mouth and seal your lips tightly around it. Breathe in slowly and as deeply as possible, raising the piston or the ball toward the top of the column. Hold your breath for 3-5 seconds or for as long as possible. Allow the piston or ball to fall to the bottom of the column. Remove the mouthpiece from your mouth and breathe out normally. Rest for a few seconds and repeat Steps 1 through 7 at least 10 times every  1-2 hours when you are awake. Take your time and take a few normal breaths between deep breaths. The spirometer may include an indicator to show your best effort. Use the indicator as a goal to work toward during each repetition. After each set of 10 deep breaths, practice coughing to be sure your lungs are clear. If you have an incision (the cut made at the time of surgery), support your incision when coughing by placing a pillow or rolled up towels firmly against it. Once you are able to get out of bed, walk around indoors and cough well. You may stop using the incentive spirometer when instructed by your caregiver.  RISKS AND COMPLICATIONS Take your time so you do not get dizzy or light-headed. If you are in pain, you may need to take or ask for pain medication before doing incentive spirometry. It is harder to take a deep breath if you are having pain. AFTER USE Rest and breathe slowly and easily. It can be helpful to keep track of a log of your progress. Your caregiver can provide you with a simple table to help with this. If you are using the spirometer at home, follow these instructions: [place] IF:  You are having difficultly using the spirometer. You have  trouble using the spirometer as often as instructed. Your pain medication is not giving enough relief while using the spirometer. You develop fever of 100.5 F (38.1 C) or higher.                                                                                                    SEEK IMMEDIATE MEDICAL CARE IF:  You cough up bloody sputum that had not been present before. You develop fever of 102 F (38.9 C) or greater. You develop worsening pain at or near the incision site. MAKE SURE YOU:  Understand these instructions. Will watch your condition. Will get help right away if you are not doing well or get worse. Document Released: 09/09/2006 Document Revised: 07/22/2011 Document Reviewed: 11/10/2006 [facility] Patient Information 2014 [place], [place].     WHAT IS A BLOOD TRANSFUSION? Blood Transfusion Information  A transfusion is the replacement of blood or some of its parts. Blood is made up of multiple cells which provide different functions. Red blood cells carry oxygen and are used for blood loss replacement. White blood cells fight against infection. Platelets control bleeding. Plasma helps clot blood. Other blood products are available for specialized needs, such as hemophilia or other clotting disorders.  BEFORE THE TRANSFUSION  Who gives blood for transfusions?  Healthy volunteers who are fully evaluated to make sure their blood is safe. This is blood bank blood. Transfusion therapy is the safest it has ever been in the practice of medicine. Before blood is taken from a donor, a complete history is taken to make sure that person has no history of diseases nor engages in risky social behavior (examples are intravenous drug use or sexual activity with multiple partners). The donor's travel history is screened to minimize risk of transmitting infections, such as malaria. The  donated blood is tested for signs of infectious diseases, such as HIV and hepatitis. The blood is then tested  to be sure it is compatible with you in order to minimize the chance of a transfusion reaction. If you or a relative donates blood, this is often done in anticipation of surgery and is not appropriate for emergency situations. It takes many days to process the donated blood. RISKS AND COMPLICATIONS Although transfusion therapy is very safe and saves many lives, the main dangers of transfusion include:  Getting an infectious disease. Developing a transfusion reaction. This is an allergic reaction to something in the blood you were given. Every precaution is taken to prevent this. The decision to have a blood transfusion has been considered carefully by your caregiver before blood is given. Blood is not given unless the benefits outweigh the risks. AFTER THE TRANSFUSION Right after receiving a blood transfusion, you will usually feel much better and more energetic. This is especially true if your red blood cells have gotten low (anemic). The transfusion raises the level of the red blood cells which carry oxygen, and this usually causes an energy increase. The nurse administering the transfusion will monitor you carefully for complications. HOME CARE INSTRUCTIONS  No special instructions are needed after a transfusion. You may find your energy is better. Speak with your caregiver about any limitations on activity for underlying diseases you may have. SEEK MEDICAL CARE IF:  Your condition is not improving after your transfusion. You develop redness or irritation at the intravenous (IV) site. SEEK IMMEDIATE MEDICAL CARE IF:  Any of the following symptoms occur over the next 12 hours: Shaking chills. You have a temperature by mouth above 102 F (38.9 C), not controlled by medicine. Chest, back, or muscle pain. People around you feel you are not acting correctly or are confused. Shortness of breath or difficulty breathing. Dizziness and fainting. You get a rash or develop hives. You have a decrease  in urine output. Your urine turns a dark color or changes to pink, red, or brown. Any of the following symptoms occur over the next 10 days: You have a temperature by mouth above 102 F (38.9 C), not controlled by medicine. Shortness of breath. Weakness after normal activity. The white part of the eye turns yellow (jaundice). You have a decrease in the amount of urine or are urinating less often. Your urine turns a dark color or changes to pink, red, or brown. Document Released: 04/26/2000 Document Revised: 07/22/2011 Document Reviewed: 12/14/2007 [facility] Patient Information 2014 [place], [place].  _______________________________________________________________________

## 2022-05-08 ENCOUNTER — Other Ambulatory Visit: Payer: Self-pay

## 2022-05-08 ENCOUNTER — Encounter (HOSPITAL_COMMUNITY): Payer: Self-pay

## 2022-05-08 ENCOUNTER — Encounter (HOSPITAL_COMMUNITY)
Admission: RE | Admit: 2022-05-08 | Discharge: 2022-05-08 | Disposition: A | Discharge status: Home / Self Care | Payer: Medicare Other | Source: Ambulatory Visit | Attending: Orthopedic Surgery | Admitting: Orthopedic Surgery

## 2022-05-08 VITALS — BP 126/59 | HR 68 | Temp 99.1°F | Resp 20 | Ht 65.0 in | Wt 196.0 lb

## 2022-05-08 DIAGNOSIS — Z7984 Long term (current) use of oral hypoglycemic drugs: Secondary | ICD-10-CM | POA: Diagnosis not present

## 2022-05-08 DIAGNOSIS — G4730 Sleep apnea, unspecified: Secondary | ICD-10-CM | POA: Insufficient documentation

## 2022-05-08 DIAGNOSIS — I10 Essential (primary) hypertension: Secondary | ICD-10-CM | POA: Diagnosis not present

## 2022-05-08 DIAGNOSIS — M1612 Unilateral primary osteoarthritis, left hip: Secondary | ICD-10-CM | POA: Insufficient documentation

## 2022-05-08 DIAGNOSIS — Z01812 Encounter for preprocedural laboratory examination: Secondary | ICD-10-CM | POA: Insufficient documentation

## 2022-05-08 DIAGNOSIS — R7303 Prediabetes: Secondary | ICD-10-CM

## 2022-05-08 DIAGNOSIS — Z87891 Personal history of nicotine dependence: Secondary | ICD-10-CM | POA: Insufficient documentation

## 2022-05-08 DIAGNOSIS — E119 Type 2 diabetes mellitus without complications: Secondary | ICD-10-CM | POA: Diagnosis not present

## 2022-05-08 DIAGNOSIS — Z01818 Encounter for other preprocedural examination: Secondary | ICD-10-CM

## 2022-05-08 HISTORY — DX: Personal history of urinary calculi: Z87.442

## 2022-05-08 HISTORY — DX: Fatty (change of) liver, not elsewhere classified: K76.0

## 2022-05-08 HISTORY — DX: Malignant (primary) neoplasm, unspecified: C80.1

## 2022-05-08 HISTORY — DX: Pneumonia, unspecified organism: J18.9

## 2022-05-08 LAB — HEMOGLOBIN A1C
Hgb A1c MFr Bld: 6.9 % — ABNORMAL HIGH (ref 4.8–5.6)
Mean Plasma Glucose: 151 mg/dL

## 2022-05-08 LAB — SURGICAL PCR SCREEN
MRSA, PCR: NEGATIVE
Staphylococcus aureus: NEGATIVE

## 2022-05-08 LAB — CBC
HCT: 42.2 % (ref 36.0–46.0)
Hemoglobin: 13.7 g/dL (ref 12.0–15.0)
MCH: 31 pg (ref 26.0–34.0)
MCHC: 32.5 g/dL (ref 30.0–36.0)
MCV: 95.5 fL (ref 80.0–100.0)
Platelets: 210 10*3/uL (ref 150–400)
RBC: 4.42 MIL/uL (ref 3.87–5.11)
RDW: 13.5 % (ref 11.5–15.5)
WBC: 8.2 10*3/uL (ref 4.0–10.5)
nRBC: 0 % (ref 0.0–0.2)

## 2022-05-08 LAB — BASIC METABOLIC PANEL
Anion gap: 6 (ref 5–15)
BUN: 15 mg/dL (ref 8–23)
CO2: 25 mmol/L (ref 22–32)
Calcium: 9 mg/dL (ref 8.9–10.3)
Chloride: 108 mmol/L (ref 98–111)
Creatinine, Ser: 0.76 mg/dL (ref 0.44–1.00)
GFR, Estimated: >60 mL/min (ref >60)
Glucose, Bld: 148 mg/dL — ABNORMAL HIGH (ref 70–99)
Potassium: 4.3 mmol/L (ref 3.5–5.1)
Sodium: 139 mmol/L (ref 135–145)

## 2022-05-08 LAB — GLUCOSE, CAPILLARY: Glucose-Capillary: 144 mg/dL — ABNORMAL HIGH (ref 70–99)

## 2022-05-09 ENCOUNTER — Other Ambulatory Visit: Payer: Self-pay

## 2022-05-09 ENCOUNTER — Telehealth: Payer: Self-pay

## 2022-05-09 MED ORDER — FOSFOMYCIN TROMETHAMINE 3 G PO PACK: 3.0000 g | PACK | Freq: Once | ORAL | 0 refills | Status: AC

## 2022-05-09 NOTE — Progress Notes (Signed)
Anesthesia Chart Review   Case: [id number] Date/Time: 05/15/22 0955   Procedure: TOTAL HIP ARTHROPLASTY ANTERIOR APPROACH (Left: Hip)   Anesthesia type: Choice   Pre-op diagnosis: left hip osteoarthritis   Location: [facility] ROOM 09 / [facility]   Surgeons: Gaynelle Arabian, MD       DISCUSSION:76 y.o. former smoker with h/o HTN, DM II, sleep apnea with CPAP, left hip OA scheduled for above procedure 05/15/2022 with Dr. Gaynelle Arabian.   Pt last seen by cardiology 05/02/2022. Per OV note, "Chart reviewed as part of pre-operative protocol coverage. Given past medical history and time since last visit, based on ACC/AHA guidelines, EVANEE LUBRANO would be at acceptable risk for the planned procedure without further cardiovascular testing.    Patient was advised that if she develops new symptoms prior to surgery to contact our office to arrange a follow-up appointment.  She verbalized understanding.   Her RCRI is a class I risk, 0.4% risk of major cardiac event.  She is able to complete greater than 4 METS of physical activity."  Anticipate pt can proceed with planned procedure barring acute status change.   VS: BP (!) 126/59 Comment: right arm sitting  Pulse 68   Temp 37.3 C (Oral)   Resp 20   Ht '5\' 5"'$  (1.651 m)   Wt 88.9 kg   SpO2 99%   BMI 32.62 kg/m   PROVIDERS: Donnajean Lopes, MD is PCP   Cardiologist - Minus Breeding, MD  LABS: Labs reviewed: Acceptable for surgery. (all labs ordered are listed, but only abnormal results are displayed)  Labs Reviewed  HEMOGLOBIN A1C - Abnormal; Notable for the following components:      Result Value   Hgb A1c MFr Bld 6.9 (*)    All other components within normal limits  BASIC METABOLIC PANEL - Abnormal; Notable for the following components:   Glucose, Bld 148 (*)    All other components within normal limits  GLUCOSE, CAPILLARY - Abnormal; Notable for the following components:   Glucose-Capillary 144 (*)    All other components within normal  limits  SURGICAL PCR SCREEN  CBC  TYPE AND SCREEN     IMAGES:   EKG:   CV:  Past Medical History:  Diagnosis Date   Allergy    Anxiety    Atrial septal defect    Cancer ([place])    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   Depression    Diabetes mellitus without complication ([place])    Fatty liver    History of kidney stones    Hyperlipidemia    Hypertension    Neuromuscular disorder (HCC)    neuropathy   Obesity    Osteoarthritis of lumbar spine    Osteoarthritis of thumb    bilateral   Pneumonia    Prediabetes    Renal disorder    kidney stones   Sleep apnea    uses CPAP    Past Surgical History:  Procedure Laterality Date   BREAST SURGERY  2008   Right breast   BUNIONECTOMY  1993   Right foot   CARPAL TUNNEL RELEASE Right    also had cyst removed   CATARACT EXTRACTION Bilateral    Lens implanted   CYSTOSCOPY W/ URETERAL STENT PLACEMENT Left 05/31/2015   Procedure: CYSTOSCOPY WITH STENT EXCHANGE;  Surgeon: Cleon Gustin, MD;  Location: [facility];  Service: Urology;  Laterality: Left;   CYSTOSCOPY WITH RETROGRADE PYELOGRAM, URETEROSCOPY AND STENT PLACEMENT Left 05/19/2015  Procedure: CYSTOSCOPY WITH RETROGRADE PYELOGRAM, URETEROSCOPY AND STENT PLACEMENT;  Surgeon: Cleon Gustin, MD;  Location: [facility];  Service: Urology;  Laterality: Left;   CYSTOSCOPY WITH RETROGRADE PYELOGRAM, URETEROSCOPY AND STENT PLACEMENT Left 02/14/2018   Procedure: CYSTOSCOPY WITH RETROGRADE PYELOGRAM, diagnostic URETEROSCOPY;  Surgeon: Alexis Frock, MD;  Location: [facility];  Service: Urology;  Laterality: Left;   CYSTOSCOPY/RETROGRADE/URETEROSCOPY/STONE EXTRACTION WITH BASKET Left 05/31/2015   Procedure: CYSTOSCOPY/RETROGRADE/STONE EXTRACTION WITH BASKET;  Surgeon: Cleon Gustin, MD;  Location: [facility];  Service: Urology;  Laterality: Left;   eyelid surgery Bilateral    Blephroplasty   REDUCTION MAMMAPLASTY Bilateral 2003   STONE EXTRACTION WITH BASKET Left  05/19/2015   Procedure: STONE EXTRACTION WITH BASKET;  Surgeon: Cleon Gustin, MD;  Location: [facility];  Service: Urology;  Laterality: Left;   TONSILLECTOMY     TOTAL HIP ARTHROPLASTY  08/2010   Right hip    MEDICATIONS:  Ascorbic Acid (VITAMIN C PO)   calcium-vitamin D (OSCAL WITH D) 500-200 MG-UNIT tablet   cycloSPORINE (RESTASIS) 0.05 % ophthalmic emulsion   ezetimibe (ZETIA) 10 MG tablet   fosfomycin (MONUROL) 3 g PACK   losartan (COZAAR) 50 MG tablet   metFORMIN (GLUCOPHAGE-XR) 500 MG 24 hr tablet   metroNIDAZOLE (METROGEL) 0.75 % gel   NON FORMULARY   Polyethyl Glycol-Propyl Glycol (SYSTANE OP)   rosuvastatin (CRESTOR) 10 MG tablet    0.9 %  sodium chloride infusion   Zowie Lundahl Ward, PA-C [facility] Pre-Surgical Testing [phone]

## 2022-05-09 NOTE — Telephone Encounter (Signed)
Patient states she had not taken the fosfomycin rx'd on 12/21 because her insurance would not cover it.  She is scheduled for surgery on 01/03.   Rx expired, verbal order from Dr. Alyson Ingles ok to reorder.  Rx reordered, PA completed and patient notified.  Patient will check on out of pocket cost if insurance denies rx.

## 2022-05-14 NOTE — Anesthesia Preprocedure Evaluation (Signed)
Anesthesia Evaluation  Patient identified by MRN, date of birth, ID band Patient awake    Reviewed: Allergy & Precautions, NPO status , Patient's Chart, lab work & pertinent test results  History of Anesthesia Complications Negative for: history of anesthetic complications  Airway Mallampati: II  TM Distance: >3 FB Neck ROM: Full    Dental  (+) Chipped, Dental Advisory Given   Pulmonary sleep apnea and Continuous Positive Airway Pressure Ventilation , former smoker   breath sounds clear to auscultation       Cardiovascular hypertension, Pt. on medications  Rhythm:Regular Rate:Normal  '18 ECHO: - Left ventricle: The cavity size was normal. Wall thickness was    normal. Systolic function was normal. The estimated ejection    fraction was in the range of 60% to 65%. Wall motion was normal;    there were no regional wall motion abnormalities. Doppler    parameters are consistent with abnormal left ventricular    relaxation (grade 1 diastolic dysfunction).  - Aortic valve: Mildly calcified annulus. Trileaflet; mildly    thickened leaflets. Valve area (VTI): 1.88 cm^2. Valve area    (Vmax): 1.76 cm^2. Valve area (Vmean): 1.64 cm^2.  - Mitral valve: Mildly calcified annulus. Mildly thickened leaflets  - Atrial septum: Limited visualization of the septum. Color Doppler    would suggest a very small left to right shunt though bubble    study appears negative. Color Doppler suggests probable PFO or    small ASD, the location is difficult to determine based on    visualized portions of the septum. Shunt appears to be very    small, normal right sided chamber sizes would suggest its    nonsignificant physiologically.      Neuro/Psych   Anxiety Depression    negative neurological ROS     GI/Hepatic negative GI ROS, Neg liver ROS,,,  Endo/Other  diabetes (glu 173), Oral Hypoglycemic Agents  BMI 32  Renal/GU H/o stones      Musculoskeletal   Abdominal  (+) + obese  Peds  Hematology negative hematology ROS (+)   Anesthesia Other Findings   Reproductive/Obstetrics                             Anesthesia Physical Anesthesia Plan  ASA: 3  Anesthesia Plan: Spinal   Post-op Pain Management: Tylenol PO (pre-op)*   Induction:   PONV Risk Score and Plan: 2 and Ondansetron and Dexamethasone  Airway Management Planned: Natural Airway and Simple Face Mask  Additional Equipment: None  Intra-op Plan:   Post-operative Plan:   Informed Consent: I have reviewed the patients History and Physical, chart, labs and discussed the procedure including the risks, benefits and alternatives for the proposed anesthesia with the patient or authorized representative who has indicated his/her understanding and acceptance.     Dental advisory given  Plan Discussed with: CRNA and Surgeon  Anesthesia Plan Comments:         Anesthesia Quick Evaluation

## 2022-05-15 ENCOUNTER — Encounter (HOSPITAL_COMMUNITY): Payer: Self-pay | Admitting: Orthopedic Surgery

## 2022-05-15 ENCOUNTER — Other Ambulatory Visit: Payer: Self-pay

## 2022-05-15 ENCOUNTER — Ambulatory Visit (HOSPITAL_BASED_OUTPATIENT_CLINIC_OR_DEPARTMENT_OTHER): Payer: Medicare Other | Admitting: Anesthesiology

## 2022-05-15 ENCOUNTER — Ambulatory Visit (HOSPITAL_COMMUNITY): Payer: Medicare Other | Admitting: Physician Assistant

## 2022-05-15 ENCOUNTER — Observation Stay (HOSPITAL_COMMUNITY)
Admission: RE | Admit: 2022-05-15 | Discharge: 2022-05-17 | Disposition: A | Discharge status: Home / Self Care | Payer: Medicare Other | Source: Ambulatory Visit | Attending: Orthopedic Surgery | Admitting: Orthopedic Surgery

## 2022-05-15 ENCOUNTER — Ambulatory Visit (HOSPITAL_COMMUNITY): Payer: Medicare Other

## 2022-05-15 ENCOUNTER — Observation Stay (HOSPITAL_COMMUNITY): Payer: Medicare Other

## 2022-05-15 ENCOUNTER — Encounter (HOSPITAL_COMMUNITY)
Admission: RE | Disposition: A | Discharge status: Home / Self Care | Payer: Self-pay | Source: Ambulatory Visit | Attending: Orthopedic Surgery

## 2022-05-15 DIAGNOSIS — F418 Other specified anxiety disorders: Secondary | ICD-10-CM | POA: Diagnosis not present

## 2022-05-15 DIAGNOSIS — I10 Essential (primary) hypertension: Secondary | ICD-10-CM | POA: Diagnosis not present

## 2022-05-15 DIAGNOSIS — Z85828 Personal history of other malignant neoplasm of skin: Secondary | ICD-10-CM | POA: Diagnosis not present

## 2022-05-15 DIAGNOSIS — E119 Type 2 diabetes mellitus without complications: Secondary | ICD-10-CM | POA: Diagnosis not present

## 2022-05-15 DIAGNOSIS — Z96641 Presence of right artificial hip joint: Secondary | ICD-10-CM | POA: Diagnosis not present

## 2022-05-15 DIAGNOSIS — R7303 Prediabetes: Secondary | ICD-10-CM

## 2022-05-15 DIAGNOSIS — M1612 Unilateral primary osteoarthritis, left hip: Principal | ICD-10-CM | POA: Diagnosis present

## 2022-05-15 DIAGNOSIS — Z79899 Other long term (current) drug therapy: Secondary | ICD-10-CM | POA: Diagnosis not present

## 2022-05-15 DIAGNOSIS — Z87891 Personal history of nicotine dependence: Secondary | ICD-10-CM

## 2022-05-15 DIAGNOSIS — Z7984 Long term (current) use of oral hypoglycemic drugs: Secondary | ICD-10-CM | POA: Insufficient documentation

## 2022-05-15 DIAGNOSIS — M169 Osteoarthritis of hip, unspecified: Secondary | ICD-10-CM | POA: Diagnosis present

## 2022-05-15 DIAGNOSIS — Z01818 Encounter for other preprocedural examination: Secondary | ICD-10-CM

## 2022-05-15 HISTORY — PX: TOTAL HIP ARTHROPLASTY: SHX124

## 2022-05-15 LAB — GLUCOSE, CAPILLARY
Glucose-Capillary: 173 mg/dL — ABNORMAL HIGH (ref 70–99)
Glucose-Capillary: 133 mg/dL — ABNORMAL HIGH (ref 70–99)
Glucose-Capillary: 215 mg/dL — ABNORMAL HIGH (ref 70–99)
Glucose-Capillary: 185 mg/dL — ABNORMAL HIGH (ref 70–99)

## 2022-05-15 LAB — TYPE AND SCREEN
ABO/RH(D): A POS
Antibody Screen: NEGATIVE

## 2022-05-15 SURGERY — ARTHROPLASTY, HIP, TOTAL, ANTERIOR APPROACH
Anesthesia: Spinal | Site: Hip | Laterality: Left

## 2022-05-15 MED ORDER — MIDAZOLAM HCL 2 MG/2ML IJ SOLN
INTRAMUSCULAR | Status: AC
  Filled 2022-05-15: qty 2

## 2022-05-15 MED ORDER — METHOCARBAMOL 500 MG IVPB - SIMPLE MED
500.0000 mg | Freq: Four times a day (QID) | INTRAVENOUS | Status: DC | PRN
  Administered 2022-05-15: 500 mg via INTRAVENOUS
  Filled 2022-05-15: qty 55

## 2022-05-15 MED ORDER — PROPOFOL 10 MG/ML IV BOLUS
INTRAVENOUS | Status: AC
  Filled 2022-05-15: qty 20

## 2022-05-15 MED ORDER — BUPIVACAINE IN DEXTROSE 0.75-8.25 % IT SOLN
INTRATHECAL | Status: DC | PRN
  Administered 2022-05-15: 1.6 mL via INTRATHECAL

## 2022-05-15 MED ORDER — PHENYLEPHRINE HCL-NACL 20-0.9 MG/250ML-% IV SOLN
INTRAVENOUS | Status: DC | PRN
  Administered 2022-05-15: 50 ug/min via INTRAVENOUS

## 2022-05-15 MED ORDER — ONDANSETRON HCL 4 MG/2ML IJ SOLN
INTRAMUSCULAR | Status: AC
  Filled 2022-05-15: qty 2

## 2022-05-15 MED ORDER — CHLORHEXIDINE GLUCONATE 0.12 % MT SOLN
15.0000 mL | Freq: Once | OROMUCOSAL | Status: AC
  Administered 2022-05-15: 15 mL via OROMUCOSAL

## 2022-05-15 MED ORDER — DEXAMETHASONE SODIUM PHOSPHATE 10 MG/ML IJ SOLN
INTRAMUSCULAR | Status: AC
  Filled 2022-05-15: qty 1

## 2022-05-15 MED ORDER — FLEET ENEMA 7-19 GM/118ML RE ENEM: 1.0000 | ENEMA | Freq: Once | RECTAL | Status: DC | PRN

## 2022-05-15 MED ORDER — MEPERIDINE HCL 50 MG/ML IJ SOLN: 6.2500 mg | INTRAMUSCULAR | Status: DC | PRN

## 2022-05-15 MED ORDER — PHENOL 1.4 % MT LIQD: 1.0000 | OROMUCOSAL | Status: DC | PRN

## 2022-05-15 MED ORDER — 0.9 % SODIUM CHLORIDE (POUR BTL) OPTIME
TOPICAL | Status: DC | PRN
  Administered 2022-05-15: 1000 mL

## 2022-05-15 MED ORDER — HYDROMORPHONE HCL 1 MG/ML IJ SOLN
0.5000 mg | INTRAMUSCULAR | Status: DC | PRN
  Administered 2022-05-15 – 2022-05-16 (×2): 1 mg via INTRAVENOUS
  Filled 2022-05-15 (×2): qty 1

## 2022-05-15 MED ORDER — ROSUVASTATIN CALCIUM 10 MG PO TABS: 10.0000 mg | ORAL_TABLET | ORAL | Status: DC

## 2022-05-15 MED ORDER — PHENYLEPHRINE HCL (PRESSORS) 10 MG/ML IV SOLN
INTRAVENOUS | Status: AC
  Filled 2022-05-15: qty 1

## 2022-05-15 MED ORDER — DEXAMETHASONE SODIUM PHOSPHATE 10 MG/ML IJ SOLN
INTRAMUSCULAR | Status: DC | PRN
  Administered 2022-05-15: 8 mg via INTRAVENOUS

## 2022-05-15 MED ORDER — METHOCARBAMOL 500 MG PO TABS
500.0000 mg | ORAL_TABLET | Freq: Four times a day (QID) | ORAL | Status: DC | PRN
  Administered 2022-05-15 – 2022-05-16 (×3): 500 mg via ORAL
  Filled 2022-05-15 (×3): qty 1

## 2022-05-15 MED ORDER — INSULIN ASPART 100 UNIT/ML IJ SOLN: 0.0000 [IU] | Freq: Every day | INTRAMUSCULAR | Status: DC

## 2022-05-15 MED ORDER — BISACODYL 10 MG RE SUPP: 10.0000 mg | Freq: Every day | RECTAL | Status: DC | PRN

## 2022-05-15 MED ORDER — EPHEDRINE SULFATE-NACL 50-0.9 MG/10ML-% IV SOSY
PREFILLED_SYRINGE | INTRAVENOUS | Status: DC | PRN
  Administered 2022-05-15: 5 mg via INTRAVENOUS

## 2022-05-15 MED ORDER — EPHEDRINE 5 MG/ML INJ
INTRAVENOUS | Status: AC
  Filled 2022-05-15: qty 5

## 2022-05-15 MED ORDER — WATER FOR IRRIGATION, STERILE IR SOLN
Status: DC | PRN
  Administered 2022-05-15: 2000 mL

## 2022-05-15 MED ORDER — PROPOFOL 10 MG/ML IV BOLUS
INTRAVENOUS | Status: DC | PRN
  Administered 2022-05-15: 10 mg via INTRAVENOUS

## 2022-05-15 MED ORDER — LACTATED RINGERS IV SOLN: INTRAVENOUS | Status: DC

## 2022-05-15 MED ORDER — ONDANSETRON HCL 4 MG/2ML IJ SOLN
INTRAMUSCULAR | Status: DC | PRN
  Administered 2022-05-15: 4 mg via INTRAVENOUS

## 2022-05-15 MED ORDER — BUPIVACAINE HCL (PF) 0.25 % IJ SOLN
INTRAMUSCULAR | Status: DC | PRN
  Administered 2022-05-15: 30 mL

## 2022-05-15 MED ORDER — METOCLOPRAMIDE HCL 5 MG/ML IJ SOLN: 5.0000 mg | Freq: Three times a day (TID) | INTRAMUSCULAR | Status: DC | PRN

## 2022-05-15 MED ORDER — METHOCARBAMOL 500 MG IVPB - SIMPLE MED
INTRAVENOUS | Status: AC
  Filled 2022-05-15: qty 55

## 2022-05-15 MED ORDER — PROMETHAZINE HCL 25 MG/ML IJ SOLN: 6.2500 mg | INTRAMUSCULAR | Status: DC | PRN

## 2022-05-15 MED ORDER — CEFAZOLIN SODIUM-DEXTROSE 2-4 GM/100ML-% IV SOLN
2.0000 g | Freq: Four times a day (QID) | INTRAVENOUS | Status: AC
  Administered 2022-05-15 (×2): 2 g via INTRAVENOUS
  Filled 2022-05-15 (×2): qty 100

## 2022-05-15 MED ORDER — DIPHENHYDRAMINE HCL 12.5 MG/5ML PO ELIX: 12.5000 mg | ORAL_SOLUTION | ORAL | Status: DC | PRN

## 2022-05-15 MED ORDER — PROPOFOL 1000 MG/100ML IV EMUL
INTRAVENOUS | Status: AC
  Filled 2022-05-15: qty 100

## 2022-05-15 MED ORDER — ONDANSETRON HCL 4 MG PO TABS: 4.0000 mg | ORAL_TABLET | Freq: Four times a day (QID) | ORAL | Status: DC | PRN

## 2022-05-15 MED ORDER — HYDROMORPHONE HCL 1 MG/ML IJ SOLN
0.2500 mg | INTRAMUSCULAR | Status: DC | PRN
  Administered 2022-05-15: 0.5 mg via INTRAVENOUS

## 2022-05-15 MED ORDER — INSULIN ASPART 100 UNIT/ML IJ SOLN
0.0000 [IU] | Freq: Three times a day (TID) | INTRAMUSCULAR | Status: DC
  Administered 2022-05-15: 5 [IU] via SUBCUTANEOUS
  Administered 2022-05-16: 3 [IU] via SUBCUTANEOUS
  Administered 2022-05-16: 2 [IU] via SUBCUTANEOUS
  Administered 2022-05-16: 3 [IU] via SUBCUTANEOUS
  Administered 2022-05-17: 2 [IU] via SUBCUTANEOUS

## 2022-05-15 MED ORDER — CEFAZOLIN SODIUM-DEXTROSE 2-4 GM/100ML-% IV SOLN
2.0000 g | INTRAVENOUS | Status: AC
  Administered 2022-05-15: 2 g via INTRAVENOUS
  Filled 2022-05-15: qty 100

## 2022-05-15 MED ORDER — ORAL CARE MOUTH RINSE: 15.0000 mL | OROMUCOSAL | Status: DC | PRN

## 2022-05-15 MED ORDER — LOSARTAN POTASSIUM 50 MG PO TABS
50.0000 mg | ORAL_TABLET | Freq: Two times a day (BID) | ORAL | Status: DC
  Administered 2022-05-16 – 2022-05-17 (×3): 50 mg via ORAL
  Filled 2022-05-15 (×3): qty 1

## 2022-05-15 MED ORDER — TRANEXAMIC ACID-NACL 1000-0.7 MG/100ML-% IV SOLN
1000.0000 mg | INTRAVENOUS | Status: AC
  Administered 2022-05-15: 1000 mg via INTRAVENOUS
  Filled 2022-05-15: qty 100

## 2022-05-15 MED ORDER — PROPOFOL 500 MG/50ML IV EMUL
INTRAVENOUS | Status: DC | PRN
  Administered 2022-05-15: 35 ug/kg/min via INTRAVENOUS

## 2022-05-15 MED ORDER — ACETAMINOPHEN 325 MG PO TABS
325.0000 mg | ORAL_TABLET | Freq: Four times a day (QID) | ORAL | Status: DC | PRN
  Administered 2022-05-16 – 2022-05-17 (×3): 650 mg via ORAL
  Filled 2022-05-15 (×3): qty 2

## 2022-05-15 MED ORDER — DEXAMETHASONE SODIUM PHOSPHATE 10 MG/ML IJ SOLN: 8.0000 mg | Freq: Once | INTRAMUSCULAR | Status: DC

## 2022-05-15 MED ORDER — HYDROMORPHONE HCL 2 MG PO TABS
2.0000 mg | ORAL_TABLET | ORAL | Status: DC | PRN
  Administered 2022-05-15: 4 mg via ORAL
  Administered 2022-05-15: 2 mg via ORAL
  Administered 2022-05-16 – 2022-05-17 (×6): 4 mg via ORAL
  Administered 2022-05-17: 2 mg via ORAL
  Filled 2022-05-15 (×4): qty 2
  Filled 2022-05-15: qty 1
  Filled 2022-05-15 (×2): qty 2
  Filled 2022-05-15: qty 1
  Filled 2022-05-15: qty 2

## 2022-05-15 MED ORDER — EZETIMIBE 10 MG PO TABS
10.0000 mg | ORAL_TABLET | Freq: Every day | ORAL | Status: DC
  Filled 2022-05-15: qty 1

## 2022-05-15 MED ORDER — BUPIVACAINE HCL 0.25 % IJ SOLN
INTRAMUSCULAR | Status: AC
  Filled 2022-05-15: qty 1

## 2022-05-15 MED ORDER — DOCUSATE SODIUM 100 MG PO CAPS
100.0000 mg | ORAL_CAPSULE | Freq: Two times a day (BID) | ORAL | Status: DC
  Administered 2022-05-15 – 2022-05-17 (×4): 100 mg via ORAL
  Filled 2022-05-15 (×4): qty 1

## 2022-05-15 MED ORDER — HYDROMORPHONE HCL 1 MG/ML IJ SOLN
INTRAMUSCULAR | Status: AC
  Administered 2022-05-15: 0.5 mg via INTRAVENOUS
  Filled 2022-05-15: qty 1

## 2022-05-15 MED ORDER — METOCLOPRAMIDE HCL 5 MG PO TABS: 5.0000 mg | ORAL_TABLET | Freq: Three times a day (TID) | ORAL | Status: DC | PRN

## 2022-05-15 MED ORDER — RIVAROXABAN 10 MG PO TABS
10.0000 mg | ORAL_TABLET | Freq: Every day | ORAL | Status: DC
  Administered 2022-05-16 – 2022-05-17 (×2): 10 mg via ORAL
  Filled 2022-05-15 (×2): qty 1

## 2022-05-15 MED ORDER — CYCLOSPORINE 0.05 % OP EMUL
1.0000 [drp] | Freq: Two times a day (BID) | OPHTHALMIC | Status: DC
  Administered 2022-05-15 – 2022-05-17 (×4): 1 [drp] via OPHTHALMIC
  Filled 2022-05-15 (×5): qty 30

## 2022-05-15 MED ORDER — ONDANSETRON HCL 4 MG/2ML IJ SOLN: 4.0000 mg | Freq: Four times a day (QID) | INTRAMUSCULAR | Status: DC | PRN

## 2022-05-15 MED ORDER — FENTANYL CITRATE (PF) 100 MCG/2ML IJ SOLN
INTRAMUSCULAR | Status: AC
  Filled 2022-05-15: qty 2

## 2022-05-15 MED ORDER — POVIDONE-IODINE 10 % EX SWAB
2.0000 | Freq: Once | CUTANEOUS | Status: AC
  Administered 2022-05-15: 2 via TOPICAL

## 2022-05-15 MED ORDER — FENTANYL CITRATE (PF) 100 MCG/2ML IJ SOLN
INTRAMUSCULAR | Status: DC | PRN
  Administered 2022-05-15 (×2): 50 ug via INTRAVENOUS

## 2022-05-15 MED ORDER — ORAL CARE MOUTH RINSE: 15.0000 mL | Freq: Once | OROMUCOSAL | Status: AC

## 2022-05-15 MED ORDER — MIDAZOLAM HCL 2 MG/2ML IJ SOLN: 0.5000 mg | Freq: Once | INTRAMUSCULAR | Status: DC | PRN

## 2022-05-15 MED ORDER — MENTHOL 3 MG MT LOZG: 1.0000 | LOZENGE | OROMUCOSAL | Status: DC | PRN

## 2022-05-15 MED ORDER — POLYETHYLENE GLYCOL 3350 17 G PO PACK: 17.0000 g | PACK | Freq: Every day | ORAL | Status: DC | PRN

## 2022-05-15 MED ORDER — MIDAZOLAM HCL 5 MG/5ML IJ SOLN
INTRAMUSCULAR | Status: DC | PRN
  Administered 2022-05-15 (×2): 1 mg via INTRAVENOUS

## 2022-05-15 MED ORDER — SODIUM CHLORIDE 0.9 % IV SOLN: INTRAVENOUS | Status: DC

## 2022-05-15 MED ORDER — LIDOCAINE HCL (PF) 2 % IJ SOLN
INTRAMUSCULAR | Status: AC
  Filled 2022-05-15: qty 5

## 2022-05-15 MED ORDER — ACETAMINOPHEN 10 MG/ML IV SOLN
1000.0000 mg | Freq: Four times a day (QID) | INTRAVENOUS | Status: DC
  Administered 2022-05-15: 1000 mg via INTRAVENOUS
  Filled 2022-05-15: qty 100

## 2022-05-15 MED ORDER — POLYVINYL ALCOHOL 1.4 % OP SOLN: 1.0000 [drp] | OPHTHALMIC | Status: DC | PRN

## 2022-05-15 SURGICAL SUPPLY — 41 items
BAG COUNTER SPONGE SURGICOUNT (BAG) IMPLANT
BAG DECANTER FOR FLEXI CONT (MISCELLANEOUS) IMPLANT
BAG ZIPLOCK 12X15 (MISCELLANEOUS) IMPLANT
BLADE SAG 18X100X1.27 (BLADE) ×1 IMPLANT
COVER PERINEAL POST (MISCELLANEOUS) ×1 IMPLANT
COVER SURGICAL LIGHT HANDLE (MISCELLANEOUS) ×1 IMPLANT
CUP ACETBLR 48 OD SECTOR II (Hips) IMPLANT
DRAPE FOOT SWITCH (DRAPES) ×1 IMPLANT
DRAPE STERI IOBAN 125X83 (DRAPES) ×1 IMPLANT
DRAPE U-SHAPE 47X51 STRL (DRAPES) ×2 IMPLANT
DRSG AQUACEL AG ADV 3.5X10 (GAUZE/BANDAGES/DRESSINGS) ×1 IMPLANT
DURAPREP 26ML APPLICATOR (WOUND CARE) ×1 IMPLANT
ELECT REM PT RETURN 15FT ADLT (MISCELLANEOUS) ×1 IMPLANT
GLOVE BIO SURGEON STRL SZ 6.5 (GLOVE) IMPLANT
GLOVE BIO SURGEON STRL SZ7.5 (GLOVE) IMPLANT
GLOVE BIO SURGEON STRL SZ8 (GLOVE) ×1 IMPLANT
GLOVE BIOGEL PI IND STRL 6.5 (GLOVE) IMPLANT
GLOVE BIOGEL PI IND STRL 7.0 (GLOVE) IMPLANT
GLOVE BIOGEL PI IND STRL 8 (GLOVE) ×1 IMPLANT
GOWN STRL REUS W/ TWL LRG LVL3 (GOWN DISPOSABLE) ×1 IMPLANT
GOWN STRL REUS W/ TWL XL LVL3 (GOWN DISPOSABLE) IMPLANT
GOWN STRL REUS W/TWL LRG LVL3 (GOWN DISPOSABLE) ×1
GOWN STRL REUS W/TWL XL LVL3 (GOWN DISPOSABLE)
HEAD FEM STD 28X+1.5 STRL (Hips) IMPLANT
HOLDER FOLEY CATH W/STRAP (MISCELLANEOUS) ×1 IMPLANT
KIT TURNOVER KIT A (KITS) IMPLANT
LINER MARATHON 28 48 (Hips) IMPLANT
MANIFOLD NEPTUNE II (INSTRUMENTS) ×1 IMPLANT
PACK ANTERIOR HIP CUSTOM (KITS) ×1 IMPLANT
PENCIL SMOKE EVACUATOR COATED (MISCELLANEOUS) ×1 IMPLANT
SPIKE FLUID TRANSFER (MISCELLANEOUS) ×1 IMPLANT
STEM FEM ACTIS STD SZ4 (Stem) IMPLANT
STRIP CLOSURE SKIN 1/2X4 (GAUZE/BANDAGES/DRESSINGS) ×1 IMPLANT
SUT ETHIBOND NAB CT1 #1 30IN (SUTURE) ×1 IMPLANT
SUT MNCRL AB 4-0 PS2 18 (SUTURE) ×1 IMPLANT
SUT STRATAFIX 0 PDS 27 VIOLET (SUTURE) ×1
SUT VIC AB 2-0 CT1 27 (SUTURE) ×2
SUT VIC AB 2-0 CT1 TAPERPNT 27 (SUTURE) ×2 IMPLANT
SUTURE STRATFX 0 PDS 27 VIOLET (SUTURE) ×1 IMPLANT
TRAY FOLEY MTR SLVR 16FR STAT (SET/KITS/TRAYS/PACK) ×1 IMPLANT
TUBE SUCTION HIGH CAP CLEAR NV (SUCTIONS) ×1 IMPLANT

## 2022-05-15 NOTE — Anesthesia Procedure Notes (Signed)
Date/Time: 05/15/2022 8:51 AM  Performed by: Sharlette Dense, CRNAOxygen Delivery Method: Simple face mask

## 2022-05-15 NOTE — Anesthesia Postprocedure Evaluation (Signed)
Anesthesia Post Note  Patient: Julie Owen  Procedure(s) Performed: TOTAL HIP ARTHROPLASTY ANTERIOR APPROACH (Left: Hip)     Patient location during evaluation: PACU Anesthesia Type: Spinal Level of consciousness: awake and alert, patient cooperative and oriented Pain management: pain level controlled Vital Signs Assessment: post-procedure vital signs reviewed and stable Respiratory status: nonlabored ventilation, spontaneous breathing and respiratory function stable Cardiovascular status: blood pressure returned to baseline and stable Postop Assessment: patient able to bend at knees, no apparent nausea or vomiting and spinal receding Anesthetic complications: no   No notable events documented.  Last Vitals:  Vitals:   05/15/22 1300 05/15/22 1315  BP: (!) 108/54 (!) 101/54  Pulse: 78 69  Resp: 10 11  Temp:    SpO2: 99% 99%    Last Pain:  Vitals:   05/15/22 1300  TempSrc:   PainSc: Asleep                 Julie Owen,E. Vasili Fok

## 2022-05-15 NOTE — Progress Notes (Signed)
Placed patient on CPAP of 8 at this time. Patient has her own tubing and mask and is tolerating well. RT will continue to monitor as needed

## 2022-05-15 NOTE — Evaluation (Signed)
Physical Therapy Evaluation Patient Details Name: Julie Owen MRN: [id number] DOB: 1945/06/07 Today's Date: 05/15/2022  History of Present Illness  Pt is a 77yo female presenting s/p L-THA, AA on 05/15/22. PMH: anxiety & depression, DM, HTN, HLD, neuropathy, OSA on CPAP, R-THA 2012, polymyalgia rheumatic,   Clinical Impression  Julie Owen is a 77 y.o. female POD 0 s/p L-THA, AA. Patient reports independence with mobility at baseline. Patient is now limited by functional impairments (see PT problem list below) and requires min assist for bed mobility and for transfers, pt with mild dizziness BP 140/79, dizziness abated and pt agreeable to proceed, RN aware of BP. Patient was able to ambulate 10 feet with RW and min guard level of assist. Patient instructed in exercise to facilitate ROM and circulation to manage edema. Provided incentive spirometer and with Vcs pt able to achieve 1224m. Patient will benefit from continued skilled PT interventions to address impairments and progress towards PLOF. Acute PT will follow to progress mobility and stair training in preparation for safe discharge home.       Recommendations for follow up therapy are one component of a multi-disciplinary discharge planning process, led by the attending physician.  Recommendations may be updated based on patient status, additional functional criteria and insurance authorization.  Follow Up Recommendations Follow physician's recommendations for discharge plan and follow up therapies      Assistance Recommended at Discharge Frequent or constant Supervision/Assistance  Patient can return home with the following  A little help with walking and/or transfers;A little help with bathing/dressing/bathroom;Assistance with cooking/housework;Assist for transportation;Help with stairs or ramp for entrance    Equipment Recommendations Rolling walker (2 wheels)  Recommendations for Other Services       Functional Status Assessment  Patient has had a recent decline in their functional status and demonstrates the ability to make significant improvements in function in a reasonable and predictable amount of time.     Precautions / Restrictions Precautions Precautions: Fall Restrictions Weight Bearing Restrictions: No Other Position/Activity Restrictions: wbat      Mobility  Bed Mobility Overal bed mobility: Needs Assistance Bed Mobility: Supine to Sit     Supine to sit: Min assist, HOB elevated     General bed mobility comments: Min assist bringing LLE off bed and for trunk elevation, heavy use of bed rails to elevate trunk despite assistance. Pt mildly dizzy upon sitting up, BP140/79 HR93, dizziness resolved with increased time in sitting.    Transfers Overall transfer level: Needs assistance Equipment used: Rolling walker (2 wheels) Transfers: Sit to/from Stand Sit to Stand: Min assist, From elevated surface           General transfer comment: Pt required min assist for bringing weight forward and light lift assistance, multimodal cues for hand placement, powering up through RLE and BUE. In standing, pt reporting increased pain but able to proceed.    Ambulation/Gait Ambulation/Gait assistance: Min guard, +2 safety/equipment Gait Distance (Feet): 10 Feet Assistive device: Rolling walker (2 wheels) Gait Pattern/deviations: Step-to pattern, Decreased step length - left, Decreased stance time - left Gait velocity: decreased     General Gait Details: Pt ambulated with RW and min guard, no physical assist or overt LOB noted, +2 for recliner follow for safety. Pt with decreased weight shift to L.  Stairs            Wheelchair Mobility    Modified Rankin (Stroke Patients Only)       Balance Overall balance assessment:  Needs assistance Sitting-balance support: Feet supported, No upper extremity supported Sitting balance-Leahy Scale: Good     Standing balance support: During functional  activity, Bilateral upper extremity supported, Reliant on assistive device for balance Standing balance-Leahy Scale: Poor                               Pertinent Vitals/Pain Pain Assessment Pain Assessment: 0-10 Pain Score: 5  Pain Location: left hip Pain Descriptors / Indicators: Operative site guarding Pain Intervention(s): Limited activity within patient's tolerance, Monitored during session, Repositioned, Ice applied    Home Living Family/patient expects to be discharged to:: Private residence Living Arrangements: Spouse/significant other Available Help at Discharge: Family;Available 24 hours/day Type of Home: House Home Access: Stairs to enter Entrance Stairs-Rails: Right [employer] door: 4 steps also with bilateral railings but cannot reach them at the same time.) Entrance Stairs-Number of Steps: 4   Home Layout: One level;Laundry or work area in [employer]: Civil engineer, contracting - built in;Toilet riser      Prior Function Prior Level of Function : Independent/Modified Independent             Mobility Comments: IND ADLs Comments: IND     Hand Dominance        Extremity/Trunk Assessment   Upper Extremity Assessment Upper Extremity Assessment: Overall WFL for tasks assessed    Lower Extremity Assessment Lower Extremity Assessment: RLE deficits/detail;LLE deficits/detail RLE Deficits / Details: MMT ank DF/PF 5/5 RLE Sensation: WNL LLE Deficits / Details: MMT ank DF/PF 5/5 LLE Sensation: WNL    Cervical / Trunk Assessment Cervical / Trunk Assessment: Kyphotic  Communication   Communication: No difficulties  Cognition Arousal/Alertness: Awake/alert Behavior During Therapy: WFL for tasks assessed/performed Overall Cognitive Status: Within Functional Limits for tasks assessed                                          General Comments General comments (skin integrity, edema, etc.): Husband Bill present    Exercises Total  Joint Exercises Ankle Circles/Pumps: AROM, Both, 20 reps   Assessment/Plan    PT Assessment Patient needs continued PT services  PT Problem List Decreased strength;Decreased range of motion;Decreased activity tolerance;Decreased balance;Decreased mobility;Decreased coordination;Pain       PT Treatment Interventions DME instruction;Gait training;Stair training;Functional mobility training;Therapeutic activities;Therapeutic exercise;Balance training;Neuromuscular re-education;Patient/family education    PT Goals (Current goals can be found in the Care Plan section)  Acute Rehab PT Goals Patient Stated Goal: Bend, clean the floor, PT Goal Formulation: With patient Time For Goal Achievement: 05/22/22 Potential to Achieve Goals: Good    Frequency 7X/week     Co-evaluation               AM-PAC PT "6 Clicks" Mobility  Outcome Measure Help needed turning from your back to your side while in a flat bed without using bedrails?: None Help needed moving from lying on your back to sitting on the side of a flat bed without using bedrails?: A Little Help needed moving to and from a bed to a chair (including a wheelchair)?: A Little Help needed standing up from a chair using your arms (e.g., wheelchair or bedside chair)?: A Little Help needed to walk in hospital room?: A Little Help needed climbing 3-5 steps with a railing? : A Little 6 Click Score: 19  End of Session Equipment Utilized During Treatment: Gait belt Activity Tolerance: Patient tolerated treatment well;No increased pain Patient left: in chair;with call bell/phone within reach;with chair alarm set;with family/visitor present;with SCD's reapplied Nurse Communication: Mobility status PT Visit Diagnosis: Pain;Difficulty in walking, not elsewhere classified (R26.2) Pain - Right/Left: Left Pain - part of body: Hip    Time: 0086-7619 PT Time Calculation (min) (ACUTE ONLY): 19 min   Charges:   PT Evaluation $PT Eval  Low Complexity: Wharton, PT, DPT [facility] Office: [phone] Weekend pager: [phone]  Coolidge Breeze 05/15/2022, 4:54 PM

## 2022-05-15 NOTE — Transfer of Care (Signed)
Immediate Anesthesia Transfer of Care Note  Patient: Julie Owen  Procedure(s) Performed: TOTAL HIP ARTHROPLASTY ANTERIOR APPROACH (Left: Hip)  Patient Location: PACU  Anesthesia Type:Spinal  Level of Consciousness: awake, alert , and oriented  Airway & Oxygen Therapy: Patient Spontanous Breathing and Patient connected to face mask oxygen  Post-op Assessment: Report given to RN and Post -op Vital signs reviewed and stable  Post vital signs: Reviewed and stable  Last Vitals:  Vitals Value Taken Time  BP    Temp    Pulse 67 05/15/22 1127  Resp 16 05/15/22 1127  SpO2 100 % 05/15/22 1127  Vitals shown include unvalidated device data.  Last Pain:  Vitals:   05/15/22 0814  TempSrc:   PainSc: 5       Patients Stated Pain Goal: 4 (50/38/88 2800)  Complications: No notable events documented.

## 2022-05-15 NOTE — Interval H&P Note (Signed)
History and Physical Interval Note:  05/15/2022 8:14 AM  Julie Owen  has presented today for surgery, with the diagnosis of left hip osteoarthritis.  The various methods of treatment have been discussed with the patient and family. After consideration of risks, benefits and other options for treatment, the patient has consented to  Procedure(s): TOTAL HIP ARTHROPLASTY ANTERIOR APPROACH (Left) as a surgical intervention.  The patient's history has been reviewed, patient examined, no change in status, stable for surgery.  I have reviewed the patient's chart and labs.  Questions were answered to the patient's satisfaction.     Pilar Plate Jamicheal Heard

## 2022-05-15 NOTE — Op Note (Signed)
OPERATIVE REPORT- TOTAL HIP ARTHROPLASTY   PREOPERATIVE DIAGNOSIS: Osteoarthritis of the Left hip.   POSTOPERATIVE DIAGNOSIS: Osteoarthritis of the Left  hip.   PROCEDURE: Left total hip arthroplasty, anterior approach.   SURGEON: Gaynelle Arabian, MD   ASSISTANT: Jaynie Bream, PA-C  ANESTHESIA:  Spinal  ESTIMATED BLOOD LOSS:-300 mL    DRAINS: None  COMPLICATIONS: None   CONDITION: PACU - hemodynamically stable.   BRIEF CLINICAL NOTE: Julie Owen is a 77 y.o. female who has advanced end-  stage arthritis of their Left  hip with progressively worsening pain and  dysfunction.The patient has failed nonoperative management and presents for  total hip arthroplasty.   PROCEDURE IN DETAIL: After successful administration of spinal  anesthetic, the traction boots for the [facility] bed were placed on both  feet and the patient was placed onto the [facility] bed, boots placed into the leg  holders. The Left hip was then isolated from the perineum with plastic  drapes and prepped and draped in the usual sterile fashion. ASIS and  greater trochanter were marked and a oblique incision was made, starting  at about 1 cm lateral and 2 cm distal to the ASIS and coursing towards  the anterior cortex of the femur. The skin was cut with a 10 blade  through subcutaneous tissue to the level of the fascia overlying the  tensor fascia lata muscle. The fascia was then incised in line with the  incision at the junction of the anterior third and posterior 2/3rd. The  muscle was teased off the fascia and then the interval between the TFL  and the rectus was developed. The Hohmann retractor was then placed at  the top of the femoral neck over the capsule. The vessels overlying the  capsule were cauterized and the fat on top of the capsule was removed.  A Hohmann retractor was then placed anterior underneath the rectus  femoris to give exposure to the entire anterior capsule. A T-shaped  capsulotomy was  performed. The edges were tagged and the femoral head  was identified.       Osteophytes are removed off the superior acetabulum.  The femoral neck was then cut in situ with an oscillating saw. Traction  was then applied to the left lower extremity utilizing the [facility]  traction. The femoral head was then removed. Retractors were placed  around the acetabulum and then circumferential removal of the labrum was  performed. Osteophytes were also removed. Reaming starts at 45 mm to  medialize and  Increased in 2 mm increments to 47 mm. We reamed in  approximately 40 degrees of abduction, 20 degrees anteversion. A 48 mm  pinnacle acetabular shell was then impacted in anatomic position under  fluoroscopic guidance with excellent purchase. We did not need to place  any additional dome screws. A 28 mm neutral + 4 marathon liner was then  placed into the acetabular shell.       The femoral lift was then placed along the lateral aspect of the femur  just distal to the vastus ridge. The leg was  externally rotated and capsule  was stripped off the inferior aspect of the femoral neck down to the  level of the lesser trochanter, this was done with electrocautery. The femur was lifted after this was performed. The  leg was then placed in an extended and adducted position essentially delivering the femur. We also removed the capsule superiorly and the piriformis from the piriformis fossa to  gain excellent exposure of the  proximal femur. Rongeur was used to remove some cancellous bone to get  into the lateral portion of the proximal femur for placement of the  initial starter reamer. The starter broaches was placed  the starter broach  and was shown to go down the center of the canal. Broaching  with the Actis system was then performed starting at size 0  coursing  Up to size 4. A size 4 had excellent torsional and rotational  and axial stability. The trial standard offset neck was then placed  with a 28 +  1.5 trial head. The hip was then reduced. We confirmed that  the stem was in the canal both on AP and lateral x-rays. It also has excellent sizing. The hip was reduced with outstanding stability through full extension and full external rotation.. AP pelvis was taken and the leg lengths were measured and found to be equal. Hip was then dislocated again and the femoral head and neck removed. The  femoral broach was removed. Size 4 Actis stem with a standard offset  neck was then impacted into the femur following native anteversion. Has  excellent purchase in the canal. Excellent torsional and rotational and  axial stability. It is confirmed to be in the canal on AP and lateral  fluoroscopic views. The 28 + 1.5 metal head was placed and the hip  reduced with outstanding stability. Again AP pelvis was taken and it  confirmed that the leg lengths were equal. The wound was then copiously  irrigated with saline solution and the capsule reattached and repaired  with Ethibond suture. 30 ml of .25% Bupivicaine was  injected into the capsule and into the edge of the tensor fascia lata as well as subcutaneous tissue. The fascia overlying the tensor fascia lata was then closed with a running #1 V-Loc. Subcu was closed with interrupted 2-0 Vicryl and subcuticular running 4-0 Monocryl. Incision was cleaned  and dried. Steri-Strips and a bulky sterile dressing applied. The patient was awakened and transported to  recovery in stable condition.        Please note that a surgical assistant was a medical necessity for this procedure to perform it in a safe and expeditious manner. Assistant was necessary to provide appropriate retraction of vital neurovascular structures and to prevent femoral fracture and allow for anatomic placement of the prosthesis.  Gaynelle Arabian, M.D.

## 2022-05-15 NOTE — Discharge Instructions (Addendum)
Gaynelle Arabian, MD Total Joint Specialist [facility] [place] [address], [address] [place], [place] [address] [phone]  TOTAL KNEE REPLACEMENT POSTOPERATIVE DIRECTIONS    Knee Rehabilitation, Guidelines Following Surgery  Results after knee surgery are often greatly improved when you follow the exercise, range of motion and muscle strengthening exercises prescribed by your doctor. Safety measures are also important to protect the knee from further injury. If any of these exercises cause you to have increased pain or swelling in your knee joint, decrease the amount until you are comfortable again and slowly increase them. If you have problems or questions, call your caregiver or physical therapist for advice.   BLOOD CLOT PREVENTION Take a 10 mg Xarelto once a day for three weeks following surgery. Then discontinue. You may resume your vitamins/supplements once you have discontinued the Xarelto. Do not take any NSAIDs (Advil, Aleve, Ibuprofen, Meloxicam, etc.) until you have discontinued the Xarelto.   HOME CARE INSTRUCTIONS  Remove items at home which could result in a fall. This includes throw rugs or furniture in walking pathways.  ICE to the affected knee as much as tolerated. Icing helps control swelling. If the swelling is well controlled you will be more comfortable and rehab easier. Continue to use ice on the knee for pain and swelling from surgery. You may notice swelling that will progress down to the foot and ankle. This is normal after surgery. Elevate the leg when you are not up walking on it.    Continue to use the breathing machine which will help keep your temperature down. It is common for your temperature to cycle up and down following surgery, especially at night when you are not up moving around and exerting yourself. The breathing machine keeps your lungs expanded and your temperature down. Do not place pillow under the operative knee, focus on keeping  the knee straight while resting  DIET You may resume your previous home diet once you are discharged from the hospital.  DRESSING / WOUND CARE / SHOWERING Keep your bulky bandage on for 2 days. On the third post-operative day you may remove the Ace bandage and gauze. There is a waterproof adhesive bandage on your skin which will stay in place until your first follow-up appointment. Once you remove this you will not need to place another bandage You may begin showering 3 days following surgery, but do not submerge the incision under water.  ACTIVITY For the first 5 days, the key is rest and control of pain and swelling Do your home exercises twice a day starting on post-operative day 3. On the days you go to physical therapy, just do the home exercises once that day. You should rest, ice and elevate the leg for 50 minutes out of every hour. Get up and walk/stretch for 10 minutes per hour. After 5 days you can increase your activity slowly as tolerated. Walk with your walker as instructed. Use the walker until you are comfortable transitioning to a cane. Walk with the cane in the opposite hand of the operative leg. You may discontinue the cane once you are comfortable and walking steadily. Avoid periods of inactivity such as sitting longer than an hour when not asleep. This helps prevent blood clots.  You may discontinue the knee immobilizer once you are able to perform a straight leg raise while lying down. You may resume a sexual relationship in one month or when given the OK by your doctor.  You may return to work  once you are cleared by your doctor.  Do not drive a car for 6 weeks or until released by your surgeon.  Do not drive while taking narcotics.  TED HOSE STOCKINGS Wear the elastic stockings on both legs for three weeks following surgery during the day. You may remove them at night for sleeping.  WEIGHT BEARING Weight bearing as tolerated with assist device (walker, cane, etc) as  directed, use it as long as suggested by your surgeon or therapist, typically at least 4-6 weeks.  POSTOPERATIVE CONSTIPATION PROTOCOL Constipation - defined medically as fewer than three stools per week and severe constipation as less than one stool per week.  One of the most common issues patients have following surgery is constipation.  Even if you have a regular bowel pattern at home, your normal regimen is likely to be disrupted due to multiple reasons following surgery.  Combination of anesthesia, postoperative narcotics, change in appetite and fluid intake all can affect your bowels.  In order to avoid complications following surgery, here are some recommendations in order to help you during your recovery period.  Colace (docusate) - Pick up an over-the-counter form of Colace or another stool softener and take twice a day as long as you are requiring postoperative pain medications.  Take with a full glass of water daily.  If you experience loose stools or diarrhea, hold the colace until you stool forms back up. If your symptoms do not get better within 1 week or if they get worse, check with your doctor. Dulcolax (bisacodyl) - Pick up over-the-counter and take as directed by the product packaging as needed to assist with the movement of your bowels.  Take with a full glass of water.  Use this product as needed if not relieved by Colace only.  MiraLax (polyethylene glycol) - Pick up over-the-counter to have on hand. MiraLax is a solution that will increase the amount of water in your bowels to assist with bowel movements.  Take as directed and can mix with a glass of water, juice, soda, coffee, or tea. Take if you go more than two days without a movement. Do not use MiraLax more than once per day. Call your doctor if you are still constipated or irregular after using this medication for 7 days in a row.  If you continue to have problems with postoperative constipation, please contact the office for  further assistance and recommendations.  If you experience "the worst abdominal pain ever" or develop nausea or vomiting, please contact the office immediatly for further recommendations for treatment.  ITCHING If you experience itching with your medications, try taking only a single pain pill, or even half a pain pill at a time.  You can also use Benadryl over the counter for itching or also to help with sleep.   MEDICATIONS See your medication summary on the "After Visit Summary" that the nursing staff will review with you prior to discharge.  You may have some home medications which will be placed on hold until you complete the course of blood thinner medication.  It is important for you to complete the blood thinner medication as prescribed by your surgeon.  Continue your approved medications as instructed at time of discharge.  PRECAUTIONS If you experience chest pain or shortness of breath - call 911 immediately for transfer to the hospital emergency department.  If you develop a fever greater that 101 F, purulent drainage from wound, increased redness or drainage from wound, foul odor from the  wound/dressing, or calf pain - CONTACT YOUR SURGEON.                                                   FOLLOW-UP APPOINTMENTS Make sure you keep all of your appointments after your operation with your surgeon and caregivers. You should call the office at the above phone number and make an appointment for approximately two weeks after the date of your surgery or on the date instructed by your surgeon outlined in the "After Visit Summary".  RANGE OF MOTION AND STRENGTHENING EXERCISES  Rehabilitation of the knee is important following a knee injury or an operation. After just a few days of immobilization, the muscles of the thigh which control the knee become weakened and shrink (atrophy). Knee exercises are designed to build up the tone and strength of the thigh muscles and to improve knee motion. Often  times heat used for twenty to thirty minutes before working out will loosen up your tissues and help with improving the range of motion but do not use heat for the first two weeks following surgery. These exercises can be done on a training (exercise) mat, on the floor, on a table or on a bed. Use what ever works the best and is most comfortable for you Knee exercises include:  Leg Lifts - While your knee is still immobilized in a splint or cast, you can do straight leg raises. Lift the leg to 60 degrees, hold for 3 sec, and slowly lower the leg. Repeat 10-20 times 2-3 times daily. Perform this exercise against resistance later as your knee gets better.  Quad and Hamstring Sets - Tighten up the muscle on the front of the thigh (Quad) and hold for 5-10 sec. Repeat this 10-20 times hourly. Hamstring sets are done by pushing the foot backward against an object and holding for 5-10 sec. Repeat as with quad sets.  Leg Slides: Lying on your back, slowly slide your foot toward your buttocks, bending your knee up off the floor (only go as far as is comfortable). Then slowly slide your foot back down until your leg is flat on the floor again. Angel Wings: Lying on your back spread your legs to the side as far apart as you can without causing discomfort.  A rehabilitation program following serious knee injuries can speed recovery and prevent re-injury in the future due to weakened muscles. Contact your doctor or a physical therapist for more information on knee rehabilitation.   POST-OPERATIVE OPIOID TAPER INSTRUCTIONS: It is important to wean off of your opioid medication as soon as possible. If you do not need pain medication after your surgery it is ok to stop day one. Opioids include: Codeine, Hydrocodone(Norco, Vicodin), Oxycodone(Percocet, oxycontin) and hydromorphone amongst others.  Long term and even short term use of opiods can cause: Increased pain  response Dependence Constipation Depression Respiratory depression And more.  Withdrawal symptoms can include Flu like symptoms Nausea, vomiting And more Techniques to manage these symptoms Hydrate well Eat regular healthy meals Stay active Use relaxation techniques(deep breathing, meditating, yoga) Do Not substitute Alcohol to help with tapering If you have been on opioids for less than two weeks and do not have pain than it is ok to stop all together.  Plan to wean off of opioids This plan should start within one week post op  of your joint replacement. Maintain the same interval or time between taking each dose and first decrease the dose.  Cut the total daily intake of opioids by one tablet each day Next start to increase the time between doses. The last dose that should be eliminated is the evening dose.   IF YOU ARE TRANSFERRED TO A SKILLED REHAB FACILITY If the patient is transferred to a skilled rehab facility following release from the hospital, a list of the current medications will be sent to the facility for the patient to continue.  When discharged from the skilled rehab facility, please have the facility set up the patient's [place] prior to being released. Also, the skilled facility will be responsible for providing the patient with their medications at time of release from the facility to include their pain medication, the muscle relaxants, and their blood thinner medication. If the patient is still at the rehab facility at time of the two week follow up appointment, the skilled rehab facility will also need to assist the patient in arranging follow up appointment in our office and any transportation needs.  MAKE SURE YOU:  Understand these instructions.  Get help right away if you are not doing well or get worse.   DENTAL ANTIBIOTICS:  In most cases prophylactic antibiotics for Dental procdeures after total joint surgery are not  necessary.  Exceptions are as follows:  1. History of prior total joint infection  2. Severely immunocompromised (Organ Transplant, cancer chemotherapy, Rheumatoid biologic medications such as [place])  3. Poorly controlled diabetes (A1C &gt; 8.0, blood glucose over 200)  If you have one of these conditions, contact your surgeon for an antibiotic prescription, prior to your dental procedure.    Pick up stool softner and laxative for home use following surgery while on pain medications. Do not submerge incision under water. Please use good hand washing techniques while changing dressing each day. May shower starting three days after surgery. Please use a clean towel to pat the incision dry following showers. Continue to use ice for pain and swelling after surgery. Do not use any lotions or creams on the incision until instructed by your surgeon.  Information on my medicine - XARELTO (Rivaroxaban)  This medication education was reviewed with me or my healthcare representative as part of my discharge preparation.  The pharmacist that spoke with me during my hospital stay was:    Why was Xarelto prescribed for you? Xarelto was prescribed for you to reduce the risk of blood clots forming after orthopedic surgery. The medical term for these abnormal blood clots is venous thromboembolism (VTE).  What do you need to know about xarelto ? Take your Xarelto ONCE DAILY at the same time every day. You may take it either with or without food.  If you have difficulty swallowing the tablet whole, you may crush it and mix in applesauce just prior to taking your dose.  Take Xarelto exactly as prescribed by your doctor and DO NOT stop taking Xarelto without talking to the doctor who prescribed the medication.  Stopping without other VTE prevention medication to take the place of Xarelto may increase your risk of developing a clot.  After discharge, you should have regular check-up  appointments with your healthcare provider that is prescribing your Xarelto.    What do you do if you miss a dose? If you miss a dose, take it as soon as you remember on the same day then continue your regularly scheduled once  daily regimen the next day. Do not take two doses of Xarelto on the same day.   Important Safety Information A possible side effect of Xarelto is bleeding. You should call your healthcare provider right away if you experience any of the following: Bleeding from an injury or your nose that does not stop. Unusual colored urine (red or dark brown) or unusual colored stools (red or black). Unusual bruising for unknown reasons. A serious fall or if you hit your head (even if there is no bleeding).  Some medicines may interact with Xarelto and might increase your risk of bleeding while on Xarelto. To help avoid this, consult your healthcare provider or pharmacist prior to using any new prescription or non-prescription medications, including herbals, vitamins, non-steroidal anti-inflammatory drugs (NSAIDs) and supplements.  This website has more information on Xarelto: [email or web address]

## 2022-05-15 NOTE — Anesthesia Procedure Notes (Addendum)
Spinal  Patient location during procedure: OR Start time: 05/15/2022 9:53 AM End time: 05/15/2022 9:57 AM Staffing Performed: resident/CRNA  Resident/CRNA: Sharlette Dense, CRNA Performed by: Sharlette Dense, CRNA Authorized by: Annye Asa, MD   Preanesthetic Checklist Completed: patient identified, IV checked, site marked, risks and benefits discussed, surgical consent, monitors and equipment checked, pre-op evaluation and timeout performed Spinal Block Patient position: sitting Prep: DuraPrep and site prepped and draped Patient monitoring: heart rate, continuous pulse ox and blood pressure Approach: midline Location: L3-4 Injection technique: single-shot Needle Needle type: Pencan  Needle gauge: 24 G Needle length: 9 cm Assessment Sensory level: T6 Additional Notes Kit expiration date 04/11/2024 and lot# [id number] Clear free flow CSF, negative paresthesia, negative heme Tolerated well and returned to supine position

## 2022-05-16 ENCOUNTER — Other Ambulatory Visit (HOSPITAL_COMMUNITY): Payer: Self-pay

## 2022-05-16 DIAGNOSIS — M1612 Unilateral primary osteoarthritis, left hip: Secondary | ICD-10-CM | POA: Diagnosis not present

## 2022-05-16 LAB — BASIC METABOLIC PANEL
Anion gap: 8 (ref 5–15)
BUN: 14 mg/dL (ref 8–23)
CO2: 24 mmol/L (ref 22–32)
Calcium: 8.6 mg/dL — ABNORMAL LOW (ref 8.9–10.3)
Chloride: 105 mmol/L (ref 98–111)
Creatinine, Ser: 0.7 mg/dL (ref 0.44–1.00)
GFR, Estimated: >60 mL/min (ref >60)
Glucose, Bld: 180 mg/dL — ABNORMAL HIGH (ref 70–99)
Potassium: 4.9 mmol/L (ref 3.5–5.1)
Sodium: 137 mmol/L (ref 135–145)

## 2022-05-16 LAB — CBC
HCT: 39.1 % (ref 36.0–46.0)
Hemoglobin: 12.7 g/dL (ref 12.0–15.0)
MCH: 31 pg (ref 26.0–34.0)
MCHC: 32.5 g/dL (ref 30.0–36.0)
MCV: 95.4 fL (ref 80.0–100.0)
Platelets: 190 10*3/uL (ref 150–400)
RBC: 4.1 MIL/uL (ref 3.87–5.11)
RDW: 13 % (ref 11.5–15.5)
WBC: 14.1 10*3/uL — ABNORMAL HIGH (ref 4.0–10.5)
nRBC: 0 % (ref 0.0–0.2)

## 2022-05-16 LAB — GLUCOSE, CAPILLARY
Glucose-Capillary: 158 mg/dL — ABNORMAL HIGH (ref 70–99)
Glucose-Capillary: 167 mg/dL — ABNORMAL HIGH (ref 70–99)
Glucose-Capillary: 122 mg/dL — ABNORMAL HIGH (ref 70–99)
Glucose-Capillary: 171 mg/dL — ABNORMAL HIGH (ref 70–99)

## 2022-05-16 MED ORDER — EZETIMIBE 10 MG PO TABS
10.0000 mg | ORAL_TABLET | Freq: Every day | ORAL | Status: DC
  Administered 2022-05-16: 10 mg via ORAL
  Filled 2022-05-16: qty 1

## 2022-05-16 NOTE — TOC Transition Note (Signed)
Transition of Care [facility]) - CM/SW Discharge Note   Patient Details  Name: Julie Owen MRN: [id number] Date of Birth: 1945/12/29  [facility] CM/SW Contact:  Lennart Pall, LCSW Phone Number: 05/16/2022, 10:16 AM   Clinical Narrative:    Met with pt while working with PT and confirming she is to receive a RW - order placed with [employer] who will also discuss with pt possible 3n1 and review cost.  Plan for HEP.  No further TOC needs.   Final next level of care: Home/Self Care Barriers to Discharge: No Barriers Identified   Patient Goals and CMS Choice      Discharge Placement                         Discharge Plan and Services Additional resources added to the After Visit Summary for                  DME Arranged: Walker rolling DME Agency: [employer] Date DME Agency Contacted: 05/16/22 Time DME Agency Contacted: [id number] Representative spoke with at DME Agency: Elta Gu[place]            Social Determinants of Health ([place]) Interventions SDOH Screenings   Food Insecurity: No Food Insecurity (05/15/2022)  Housing: Low Risk  (05/15/2022)  Transportation Needs: No Transportation Needs (05/15/2022)  Utilities: Not At Risk (05/15/2022)  Tobacco Use: Medium Risk (05/15/2022)     Readmission Risk Interventions     No data to display

## 2022-05-16 NOTE — Plan of Care (Signed)
  Problem: Pain Management: Goal: Pain level will decrease with appropriate interventions Outcome: Progressing   Problem: Coping: Goal: Level of anxiety will decrease Outcome: Progressing

## 2022-05-16 NOTE — Progress Notes (Signed)
Physical Therapy Treatment Patient Details Name: Julie Owen MRN: [id number] DOB: 1946/04/24 Today's Date: 05/16/2022   History of Present Illness Pt is a 77yo female presenting s/p L-THA, AA on 05/15/22. PMH: anxiety & depression, DM, HTN, HLD, neuropathy, OSA on CPAP, R-THA 2012, polymyalgia rheumatic,    PT Comments    Patient making good progress with mobility and ambulated ~160' this session with RW and min guard for safety. Pt was able to negotiate tight bathroom space without assist and min guard only for power up form EOB and toilet. EOS pt resting in recliner and encouraged to complete ankle pumps for circulation and spend meals OOB. Will continue to progress pt as able in acute setting with plan for stair training tomorrow.   Recommendations for follow up therapy are one component of a multi-disciplinary discharge planning process, led by the attending physician.  Recommendations may be updated based on patient status, additional functional criteria and insurance authorization.  Follow Up Recommendations  Follow physician's recommendations for discharge plan and follow up therapies     Assistance Recommended at Discharge Frequent or constant Supervision/Assistance  Patient can return home with the following A little help with walking and/or transfers;A little help with bathing/dressing/bathroom;Assistance with cooking/housework;Assist for transportation;Help with stairs or ramp for entrance   Equipment Recommendations  Rolling walker (2 wheels)    Recommendations for Other Services       Precautions / Restrictions Precautions Precautions: Fall Restrictions Weight Bearing Restrictions: No LLE Weight Bearing: Weight bearing as tolerated     Mobility  Bed Mobility Overal bed mobility: Needs Assistance Bed Mobility: Supine to Sit     Supine to sit: Min assist, HOB elevated     General bed mobility comments: cues to use belt for Lt LE, pt required min assist to pivot  with bed pad, bring LE's off EOB and raise trunk. cues needed to use bed rail with bil UE to lift up trunk.    Transfers Overall transfer level: Needs assistance Equipment used: Rolling walker (2 wheels) Transfers: Sit to/from Stand Sit to Stand: Min guard           General transfer comment: cues to use bil UE for power up to stand at RW. no LOB noted with rise. pt slow to lower due to pain but controlled with bil UE's.    Ambulation/Gait Ambulation/Gait assistance: Min guard Gait Distance (Feet): 160 Feet Assistive device: Rolling walker (2 wheels) Gait Pattern/deviations: Step-to pattern, Decreased step length - left, Decreased stance time - left Gait velocity: decreased     General Gait Details: min guard with cues to maintain safe/close position to RW. no LOB noted.   Stairs             Wheelchair Mobility    Modified Rankin (Stroke Patients Only)       Balance Overall balance assessment: Needs assistance Sitting-balance support: Feet supported, No upper extremity supported Sitting balance-Leahy Scale: Good     Standing balance support: During functional activity, Bilateral upper extremity supported, Reliant on assistive device for balance Standing balance-Leahy Scale: Poor                              Cognition Arousal/Alertness: Awake/alert Behavior During Therapy: WFL for tasks assessed/performed Overall Cognitive Status: Within Functional Limits for tasks assessed  Exercises Total Joint Exercises Ankle Circles/Pumps: AROM, Both, 10 reps Quad Sets: AROM, Both, 10 reps Heel Slides: AAROM, Left, 5 reps Long Arc Quad: AROM, Left, 5 reps Knee Flexion: AROM, Left, 5 reps, Standing    General Comments        Pertinent Vitals/Pain Pain Assessment Pain Assessment: 0-10 Pain Score: 5  Pain Location: left hip Pain Descriptors / Indicators: Operative site guarding Pain  Intervention(s): Limited activity within patient's tolerance, Monitored during session, Repositioned, Premedicated before session    Home Living                          Prior Function            PT Goals (current goals can now be found in the care plan section) Acute Rehab PT Goals Patient Stated Goal: Bend, clean the floor, PT Goal Formulation: With patient Time For Goal Achievement: 05/22/22 Potential to Achieve Goals: Good Progress towards PT goals: Progressing toward goals    Frequency    7X/week      PT Plan      Co-evaluation              AM-PAC PT "6 Clicks" Mobility   Outcome Measure  Help needed turning from your back to your side while in a flat bed without using bedrails?: None Help needed moving from lying on your back to sitting on the side of a flat bed without using bedrails?: A Little Help needed moving to and from a bed to a chair (including a wheelchair)?: A Little Help needed standing up from a chair using your arms (e.g., wheelchair or bedside chair)?: A Little Help needed to walk in hospital room?: A Little Help needed climbing 3-5 steps with a railing? : A Little 6 Click Score: 19    End of Session Equipment Utilized During Treatment: Gait belt Activity Tolerance: Patient tolerated treatment well;No increased pain Patient left: in chair;with call bell/phone within reach;with chair alarm set Nurse Communication: Mobility status PT Visit Diagnosis: Pain;Difficulty in walking, not elsewhere classified (R26.2) Pain - Right/Left: Left Pain - part of body: Hip     Time: 1448-1856 PT Time Calculation (min) (ACUTE ONLY): 32 min  Charges:  $Gait Training: 8-22 mins $Therapeutic Activity: 8-22 mins                     Verner Mould, DPT Acute Rehabilitation Services Office [phone]  05/16/22 2:16 PM

## 2022-05-16 NOTE — TOC Benefit Eligibility Note (Signed)
Patient Teacher, English as a foreign language completed.    The patient is currently admitted and upon discharge could be taking Xarelto 10 mg.  The current 30 day co-pay is $47.00.   The patient is insured through [place], [place] Patient Advocate Specialist [place] Patient Advocate Team Direct Number: [phone]  Fax: [phone]

## 2022-05-16 NOTE — Progress Notes (Signed)
Physical Therapy Treatment Patient Details Name: Julie Owen MRN: [id number] DOB: [birth year] Today's Date: 05/16/2022   History of Present Illness Pt is a 77yo female presenting s/p L-THA, AA on 05/15/22. PMH: anxiety & depression, DM, HTN, HLD, neuropathy, OSA on CPAP, R-THA 2012, polymyalgia rheumatic,    PT Comments    Pt making gradual progress. Tearful at start due to feeling overwhelmed that her husband may not be able to assist her well at home and that she is more limited by pain than her last hip replacement. Pt required min assist for bed mobility, transfers, and gait. She ambulated short bout to bathroom and back to recliner for a total of ~25'. EOS reviewed seated exercises for ROM and circulation. Will continue to progress pt as able during stay.   Recommendations for follow up therapy are one component of a multi-disciplinary discharge planning process, led by the attending physician.  Recommendations may be updated based on patient status, additional functional criteria and insurance authorization.  Follow Up Recommendations  Follow physician's recommendations for discharge plan and follow up therapies     Assistance Recommended at Discharge Frequent or constant Supervision/Assistance  Patient can return home with the following A little help with walking and/or transfers;A little help with bathing/dressing/bathroom;Assistance with cooking/housework;Assist for transportation;Help with stairs or ramp for entrance   Equipment Recommendations  Rolling walker (2 wheels)    Recommendations for Other Services       Precautions / Restrictions Precautions Precautions: Fall Restrictions Weight Bearing Restrictions: No LLE Weight Bearing: Weight bearing as tolerated     Mobility  Bed Mobility Overal bed mobility: Needs Assistance Bed Mobility: Supine to Sit     Supine to sit: Min assist, HOB elevated, Mod assist     General bed mobility comments: cues to use belt for Lt  LE, pt required min-mod assist to pivot with bed pad, bring LE's off EOB and raise trunk.    Transfers Overall transfer level: Needs assistance Equipment used: Rolling walker (2 wheels) Transfers: Sit to/from Stand Sit to Stand: Min assist, From elevated surface           General transfer comment: Patient required min assist for power up and to steady balance. stand completed at EOB, toilet/BSC, and recliner.    Ambulation/Gait Ambulation/Gait assistance: Min guard, Min assist Gait Distance (Feet): 25 Feet Assistive device: Rolling walker (2 wheels) Gait Pattern/deviations: Step-to pattern, Decreased step length - left, Decreased stance time - left Gait velocity: decreased     General Gait Details: Assist to negotiate tight spaces of bathroom to maintain safe position to walker and balance. Guarding throughout for safety. pt amb to bathroom and back to recliner. limtied by fatigue   Stairs             Wheelchair Mobility    Modified Rankin (Stroke Patients Only)       Balance Overall balance assessment: Needs assistance Sitting-balance support: Feet supported, No upper extremity supported Sitting balance-Leahy Scale: Good     Standing balance support: During functional activity, Bilateral upper extremity supported, Reliant on assistive device for balance Standing balance-Leahy Scale: Poor                              Cognition Arousal/Alertness: Awake/alert Behavior During Therapy: WFL for tasks assessed/performed Overall Cognitive Status: Within Functional Limits for tasks assessed  Exercises Total Joint Exercises Ankle Circles/Pumps: AROM, Both, 20 reps Quad Sets: AROM, Both, 10 reps Heel Slides: AAROM, Left, 5 reps Long Arc Quad: AROM, Left, 5 reps    General Comments        Pertinent Vitals/Pain Pain Assessment Pain Assessment: 0-10 Pain Score: 6  Pain Location: left  hip Pain Descriptors / Indicators: Operative site guarding Pain Intervention(s): Limited activity within patient's tolerance, Premedicated before session, Monitored during session, Repositioned    Home Living Family/patient expects to be discharged to:: Private residence Living Arrangements: Spouse/significant other Available Help at Discharge: Family;Available 24 hours/day Type of Home: House Home Access: Stairs to enter Entrance Stairs-Rails: Right [employer] door: 4 steps also with bilateral railings but cannot reach them at the same time.) Entrance Stairs-Number of Steps: 4   Home Layout: One level;Laundry or work area in [employer]: Civil engineer, contracting - built in;Toilet riser      Prior Function            PT Goals (current goals can now be found in the care plan section) Acute Rehab PT Goals Patient Stated Goal: Bend, clean the floor, PT Goal Formulation: With patient Time For Goal Achievement: 05/22/22 Potential to Achieve Goals: Good Progress towards PT goals: Progressing toward goals    Frequency    7X/week      PT Plan Current plan remains appropriate    Co-evaluation              AM-PAC PT "6 Clicks" Mobility   Outcome Measure  Help needed turning from your back to your side while in a flat bed without using bedrails?: None Help needed moving from lying on your back to sitting on the side of a flat bed without using bedrails?: A Little Help needed moving to and from a bed to a chair (including a wheelchair)?: A Little Help needed standing up from a chair using your arms (e.g., wheelchair or bedside chair)?: A Little Help needed to walk in hospital room?: A Little Help needed climbing 3-5 steps with a railing? : A Little 6 Click Score: 19    End of Session Equipment Utilized During Treatment: Gait belt Activity Tolerance: Patient tolerated treatment well;No increased pain Patient left: in chair;with call bell/phone within reach;with chair alarm  set Nurse Communication: Mobility status PT Visit Diagnosis: Pain;Difficulty in walking, not elsewhere classified (R26.2) Pain - Right/Left: Left Pain - part of body: Hip     Time: 0919-1000 PT Time Calculation (min) (ACUTE ONLY): 41 min  Charges:  $Gait Training: 8-22 mins $Therapeutic Exercise: 8-22 mins $Therapeutic Activity: 8-22 mins                     Verner Mould, DPT Acute Rehabilitation Services Office [phone]  05/16/22 12:39 PM

## 2022-05-16 NOTE — Progress Notes (Signed)
   Subjective: 1 Day Post-Op Procedure(s) (LRB): TOTAL HIP ARTHROPLASTY ANTERIOR APPROACH (Left) Patient seen in rounds by Dr. Wynelle Link. Patient is well, and has had no acute complaints or problems. Denies SOB or chest pain. Denies calf pain. Foley cath removed this AM. Patient reports pain as moderate. Had an increase in pain overnight but has improved. Worked with physical therapy yesterday but reported mild dizziness at the time. This did resolve once sitting. We will continue physical therapy today.   Objective: Vital signs in last 24 hours: Temp:  [97.6 F (36.4 C)-98.6 F (37 C)] 97.8 F (36.6 C) (01/04 0445) Pulse Rate:  [62-83] 76 (01/04 0445) Resp:  [10-20] 16 (01/04 0445) BP: (101-137)/(48-68) 136/64 (01/04 0445) SpO2:  [88 %-100 %] 93 % (01/04 0445) Weight:  [88.9 kg] 88.9 kg (01/03 0814)  Intake/Output from previous day:  Intake/Output Summary (Last 24 hours) at 05/16/2022 0720 Last data filed at 05/16/2022 0655 Gross per 24 hour  Intake 2830.44 ml  Output 2600 ml  Net 230.44 ml     Intake/Output this shift: No intake/output data recorded.  Labs: Recent Labs    05/16/22 0323  HGB 12.7   Recent Labs    05/16/22 0323  WBC 14.1*  RBC 4.10  HCT 39.1  PLT 190   Recent Labs    05/16/22 0323  NA 137  K 4.9  CL 105  CO2 24  BUN 14  CREATININE 0.70  GLUCOSE 180*  CALCIUM 8.6*   No results for input(s): "LABPT", "INR" in the last 72 hours.  Exam: General - Patient is Alert and Oriented Extremity - Neurologically intact Neurovascular intact Sensation intact distally Dorsiflexion/Plantar flexion intact Dressing - dressing C/D/I Motor Function - intact, moving foot and toes well on exam.  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   Depression    Diabetes mellitus without complication (HCC)    Fatty liver    History of kidney stones     Hyperlipidemia    Hypertension    Neuromuscular disorder (HCC)    neuropathy   Obesity    Osteoarthritis of lumbar spine    Osteoarthritis of thumb    bilateral   Pneumonia    Prediabetes    Renal disorder    kidney stones   Sleep apnea    uses CPAP    Assessment/Plan: 1 Day Post-Op Procedure(s) (LRB): TOTAL HIP ARTHROPLASTY ANTERIOR APPROACH (Left) Principal Problem:   OA (osteoarthritis) of hip Active Problems:   Osteoarthritis of left hip  Estimated body mass index is 32.62 kg/m as calculated from the following:   Height as of this encounter: '5\' 5"'$  (1.651 m).   Weight as of this encounter: 88.9 kg. Advance diet Up with therapy D/C IV fluids  DVT Prophylaxis - Xarelto Weight bearing as tolerated.  Continue physical therapy. Possible discharge home today pending progress with physical therapy and symptom management but may require additional night in hospital. Plan for HEP once discharged.  Rainey Pines, PA-C Orthopedic Surgery [phone] 05/16/2022, 7:20 AM

## 2022-05-17 ENCOUNTER — Encounter (HOSPITAL_COMMUNITY): Payer: Self-pay | Admitting: Orthopedic Surgery

## 2022-05-17 DIAGNOSIS — M1612 Unilateral primary osteoarthritis, left hip: Secondary | ICD-10-CM | POA: Diagnosis not present

## 2022-05-17 LAB — GLUCOSE, CAPILLARY
Glucose-Capillary: 123 mg/dL — ABNORMAL HIGH (ref 70–99)
Glucose-Capillary: 126 mg/dL — ABNORMAL HIGH (ref 70–99)

## 2022-05-17 LAB — CBC
HCT: 34.7 % — ABNORMAL LOW (ref 36.0–46.0)
Hemoglobin: 11.2 g/dL — ABNORMAL LOW (ref 12.0–15.0)
MCH: 31.1 pg (ref 26.0–34.0)
MCHC: 32.3 g/dL (ref 30.0–36.0)
MCV: 96.4 fL (ref 80.0–100.0)
Platelets: 180 10*3/uL (ref 150–400)
RBC: 3.6 MIL/uL — ABNORMAL LOW (ref 3.87–5.11)
RDW: 13.5 % (ref 11.5–15.5)
WBC: 10.8 10*3/uL — ABNORMAL HIGH (ref 4.0–10.5)
nRBC: 0 % (ref 0.0–0.2)

## 2022-05-17 MED ORDER — RIVAROXABAN 10 MG PO TABS: 10.0000 mg | ORAL_TABLET | Freq: Every day | ORAL | 0 refills | Status: AC

## 2022-05-17 MED ORDER — HYDROMORPHONE HCL 2 MG PO TABS: 2.0000 mg | ORAL_TABLET | Freq: Four times a day (QID) | ORAL | 0 refills | Status: DC | PRN

## 2022-05-17 MED ORDER — METHOCARBAMOL 500 MG PO TABS: 500.0000 mg | ORAL_TABLET | Freq: Four times a day (QID) | ORAL | 0 refills | Status: DC | PRN

## 2022-05-17 NOTE — Plan of Care (Signed)
  Problem: Education: Goal: Ability to describe self-care measures that may prevent or decrease complications (Diabetes Survival Skills Education) will improve Outcome: Progressing   Problem: Health Behavior/Discharge Planning: Goal: Ability to identify and utilize available resources and services will improve Outcome: Progressing   Problem: Metabolic: Goal: Ability to maintain appropriate glucose levels will improve Outcome: Progressing   Problem: Nutritional: Goal: Progress toward achieving an optimal weight will improve Outcome: Progressing   Problem: Skin Integrity: Goal: Risk for impaired skin integrity will decrease Outcome: Progressing

## 2022-05-17 NOTE — Plan of Care (Signed)
  Problem: Coping: Goal: Level of anxiety will decrease Outcome: Progressing   Problem: Pain Managment: Goal: General experience of comfort will improve Outcome: Progressing   Problem: Safety: Goal: Ability to remain free from injury will improve Outcome: Progressing   

## 2022-05-17 NOTE — Progress Notes (Signed)
Physical Therapy Treatment Patient Details Name: Julie Owen MRN: [id number] DOB: [birth year] Today's Date: 05/17/2022   History of Present Illness Pt is a 77 y.o. female presenting s/p L-THA, AA on 05/15/22. PMH: anxiety & depression, DM, HTN, HLD, neuropathy, OSA on CPAP, R-THA 2012, polymyalgia rheumatic,    PT Comments    Patient demonstrates good carryover for safe technique with belt for bed mobility and proper walker management with transfers and gait. Min guard throughout with no LOB noted during transfers and gait. Pt ambulated ~60' but distance limited today by slight dizziness. EOS reviewed seated HEP. Will follow up for second visit to complete stair training in preparation for safe discharge home.   Recommendations for follow up therapy are one component of a multi-disciplinary discharge planning process, led by the attending physician.  Recommendations may be updated based on patient status, additional functional criteria and insurance authorization.  Follow Up Recommendations  Follow physician's recommendations for discharge plan and follow up therapies     Assistance Recommended at Discharge Frequent or constant Supervision/Assistance  Patient can return home with the following A little help with walking and/or transfers;A little help with bathing/dressing/bathroom;Assistance with cooking/housework;Assist for transportation;Help with stairs or ramp for entrance   Equipment Recommendations  Rolling walker (2 wheels)    Recommendations for Other Services       Precautions / Restrictions Precautions Precautions: Fall Restrictions Weight Bearing Restrictions: No LLE Weight Bearing: Weight bearing as tolerated     Mobility  Bed Mobility Overal bed mobility: Needs Assistance Bed Mobility: Supine to Sit     Supine to sit: Min assist, HOB elevated, Min guard     General bed mobility comments: pt initiated moving LE's to EOB with belt and raising trunk with bil UE  use. light assist to fully sit up required.    Transfers Overall transfer level: Needs assistance Equipment used: Rolling walker (2 wheels) Transfers: Sit to/from Stand Sit to Stand: Min guard, From elevated surface           General transfer comment: pt with good recall to use bil UE for power up. guarding for safety, EOB required elevated height.    Ambulation/Gait Ambulation/Gait assistance: Min guard Gait Distance (Feet): 60 Feet Assistive device: Rolling walker (2 wheels) Gait Pattern/deviations: Step-to pattern, Decreased step length - left, Decreased stance time - left Gait velocity: decreased     General Gait Details: guarding for safety, pt able to maitnain safe walker position. no LOB noted. distance limited due to slight dizziness today.   Stairs             Wheelchair Mobility    Modified Rankin (Stroke Patients Only)       Balance Overall balance assessment: Needs assistance Sitting-balance support: Feet supported, No upper extremity supported Sitting balance-Leahy Scale: Good     Standing balance support: During functional activity, Bilateral upper extremity supported, Reliant on assistive device for balance Standing balance-Leahy Scale: Poor                              Cognition Arousal/Alertness: Awake/alert Behavior During Therapy: WFL for tasks assessed/performed Overall Cognitive Status: Within Functional Limits for tasks assessed                                          Exercises Total Joint Exercises Ankle  Circles/Pumps: AROM, Both, 10 reps Quad Sets: AROM, Both, 10 reps Heel Slides: AROM, Both, 10 reps Long Arc Quad: AROM, Both, 10 reps Knee Flexion: AROM, Left, 5 reps, Standing    General Comments General comments (skin integrity, edema, etc.): pt coughing more and having trouble with IS      Pertinent Vitals/Pain Pain Assessment Pain Assessment: 0-10 Pain Score: 3  Pain Location: left  hip Pain Descriptors / Indicators: Operative site guarding Pain Intervention(s): Limited activity within patient's tolerance, Premedicated before session, Monitored during session, Repositioned    Home Living                          Prior Function            PT Goals (current goals can now be found in the care plan section) Acute Rehab PT Goals Patient Stated Goal: Bend, clean the floor, PT Goal Formulation: With patient Time For Goal Achievement: 05/22/22 Potential to Achieve Goals: Good Progress towards PT goals: Progressing toward goals    Frequency    7X/week      PT Plan Current plan remains appropriate    Co-evaluation              AM-PAC PT "6 Clicks" Mobility   Outcome Measure  Help needed turning from your back to your side while in a flat bed without using bedrails?: None Help needed moving from lying on your back to sitting on the side of a flat bed without using bedrails?: A Little Help needed moving to and from a bed to a chair (including a wheelchair)?: A Little Help needed standing up from a chair using your arms (e.g., wheelchair or bedside chair)?: A Little Help needed to walk in hospital room?: A Little Help needed climbing 3-5 steps with a railing? : A Little 6 Click Score: 19    End of Session Equipment Utilized During Treatment: Gait belt Activity Tolerance: Patient tolerated treatment well;No increased pain Patient left: in chair;with call bell/phone within reach;with chair alarm set Nurse Communication: Mobility status PT Visit Diagnosis: Pain;Difficulty in walking, not elsewhere classified (R26.2) Pain - Right/Left: Left Pain - part of body: Hip     Time: 9735-3299 PT Time Calculation (min) (ACUTE ONLY): 21 min  Charges:  $Therapeutic Exercise: 8-22 mins                     Verner Mould, DPT Acute Rehabilitation Services Office [phone]  05/17/22 12:21 PM

## 2022-05-17 NOTE — Progress Notes (Signed)
Provided discharge education/instructions, all questions and concerns addressed. Pt not in acute distress, discharged home with belongings accompanied by husband. 

## 2022-05-17 NOTE — Progress Notes (Signed)
Physical Therapy Treatment Patient Details Name: Julie Owen MRN: [id number] DOB: 14-Jun-1945 Today's Date: 05/17/2022   History of Present Illness Pt is a 77 y.o. female presenting s/p L-THA, AA on 05/15/22. PMH: anxiety & depression, DM, HTN, HLD, neuropathy, OSA on CPAP, R-THA 2012, polymyalgia rheumatic,    PT Comments    Patient making good progress with mobility, continues to report some mild woozy/"foolish" headed sensation with transfers but reports it improved as she mobilizes. Pt completed gait and stair mobility with min guard for safety and cues for step pattern. Pt used two rails but reports she has one at home, educated on bil HH on single rail for stair negotiation at home. EOS pt requested use of bathroom, amb in room with supervision and maintained safe walker position. Returned to bed for rest at EOS. Will continue to progress during acute stay.   Recommendations for follow up therapy are one component of a multi-disciplinary discharge planning process, led by the attending physician.  Recommendations may be updated based on patient status, additional functional criteria and insurance authorization.  Follow Up Recommendations  Follow physician's recommendations for discharge plan and follow up therapies     Assistance Recommended at Discharge Frequent or constant Supervision/Assistance  Patient can return home with the following A little help with walking and/or transfers;A little help with bathing/dressing/bathroom;Assistance with cooking/housework;Assist for transportation;Help with stairs or ramp for entrance   Equipment Recommendations  Rolling walker (2 wheels)    Recommendations for Other Services       Precautions / Restrictions Precautions Precautions: Fall Restrictions Weight Bearing Restrictions: No LLE Weight Bearing: Weight bearing as tolerated     Mobility  Bed Mobility Overal bed mobility: Needs Assistance Bed Mobility: Supine to Sit     Supine  to sit: Min assist, HOB elevated, Min guard     General bed mobility comments: light assist continues to be needed to fully raise trunk from minimally elevated HOB. pt's bed at home is adjustable.    Transfers Overall transfer level: Needs assistance Equipment used: Rolling walker (2 wheels) Transfers: Sit to/from Stand Sit to Stand: Min guard, From elevated surface           General transfer comment: guard for power up, EOB slightly elevated. pt able to stand from recliner and toilet with guarding only.    Ambulation/Gait Ambulation/Gait assistance: Min guard Gait Distance (Feet): 140 Feet Assistive device: Rolling walker (2 wheels) Gait Pattern/deviations: Step-to pattern, Decreased step length - left, Decreased stance time - left Gait velocity: decreased     General Gait Details: overall steady, pt c/o "foolish" feeling in head, or slightly dizzy but stated it improved with gait.   Stairs Stairs: Yes Stairs assistance: Min guard Stair Management: Two rails, Step to pattern, Forwards Number of Stairs: 3 General stair comments: cues for sequencing step patterm up with good, down with bad. no LOB noted.   Wheelchair Mobility    Modified Rankin (Stroke Patients Only)       Balance Overall balance assessment: Needs assistance Sitting-balance support: Feet supported, No upper extremity supported Sitting balance-Leahy Scale: Good     Standing balance support: During functional activity, Bilateral upper extremity supported, Reliant on assistive device for balance Standing balance-Leahy Scale: Poor                              Cognition Arousal/Alertness: Awake/alert Behavior During Therapy: WFL for tasks assessed/performed Overall Cognitive Status: Within  Functional Limits for tasks assessed                                          Exercises Total Joint Exercises Ankle Circles/Pumps: AROM, Both, 10 reps Quad Sets: AROM, Both, 10  reps Heel Slides: AROM, Both, 10 reps Long Arc Quad: AROM, Both, 10 reps Knee Flexion: AROM, Left, 5 reps, Standing    General Comments        Pertinent Vitals/Pain Pain Assessment Pain Assessment: 0-10 Pain Score: 4  Pain Location: left hip Pain Descriptors / Indicators: Operative site guarding Pain Intervention(s): Limited activity within patient's tolerance, Monitored during session, Repositioned    Home Living                          Prior Function            PT Goals (current goals can now be found in the care plan section) Acute Rehab PT Goals Patient Stated Goal: Bend, clean the floor, PT Goal Formulation: With patient Time For Goal Achievement: 05/22/22 Potential to Achieve Goals: Good Progress towards PT goals: Progressing toward goals    Frequency    7X/week      PT Plan Current plan remains appropriate    Co-evaluation              AM-PAC PT "6 Clicks" Mobility   Outcome Measure  Help needed turning from your back to your side while in a flat bed without using bedrails?: None Help needed moving from lying on your back to sitting on the side of a flat bed without using bedrails?: A Little Help needed moving to and from a bed to a chair (including a wheelchair)?: A Little Help needed standing up from a chair using your arms (e.g., wheelchair or bedside chair)?: A Little Help needed to walk in hospital room?: A Little Help needed climbing 3-5 steps with a railing? : A Little 6 Click Score: 19    End of Session Equipment Utilized During Treatment: Gait belt Activity Tolerance: Patient tolerated treatment well;No increased pain Patient left: in chair;with call bell/phone within reach;with chair alarm set Nurse Communication: Mobility status PT Visit Diagnosis: Pain;Difficulty in walking, not elsewhere classified (R26.2) Pain - Right/Left: Left Pain - part of body: Hip     Time: 4097-3532 PT Time Calculation (min) (ACUTE ONLY):  29 min  Charges:  $Gait Training: 23-37 mins                    Verner Mould, [place] Office [phone]  05/17/22 2:53 PM

## 2022-05-17 NOTE — Progress Notes (Signed)
   Subjective: 2 Days Post-Op Procedure(s) (LRB): TOTAL HIP ARTHROPLASTY ANTERIOR APPROACH (Left) Patient seen in rounds by Dr. Wynelle Link. Patient is well, and has had no acute complaints or problems. Denies SOB or chest pain. Denies calf pain. Voiding without difficulty. Patient reports pain as mild.  Worked with physical therapy yesterday and ambulated 160'.  Objective: Vital signs in last 24 hours: Temp:  [98.2 F (36.8 C)-98.7 F (37.1 C)] 98.5 F (36.9 C) (01/05 0544) Pulse Rate:  [66-78] 73 (01/05 0544) Resp:  [18] 18 (01/05 0544) BP: (119-178)/(52-65) 156/59 (01/05 0544) SpO2:  [94 %-97 %] 97 % (01/05 0544)  Intake/Output from previous day:  Intake/Output Summary (Last 24 hours) at 05/17/2022 0708 Last data filed at 05/16/2022 2200 Gross per 24 hour  Intake 960 ml  Output 500 ml  Net 460 ml    Intake/Output this shift: No intake/output data recorded.  Labs: Recent Labs    05/16/22 0323 05/17/22 0330  HGB 12.7 11.2*   Recent Labs    05/16/22 0323 05/17/22 0330  WBC 14.1* 10.8*  RBC 4.10 3.60*  HCT 39.1 34.7*  PLT 190 180   Recent Labs    05/16/22 0323  NA 137  K 4.9  CL 105  CO2 24  BUN 14  CREATININE 0.70  GLUCOSE 180*  CALCIUM 8.6*   No results for input(s): "LABPT", "INR" in the last 72 hours.  Exam: General - Patient is Alert and Oriented Extremity - Neurologically intact Neurovascular intact Sensation intact distally Dorsiflexion/Plantar flexion intact Dressing/Incision - clean, dry, no drainage Motor Function - intact, moving foot and toes well on exam.  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   Depression    Diabetes mellitus without complication (HCC)    Fatty liver    History of kidney stones    Hyperlipidemia    Hypertension    Neuromuscular disorder (HCC)    neuropathy   Obesity    Osteoarthritis of lumbar spine     Osteoarthritis of thumb    bilateral   Pneumonia    Prediabetes    Renal disorder    kidney stones   Sleep apnea    uses CPAP    Assessment/Plan: 2 Days Post-Op Procedure(s) (LRB): TOTAL HIP ARTHROPLASTY ANTERIOR APPROACH (Left) Principal Problem:   OA (osteoarthritis) of hip Active Problems:   Osteoarthritis of left hip  Estimated body mass index is 32.62 kg/m as calculated from the following:   Height as of this encounter: '5\' 5"'$  (1.651 m).   Weight as of this encounter: 88.9 kg.  DVT Prophylaxis - Xarelto Weight-bearing as tolerated.  Continue with physical therapy today. Expected discharge home today pending progress with physical therapy and if meeting patient goals. Plan for HEP once discharged. Follow-up in clinic in 2 weeks.  The PDMP database was reviewed today prior to any opioid medications being prescribed to this patient.  R. Jaynie Bream, PA-C Orthopedic Surgery [phone] 05/17/2022, 7:08 AM

## 2022-05-20 NOTE — Discharge Summary (Signed)
Physician Discharge Summary   Patient ID: Julie Owen MRN: [id number] DOB/AGE: 1945/09/25 77 y.o.  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651 m).   Weight as of this encounter: 88.9 kg.  Procedure:  Procedure(s) (LRB): TOTAL HIP ARTHROPLASTY ANTERIOR APPROACH (Left)   Consults: None  HPI: Julie Owen is a 77 y.o. female who has advanced end-  stage arthritis of their Left  hip with progressively worsening pain and dysfunction.The patient has failed nonoperative management and presents for  total hip arthroplasty.   Laboratory Data: Admission on 05/15/2022, Discharged on 05/17/2022  Component Date Value Ref Range Status   Glucose-Capillary 05/15/2022 173 (H)  70 - 99 mg/dL Final   Glucose reference range applies only to samples taken after fasting for at least 8 hours.   Comment 1 05/15/2022 Notify RN   Final   Comment 2 05/15/2022 Document in Chart   Final   Glucose-Capillary 05/15/2022 133 (H)  70 - 99 mg/dL Final   Glucose reference range applies only to samples taken after fasting for at least 8 hours.   WBC 05/16/2022 14.1 (H)  4.0 - 10.5  K/uL Final   RBC 05/16/2022 4.10  3.87 - 5.11 MIL/uL Final   Hemoglobin 05/16/2022 12.7  12.0 - 15.0 g/dL Final   HCT 05/16/2022 39.1  36.0 - 46.0 % Final   MCV 05/16/2022 95.4  80.0 - 100.0 fL Final   MCH 05/16/2022 31.0  26.0 - 34.0 pg Final   MCHC 05/16/2022 32.5  30.0 - 36.0 g/dL Final   RDW 05/16/2022 13.0  11.5 - 15.5 % Final   Platelets 05/16/2022 190  150 - 400 K/uL Final   nRBC 05/16/2022 0.0  0.0 - 0.2 % Final   Performed at [facility], [place] [address], [place], [place] [address]   Sodium 05/16/2022 137  135 - 145 mmol/L Final   Potassium 05/16/2022 4.9  3.5 - 5.1 mmol/L Final   Chloride 05/16/2022 105  98 - 111 mmol/L Final   CO2 05/16/2022 24  22 - 32 mmol/L Final   Glucose, Bld 05/16/2022 180 (H)  70 - 99 mg/dL Final   Glucose reference range applies only to samples taken after fasting for at least 8 hours.   BUN 05/16/2022 14  8 - 23 mg/dL Final   Creatinine, Ser 05/16/2022 0.70  0.44 - 1.00 mg/dL Final   Calcium 05/16/2022 8.6 (L)  8.9 - 10.3 mg/dL Final  GFR, Estimated 05/16/2022 >60  >60 mL/min Final   Comment: (NOTE) Calculated using the CKD-EPI Creatinine Equation (2021)    Anion gap 05/16/2022 8  5 - 15 Final   Performed at [facility], [place] [address], [place], [place] [address]   Glucose-Capillary 05/15/2022 215 (H)  70 - 99 mg/dL Final   Glucose reference range applies only to samples taken after fasting for at least 8 hours.   Glucose-Capillary 05/15/2022 185 (H)  70 - 99 mg/dL Final   Glucose reference range applies only to samples taken after fasting for at least 8 hours.   Glucose-Capillary 05/16/2022 158 (H)  70 - 99 mg/dL Final   Glucose reference range applies only to samples taken after fasting for at least 8 hours.   Glucose-Capillary 05/16/2022 167 (H)  70 - 99 mg/dL Final   Glucose reference range applies only to samples taken after fasting for at least 8 hours.   WBC 05/17/2022 10.8 (H)  4.0 - 10.5 K/uL Final    RBC 05/17/2022 3.60 (L)  3.87 - 5.11 MIL/uL Final   Hemoglobin 05/17/2022 11.2 (L)  12.0 - 15.0 g/dL Final   HCT 05/17/2022 34.7 (L)  36.0 - 46.0 % Final   MCV 05/17/2022 96.4  80.0 - 100.0 fL Final   MCH 05/17/2022 31.1  26.0 - 34.0 pg Final   MCHC 05/17/2022 32.3  30.0 - 36.0 g/dL Final   RDW 05/17/2022 13.5  11.5 - 15.5 % Final   Platelets 05/17/2022 180  150 - 400 K/uL Final   nRBC 05/17/2022 0.0  0.0 - 0.2 % Final   Performed at [facility], [place] [address], [place], [place] [address]   Glucose-Capillary 05/16/2022 122 (H)  70 - 99 mg/dL Final   Glucose reference range applies only to samples taken after fasting for at least 8 hours.   Glucose-Capillary 05/16/2022 171 (H)  70 - 99 mg/dL Final   Glucose reference range applies only to samples taken after fasting for at least 8 hours.   Glucose-Capillary 05/17/2022 123 (H)  70 - 99 mg/dL Final   Glucose reference range applies only to samples taken after fasting for at least 8 hours.   Glucose-Capillary 05/17/2022 126 (H)  70 - 99 mg/dL Final   Glucose reference range applies only to samples taken after fasting for at least 8 hours.  Hospital Outpatient Visit on 05/08/2022  Component Date Value Ref Range Status   MRSA, PCR 05/08/2022 NEGATIVE  NEGATIVE Final   Staphylococcus aureus 05/08/2022 NEGATIVE  NEGATIVE Final   Comment: (NOTE) The Xpert SA Assay (FDA approved for NASAL specimens in patients 87 years of age and older), is one component of a comprehensive surveillance program. It is not intended to diagnose infection nor to guide or monitor treatment. Performed at [facility], [place] [address], [place], [place] [address]    Hgb A1c MFr Bld 05/08/2022 6.9 (H)  4.8 - 5.6 % Final   Comment: (NOTE)         Prediabetes: 5.7 - 6.4         Diabetes: >6.4         Glycemic control for adults with diabetes: <7.0    Mean Plasma Glucose 05/08/2022 151  mg/dL Final   Comment: (NOTE) Performed  At: [facility] [place], [place] [id number] Rush Farmer MD OZ:[id number]    Sodium 05/08/2022 139  135 - 145 mmol/L Final   Potassium 05/08/2022 4.3  3.5 - 5.1 mmol/L Final   Chloride 05/08/2022 108  98 - 111 mmol/L Final   CO2 05/08/2022 25  22 - 32 mmol/L Final   Glucose, Bld 05/08/2022 148 (H)  70 - 99 mg/dL Final   Glucose reference range applies only to samples taken after fasting for at least 8 hours.   BUN 05/08/2022 15  8 - 23 mg/dL Final   Creatinine, Ser 05/08/2022 0.76  0.44 - 1.00 mg/dL Final   Calcium 05/08/2022 9.0  8.9 - 10.3 mg/dL Final   GFR, Estimated 05/08/2022 >60  >60 mL/min Final   Comment: (NOTE) Calculated using the CKD-EPI Creatinine Equation (2021)    Anion gap 05/08/2022 6  5 - 15 Final   Performed at [facility], [place] [address], [place], [place] [address]   WBC 05/08/2022 8.2  4.0 - 10.5 K/uL Final   RBC 05/08/2022 4.42  3.87 - 5.11 MIL/uL Final   Hemoglobin 05/08/2022 13.7  12.0 - 15.0 g/dL Final   HCT 05/08/2022 42.2  36.0 - 46.0 % Final   MCV 05/08/2022 95.5  80.0 - 100.0 fL Final   MCH 05/08/2022 31.0  26.0 - 34.0 pg Final   MCHC 05/08/2022 32.5  30.0 - 36.0 g/dL Final   RDW 05/08/2022 13.5  11.5 - 15.5 % Final   Platelets 05/08/2022 210  150 - 400 K/uL Final   nRBC 05/08/2022 0.0  0.0 - 0.2 % Final   Performed at [facility], [place] [address], [place], [place] [address]   ABO/RH(D) 05/08/2022 A POS   Final   Antibody Screen 05/08/2022 NEG   Final   Sample Expiration 05/08/2022 05/18/2022,2359   Final   Extend sample reason 05/08/2022    Final                   Value:NO TRANSFUSIONS OR PREGNANCY IN THE PAST 3 MONTHS Performed at [place] [address] [place],  [place] [address]    Glucose-Capillary 05/08/2022 144 (H)  70 - 99 mg/dL Final   Glucose reference range applies only to samples taken after fasting for at least 8 hours.  Lab on 04/24/2022   Component Date Value Ref Range Status   Urine Culture, Routine 04/24/2022 Final report (A)   Final   Organism ID, Bacteria 04/24/2022 Klebsiella pneumoniae (A)   Final   50,000-100,000 colony forming units per mL   Antimicrobial Susceptibility 04/24/2022 Comment   Final   Comment:       ** S = Susceptible; I = Intermediate; R = Resistant **                    P = Positive; N = Negative             MICS are expressed in micrograms per mL    Antibiotic                 RSLT#1    RSLT#2    RSLT#3    RSLT#4 Amoxicillin/Clavulanic Acid    S Ampicillin                     R Cefazolin                      R Cefepime                       R Ceftriaxone  R Cefuroxime                     R Ciprofloxacin                  R Ertapenem                      S Gentamicin                     S Imipenem                       S Levofloxacin                   R Meropenem                      S Nitrofurantoin                 R Piperacillin/Tazobactam        S Tetracycline                   R Tobramycin                     S Trimethoprim/Sulfa             R    Specific Gravity, UA 04/24/2022 1.020  1.005 - 1.030 Final   pH, UA 04/24/2022 5.0  5.0 - 7.5 Final   Color, UA 04/24/2022 Yellow  Yellow Final   Appearance Ur 04/24/2022 Cloudy (A)  Clear Final   Leukocytes,UA 04/24/2022 2+ (A)  Negative Final   Protein,UA 04/24/2022 Trace  Negative/Trace Final   Glucose, UA 04/24/2022 Negative  Negative Final   Ketones, UA 04/24/2022 Negative  Negative Final   RBC, UA 04/24/2022 Trace (A)  Negative Final   Bilirubin, UA 04/24/2022 Negative  Negative Final   Urobilinogen, Ur 04/24/2022 0.2  0.2 - 1.0 mg/dL Final   Nitrite, UA 04/24/2022 Positive (A)  Negative Final   Microscopic Examination 04/24/2022 See below:   Final   Microscopic was indicated and was performed.   WBC, UA 04/24/2022 >30 (A)  0 - 5 /hpf Final   RBC, Urine 04/24/2022 3-10 (A)  0 - 2 /hpf Final   Epithelial Cells (non  renal) 04/24/2022 0-10  0 - 10 /hpf Final   Bacteria, UA 04/24/2022 Moderate (A)  None seen/Few Final     X-Rays:DG Pelvis Portable  Result Date: 05/15/2022 CLINICAL DATA:  Status post hip replacement EXAM: PORTABLE PELVIS 1-2 VIEWS COMPARISON:  05/15/2022 FINDINGS: Postsurgical changes of left hip arthroplasty. Normal alignment. No evidence of fracture. Expected soft tissue changes. No evidence of right hip arthroplasty complication. Heterotopic ossification adjacent to the right greater trochanter. IMPRESSION: Postsurgical changes of left hip arthroplasty. No evidence of immediate hardware complication. Electronically Signed   By: Maurine Simmering M.D.   On: 05/15/2022 12:15   DG HIP UNILAT WITH PELVIS 1V LEFT  Result Date: 05/15/2022 CLINICAL DATA:  Left hip replacement EXAM: DG HIP (WITH OR WITHOUT PELVIS) 1V*L* COMPARISON:  CT abdomen/pelvis 05/11/2015 FINDINGS: Seven C-arm fluoroscopic images were obtained intraoperatively and submitted for post operative interpretation. The initial image demonstrates degenerative changes about the left hip. Subsequent images demonstrate postsurgical changes reflecting left hip arthroplasty. Hardware alignment is within expected limits on subsequent studies, without evidence of immediate complication. Postsurgical changes reflecting prior right hip arthroplasty are also noted.  The left SI joint and symphysis pubis are intact. Fluoro time 7 seconds, dose 1.12 mGy. Please see the performing provider's procedural report for further detail. IMPRESSION: Status post left hip arthroplasty. Electronically Signed   By: Valetta Mole M.D.   On: 05/15/2022 11:19   DG C-Arm 1-60 Min-No Report  Result Date: 05/15/2022 Fluoroscopy was utilized by the requesting physician.  No radiographic interpretation.   DG C-Arm 1-60 Min-No Report  Result Date: 05/15/2022 Fluoroscopy was utilized by the requesting physician.  No radiographic interpretation.    EKG: Orders placed or performed  in visit on 11/21/21   EKG 12-Lead     Hospital Course: Julie Owen is a 77 y.o. who was admitted to [facility]. They were brought to the operating room on 05/15/2022 and underwent Procedure(s): [place].  Patient tolerated the procedure well and was later transferred to the recovery room and then to the orthopaedic floor for postoperative care. They were given PO and IV analgesics for pain control following their surgery. They were given 24 hours of postoperative antibiotics of  Anti-infectives (From admission, onward)    Start     Dose/Rate Route Frequency Ordered Stop   05/15/22 1600  ceFAZolin (ANCEF) IVPB 2g/100 mL premix        2 g 200 mL/hr over 30 Minutes Intravenous Every 6 hours 05/15/22 1311 05/15/22 2231   05/15/22 0745  ceFAZolin (ANCEF) IVPB 2g/100 mL premix        2 g 200 mL/hr over 30 Minutes Intravenous On call to O.R. 05/15/22 1610 05/15/22 1011      and started on DVT prophylaxis in the form of Xarelto.   PT and OT were ordered for total joint protocol. Discharge planning consulted to help with post-op disposition and equipment needs. Patient had a fair night on the evening of surgery. They started to get up OOB with physical therapy on POD #0. Continued to work with physical therapy into POD #2. Patient was seen during rounds on day two and was ready to go home pending progress with physical therapy. Patient worked with physical therapy for a total of 5 sessions and was meeting their goals. Dressing was changed and the incision was C/D/I.  They were discharged home later that day in stable condition.  Diet: Diabetic diet Activity: WBAT Follow-up: in 2 weeks Disposition: Home Discharged Condition: stable   Discharge Instructions     Call MD / Call 911   Complete by: As directed    If you experience chest pain or shortness of breath, CALL 911 and be transported to the hospital emergency room.  If you develope a fever above 101  F, pus (white drainage) or increased drainage or redness at the wound, or calf pain, call your surgeon's office.   Change dressing   Complete by: As directed    You have an adhesive waterproof bandage over the incision. Leave this in place until your first follow-up appointment. Once you remove this you will not need to place another bandage.   Constipation Prevention   Complete by: As directed    Drink plenty of fluids.  Prune juice may be helpful.  You may use a stool softener, such as Colace (over the counter) 100 mg twice a day.  Use MiraLax (over the counter) for constipation as needed.   Diet - low sodium heart healthy   Complete by: As directed    Do not sit on low chairs, stoools or toilet  seats, as it may be difficult to get up from low surfaces   Complete by: As directed    Driving restrictions   Complete by: As directed    No driving for two weeks   Post-operative opioid taper instructions:   Complete by: As directed    POST-OPERATIVE OPIOID TAPER INSTRUCTIONS: It is important to wean off of your opioid medication as soon as possible. If you do not need pain medication after your surgery it is ok to stop day one. Opioids include: Codeine, Hydrocodone(Norco, Vicodin), Oxycodone(Percocet, oxycontin) and hydromorphone amongst others.  Long term and even short term use of opiods can cause: Increased pain response Dependence Constipation Depression Respiratory depression And more.  Withdrawal symptoms can include Flu like symptoms Nausea, vomiting And more Techniques to manage these symptoms Hydrate well Eat regular healthy meals Stay active Use relaxation techniques(deep breathing, meditating, yoga) Do Not substitute Alcohol to help with tapering If you have been on opioids for less than two weeks and do not have pain than it is ok to stop all together.  Plan to wean off of opioids This plan should start within one week post op of your joint replacement. Maintain the  same interval or time between taking each dose and first decrease the dose.  Cut the total daily intake of opioids by one tablet each day Next start to increase the time between doses. The last dose that should be eliminated is the evening dose.      TED hose   Complete by: As directed    Use stockings (TED hose) for three weeks on both leg(s).  You may remove them at night for sleeping.   Weight bearing as tolerated   Complete by: As directed       Allergies as of 05/17/2022       Reactions   Clavulanic Acid Shortness Of Breath   Tolerates Amoxicillin for dental procedures   Diclofenac Shortness Of Breath   Aspirin Other (See Comments)   Abdominal pain   Benzocaine    Caffeine    Panic attacks   Codeine Swelling   Duloxetine Hcl    Other reaction(s): Ineffective for depression   Lidocaine-epinephrine Other (See Comments)   Oxycodone-acetaminophen Swelling   Percocet [oxycodone-acetaminophen] Swelling   Pravastatin    myalgia   Procaine Hcl    Pseudoephedrine Hcl Er Other (See Comments)   Jittery, nervousness   Simvastatin    REACTION: hair loss   Venlafaxine    Other reaction(s): Ineffective for depression        Medication List     TAKE these medications    calcium-vitamin D 500-200 MG-UNIT tablet Commonly known as: OSCAL WITH D Take 1 tablet by mouth daily. Notes to patient: Resume home regimen   cycloSPORINE 0.05 % ophthalmic emulsion Commonly known as: RESTASIS Place 1 drop into both eyes 2 (two) times daily.   ezetimibe 10 MG tablet Commonly known as: ZETIA Take 10 mg by mouth daily.   HYDROmorphone 2 MG tablet Commonly known as: DILAUDID Take 1-2 tablets (2-4 mg total) by mouth every 6 (six) hours as needed for moderate pain or severe pain. Notes to patient: Last dose given 01/05 11:11am   losartan 50 MG tablet Commonly known as: Cozaar Take 1 tablet (50 mg total) by mouth 2 (two) times daily.   metFORMIN 500 MG 24 hr tablet Commonly known  as: GLUCOPHAGE-XR Take 500 mg by mouth at bedtime. Notes to patient: Resume home regimen   methocarbamol  500 MG tablet Commonly known as: ROBAXIN Take 1 tablet (500 mg total) by mouth every 6 (six) hours as needed for muscle spasms. Notes to patient: Last dose given 01/04 05:24pm   metroNIDAZOLE 0.75 % gel Commonly known as: METROGEL Apply 1 application topically 2 (two) times daily. Notes to patient: Resume home regimen   NON FORMULARY Pt uses a cpap nightly   rivaroxaban 10 MG Tabs tablet Commonly known as: XARELTO Take 1 tablet (10 mg total) by mouth daily with breakfast for 19 days. Then discontinue.   rosuvastatin 10 MG tablet Commonly known as: CRESTOR Take 10 mg by mouth every Monday, Wednesday, and Friday. Notes to patient: Resume home regimen   SYSTANE OP Place 1 drop into both eyes as needed (dry eyes). Notes to patient: Resume home regimen   VITAMIN C PO Take 1 tablet by mouth daily. Notes to patient: Resume home regimen               Discharge Care Instructions  (From admission, onward)           Start     Ordered   05/17/22 0000  Weight bearing as tolerated        05/17/22 0712   05/17/22 0000  Change dressing       Comments: You have an adhesive waterproof bandage over the incision. Leave this in place until your first follow-up appointment. Once you remove this you will not need to place another bandage.   05/17/22 9798            Follow-up Information     Gaynelle Arabian, MD. Schedule an appointment as soon as possible for a visit in 2 week(s).   Specialty: Orthopedic Surgery Contact information: [address] [place] [address] [phone]                 Signed: R. Jaynie Bream, PA-C Orthopedic Surgery 05/20/2022, 3:19 PM

## 2022-09-05 ENCOUNTER — Other Ambulatory Visit: Payer: Self-pay | Admitting: Cardiology

## 2022-10-18 ENCOUNTER — Other Ambulatory Visit (HOSPITAL_COMMUNITY): Payer: Self-pay | Admitting: Internal Medicine

## 2022-10-18 DIAGNOSIS — Z1231 Encounter for screening mammogram for malignant neoplasm of breast: Secondary | ICD-10-CM

## 2022-11-11 ENCOUNTER — Encounter (HOSPITAL_COMMUNITY): Payer: Self-pay

## 2022-11-11 ENCOUNTER — Ambulatory Visit (HOSPITAL_COMMUNITY)
Admission: RE | Admit: 2022-11-11 | Discharge: 2022-11-11 | Disposition: A | Discharge status: Home / Self Care | Payer: Medicare Other | Source: Ambulatory Visit | Attending: Internal Medicine | Admitting: Internal Medicine

## 2022-11-11 DIAGNOSIS — Z1231 Encounter for screening mammogram for malignant neoplasm of breast: Secondary | ICD-10-CM | POA: Insufficient documentation

## 2022-12-03 ENCOUNTER — Ambulatory Visit (HOSPITAL_COMMUNITY)
Admission: RE | Admit: 2022-12-03 | Discharge: 2022-12-03 | Disposition: A | Discharge status: Home / Self Care | Payer: Medicare Other | Source: Ambulatory Visit | Attending: Urology | Admitting: Urology

## 2022-12-03 DIAGNOSIS — N200 Calculus of kidney: Secondary | ICD-10-CM | POA: Insufficient documentation

## 2022-12-11 ENCOUNTER — Ambulatory Visit: Payer: Medicare Other | Admitting: Urology

## 2022-12-11 VITALS — BP 134/55 | HR 71

## 2022-12-11 DIAGNOSIS — N201 Calculus of ureter: Secondary | ICD-10-CM | POA: Diagnosis not present

## 2022-12-11 DIAGNOSIS — N200 Calculus of kidney: Secondary | ICD-10-CM

## 2022-12-11 NOTE — Progress Notes (Signed)
12/11/2022 1:58 PM   Julie Owen 1945-08-11 [id number]  Referring provider: Garlan Fillers, MD [address] [place],  [place] [address]  Followup nephrolithiasis   HPI: Ms Hands is a 77yo here for followup for nephrolithiasis. Renal US shows no calculi. She is about to start methotrexate. She denies any flank pain. No worsening LUTS. She continues to have urinary urgency and occasional urge incontinence. She is mildly bothered by her urinary urgency. She uses 0-1 pads per day.    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   Depression    Diabetes mellitus without complication (HCC)    Fatty liver    History of kidney stones    Hyperlipidemia    Hypertension    Neuromuscular disorder (HCC)    neuropathy   Obesity    Osteoarthritis of lumbar spine    Osteoarthritis of thumb    bilateral   Pneumonia    Prediabetes    Renal disorder    kidney stones   Sleep apnea    uses CPAP    Surgical History: Past Surgical History:  Procedure Laterality Date   BREAST SURGERY  2008   Right breast   BUNIONECTOMY  1993   Right foot   CARPAL TUNNEL RELEASE Right    also had cyst removed   CATARACT EXTRACTION Bilateral    Lens implanted   CYSTOSCOPY W/ URETERAL STENT PLACEMENT Left 05/31/2015   Procedure: CYSTOSCOPY WITH STENT EXCHANGE;  Surgeon: Malen Gauze, MD;  Location: [facility];  Service: Urology;  Laterality: Left;   CYSTOSCOPY WITH RETROGRADE PYELOGRAM, URETEROSCOPY AND STENT PLACEMENT Left 05/19/2015   Procedure: CYSTOSCOPY WITH RETROGRADE PYELOGRAM, URETEROSCOPY AND STENT PLACEMENT;  Surgeon: Malen Gauze, MD;  Location: [facility];  Service: Urology;  Laterality: Left;   CYSTOSCOPY WITH RETROGRADE PYELOGRAM, URETEROSCOPY AND STENT PLACEMENT Left 02/14/2018   Procedure: CYSTOSCOPY WITH RETROGRADE PYELOGRAM, diagnostic URETEROSCOPY;  Surgeon: Sebastian Ache, MD;  Location: [facility];  Service: Urology;  Laterality: Left;   CYSTOSCOPY/RETROGRADE/URETEROSCOPY/STONE EXTRACTION WITH BASKET Left 05/31/2015   Procedure: CYSTOSCOPY/RETROGRADE/STONE EXTRACTION WITH BASKET;  Surgeon: Malen Gauze, MD;  Location: [facility];  Service: Urology;  Laterality: Left;   eyelid surgery Bilateral    Blephroplasty   REDUCTION MAMMAPLASTY Bilateral 2003   STONE EXTRACTION WITH BASKET Left 05/19/2015   Procedure: STONE EXTRACTION WITH BASKET;  Surgeon: Malen Gauze, MD;  Location: [facility];  Service: Urology;  Laterality: Left;   TONSILLECTOMY     TOTAL HIP ARTHROPLASTY  08/2010   Right hip   TOTAL HIP ARTHROPLASTY Left 05/15/2022   Procedure: TOTAL HIP ARTHROPLASTY ANTERIOR APPROACH;  Surgeon: Ollen Gross, MD;  Location: [facility];  Service: Orthopedics;  Laterality: Left;    Home Medications:  Allergies as of 12/11/2022       Reactions   Clavulanic Acid Shortness Of Breath   Tolerates Amoxicillin for dental procedures   Diclofenac Shortness Of Breath   Aspirin Other (See Comments)   Abdominal pain   Benzocaine    Caffeine    Panic attacks   Codeine Swelling   Duloxetine Hcl    Other reaction(s): Ineffective for depression   Lidocaine-epinephrine Other (See Comments)   Oxycodone-acetaminophen Swelling   Percocet [oxycodone-acetaminophen] Swelling   Pravastatin    myalgia   Procaine Hcl    Pseudoephedrine Hcl  Er Other (See Comments)   Jittery, nervousness   Simvastatin    REACTION: hair loss   Venlafaxine    Other reaction(s): Ineffective for depression        Medication List        Accurate as of December 11, 2022  1:58 PM. If you have any questions, ask your nurse or doctor.          calcium-vitamin D 500-200 MG-UNIT tablet Commonly known as: OSCAL WITH D Take 1 tablet by mouth daily.   cycloSPORINE 0.05 % ophthalmic emulsion Commonly known as: RESTASIS Place 1 drop into both eyes 2 (two) times daily.   ezetimibe 10 MG  tablet Commonly known as: ZETIA Take 10 mg by mouth daily.   gabapentin 300 MG capsule Commonly known as: NEURONTIN Take 300 mg by mouth 2 (two) times daily.   HYDROmorphone 2 MG tablet Commonly known as: DILAUDID Take 1-2 tablets (2-4 mg total) by mouth every 6 (six) hours as needed for moderate pain or severe pain.   losartan 50 MG tablet Commonly known as: COZAAR TAKE 1 TABLET BY MOUTH TWICE  DAILY   metFORMIN 500 MG 24 hr tablet Commonly known as: GLUCOPHAGE-XR Take 500 mg by mouth at bedtime.   methocarbamol 500 MG tablet Commonly known as: ROBAXIN Take 1 tablet (500 mg total) by mouth every 6 (six) hours as needed for muscle spasms.   metroNIDAZOLE 0.75 % gel Commonly known as: METROGEL Apply 1 application topically 2 (two) times daily.   NON FORMULARY Pt uses a cpap nightly   rosuvastatin 10 MG tablet Commonly known as: CRESTOR Take 10 mg by mouth every Monday, Wednesday, and Friday.   SYSTANE OP Place 1 drop into both eyes as needed (dry eyes).   VITAMIN C PO Take 1 tablet by mouth daily.        Allergies:  Allergies  Allergen Reactions   Clavulanic Acid Shortness Of Breath    Tolerates Amoxicillin for dental procedures   Diclofenac Shortness Of Breath   Aspirin Other (See Comments)    Abdominal pain   Benzocaine    Caffeine     Panic attacks   Codeine Swelling   Duloxetine Hcl     Other reaction(s): Ineffective for depression   Lidocaine-Epinephrine Other (See Comments)   Oxycodone-Acetaminophen Swelling   Percocet [Oxycodone-Acetaminophen] Swelling   Pravastatin     myalgia   Procaine Hcl    Pseudoephedrine Hcl Er Other (See Comments)    Jittery, nervousness   Simvastatin     REACTION: hair loss   Venlafaxine     Other reaction(s): Ineffective for depression    Family History: Family History  Problem Relation Age of Onset   Hyperlipidemia Mother    Hypertension Mother    Heart disease Mother    Macular degeneration Father     Cancer Sister        colon   Hypertension Sister    Breast cancer Sister    Hypertension Other    Kidney disease Other    Stroke Other    Heart attack Other    Cancer Other    Obesity Other     Social History:  reports that she quit smoking about 47 years ago. Her smoking use included cigarettes. She started smoking about 57 years ago. She has a 5 pack-year smoking history. She has never used smokeless tobacco. She reports that she does not drink alcohol and does not use drugs.  ROS: All other review of  systems were reviewed and are negative except what is noted above in HPI  Physical Exam: BP (!) 134/55   Pulse 71   Constitutional:  Alert and oriented, No acute distress. HEENT: [place] AT, moist mucus membranes.  Trachea midline, no masses. Cardiovascular: No clubbing, cyanosis, or edema. Respiratory: Normal respiratory effort, no increased work of breathing. GI: Abdomen is soft, nontender, nondistended, no abdominal masses GU: No CVA tenderness.  Lymph: No cervical or inguinal lymphadenopathy. Skin: No rashes, bruises or suspicious lesions. Neurologic: Grossly intact, no focal deficits, moving all 4 extremities. Psychiatric: Normal mood and affect.  Laboratory Data: Lab Results  Component Value Date   WBC 10.8 (H) 05/17/2022   HGB 11.2 (L) 05/17/2022   HCT 34.7 (L) 05/17/2022   MCV 96.4 05/17/2022   PLT 180 05/17/2022    Lab Results  Component Value Date   CREATININE 0.70 05/16/2022    No results found for: "PSA"  No results found for: "TESTOSTERONE"  Lab Results  Component Value Date   HGBA1C 6.9 (H) 05/08/2022    Urinalysis    Component Value Date/Time   COLORURINE YELLOW 05/18/2015 1050   APPEARANCEUR Cloudy (A) 04/24/2022 1103   LABSPEC 1.014 05/18/2015 1050   PHURINE 6.5 05/18/2015 1050   GLUCOSEU Negative 04/24/2022 1103   HGBUR LARGE (A) 05/18/2015 1050   BILIRUBINUR Negative 04/24/2022 1103   KETONESUR 15 (A) 05/18/2015 1050   PROTEINUR Trace  04/24/2022 1103   PROTEINUR NEGATIVE 05/18/2015 1050   UROBILINOGEN 0.2 04/09/2012 1800   NITRITE Positive (A) 04/24/2022 1103   NITRITE NEGATIVE 05/18/2015 1050   LEUKOCYTESUR 2+ (A) 04/24/2022 1103    Lab Results  Component Value Date   LABMICR See below: 04/24/2022   WBCUA >30 (A) 04/24/2022   LABEPIT 0-10 04/24/2022   BACTERIA Moderate (A) 04/24/2022    Pertinent Imaging: Renal US 12/04/2022: Images reviewed and discussed with the patient  Results for orders placed during the hospital encounter of 05/13/15  DG Abd 1 View  Narrative CLINICAL DATA:  Left flank pain  EXAM: ABDOMEN - 1 VIEW  COMPARISON:  04/13/2012  FINDINGS: There are no disproportionally dilated loops of bowel. There is no obvious free intraperitoneal gas. Right total hip arthroplasty is stable.  IMPRESSION: Nonobstructive bowel gas pattern.   Electronically Signed By: Jolaine Click M.D. On: 05/13/2015 14:43  No results found for this or any previous visit.  No results found for this or any previous visit.  No results found for this or any previous visit.  Results for orders placed during the hospital encounter of 12/03/22  Ultrasound renal complete  Narrative CLINICAL DATA:  Initial evaluation for nephrolithiasis.  EXAM: RENAL / URINARY TRACT ULTRASOUND COMPLETE  COMPARISON:  Prior renal ultrasound from 11/29/2021.  FINDINGS: Right Kidney:  Renal measurements: 10.2 x 4.6 x 5.5 cm = volume: 134.8 mL. Renal echogenicity within normal limits. No visible nephrolithiasis. No hydronephrosis. No focal renal mass.  Left Kidney:  Renal measurements: 10.2 x 5.3 x 5.9 cm = volume: 167.2 mL. Renal echogenicity within normal limits. No visible nephrolithiasis. No hydronephrosis. No focal renal mass.  Bladder:  Appears normal for degree of bladder distention. Bilateral jets are seen.  Other:  None.  IMPRESSION: Normal renal ultrasound. No nephrolithiasis or  hydronephrosis.   Electronically Signed By: Rise Mu M.D. On: 12/04/2022 20:35  No valid procedures specified. No results found for this or any previous visit.  Results for orders placed during the hospital encounter of 05/11/15  CT RENAL  STONE STUDY  Narrative CLINICAL DATA:  77 year old female with left-sided flank pain.  EXAM: CT ABDOMEN AND PELVIS WITHOUT CONTRAST  TECHNIQUE: Multidetector CT imaging of the abdomen and pelvis was performed following the standard protocol without IV contrast.  COMPARISON:  CT dated 04/09/2012 and lumbar spine MRI dated 09/05/2011  FINDINGS: Evaluation of this exam is limited in the absence of intravenous contrast. Minimal bibasilar linear atelectasis/scarring. The visualized lung bases are otherwise clear. No intra-abdominal free air or free fluid.  Diffuse hepatic steatosis. An 8 mm ill-defined right hepatic hypodense lesion (series 2, image 34) is not well characterized on this noncontrast study but likely represents a cyst or hemangioma. CT with contrast or MRI is recommended for further evaluation. The gallbladder, pancreas, spleen, adrenal glands, right kidney and right ureter, and urinary bladder appear unremarkable. There is a 4 mm left ureteropelvic junction stone with mild left hydronephrosis. Hysterectomy.  There is sigmoid diverticulosis without active inflammatory changes. There is a 1.4 cm low attenuatin/fatty lesion in the distal small bowel in the right lower quadrant (series 2, image 60) which may represent a small lipoma. There is no evidence of bowel obstruction. Normal appendix.  There is aortoiliac atherosclerotic disease. No portal venous gas identified. There is no adenopathy.  Small fat containing umbilical hernia. The abdominal wall soft tissues appear unremarkable. There is a right hip arthroplasty. There multilevel degenerative changes of the spine. No acute fractures.  IMPRESSION: A 4  mm left UPJ stone with mild left hydronephrosis.  Fatty liver. A subcentimeter right hepatic hypodense lesion, likely a hemangioma or cyst. MRI may provide better characterization.   Electronically Signed By: Elgie Collard M.D. On: 05/11/2015 23:45   Assessment & Plan:    1. Kidney stone Followup 6 months with a renal US - Urinalysis, Routine w reflex microscopic   No follow-ups on file.  Wilkie Aye, MD  [facility]

## 2022-12-13 ENCOUNTER — Ambulatory Visit: Payer: Medicare Other | Admitting: Urology

## 2022-12-26 DIAGNOSIS — Q211 Atrial septal defect, unspecified: Secondary | ICD-10-CM | POA: Insufficient documentation

## 2022-12-26 DIAGNOSIS — E785 Hyperlipidemia, unspecified: Secondary | ICD-10-CM | POA: Insufficient documentation

## 2022-12-26 NOTE — Progress Notes (Signed)
Cardiology Office Note:   Date:  12/27/2022  ID:  Julie Owen, DOB [birth year], MRN [id number] PCP: Garlan Fillers, MD  [facility] Providers Cardiologist:  Rollene Rotunda, MD {  History of Present Illness:   Julie Owen is a 77 y.o. female who presents for evaluation of an ASD.  This was found at [facility] in the past on TEE.  She was there was a suggestion about a closure but the patient refused a cardiac MRI because of a dye allergy.  She had a transthoracic echocardiogram in 2018 with a well-preserved ejection fraction.  She is there was a possible PFO or small ASD with very small left to right shunting.    She has had hip surgery since I saw her.  She said her fibromyalgia got a lot worse after this.  She has had sciatica.  She has had just diffuse aches and pains and has not been able to walk as much.  She has had swelling in her hands and feet.  She denies any chest pressure, neck or arm discomfort.  She has had some fluttering in her chest.  She is gained about 10 pounds in a year and a half.  The only new medication is Neurontin.     ROS: As stated in the HPI and negative for all other systems.  Studies Reviewed:    EKG:   EKG Interpretation Date/Time:  Friday December 27 2022 08:00:26 EDT Ventricular Rate:  56 PR Interval:  186 QRS Duration:  78 QT Interval:  416 QTC Calculation: 401 R Axis:   22  Text Interpretation: Sinus bradycardia Low voltage QRS No previous ECGs available Confirmed by Rollene Rotunda ([id number]) on 12/27/2022 8:19:27 AM    Risk Assessment/Calculations:              Physical Exam:   VS:  BP 124/66 (BP Location: Left Arm, Patient Position: Sitting, Cuff Size: Large)   Pulse (!) 56   Ht 5\' 5"  (1.651 m)   Wt 187 lb 12.8 oz (85.2 kg)   SpO2 98%   BMI 31.25 kg/m    Wt Readings from Last 3 Encounters:  12/27/22 187 lb 12.8 oz (85.2 kg)  05/15/22 196 lb (88.9 kg)  05/08/22 196 lb (88.9 kg)     GEN: Well nourished, well developed in no  acute distress NECK: No JVD; No carotid bruits CARDIAC: RRR, murmurs, rubs, gallops RESPIRATORY:  Clear to auscultation without rales, wheezing or rhonchi  ABDOMEN: Soft, non-tender, non-distended EXTREMITIES:  No edema; No deformity   ASSESSMENT AND PLAN:   Atrial septal defect/low secundum ASD:   She does have some diffuse swelling.  I do not think she has any elevated pulmonary pressures on exam but I will check an echocardiogram.  I will give her Lasix 20 mg just for 3 days to see if this helps.   Essential HTN: Her blood pressure is controlled.  No change in therapy.  Hyperlipidemia:    Her LDL not at target but she has been intolerant of statins.  We have previously talked about diet.   Sleep apnea: She uses CPAP.      Palpitations:   These are optically problematic.  She feels months well.  No change in therapy.  Swelling: Is possible it could be related to her gabapentin which happens around 8% of the time.  Should be seeing her rheumatologist might talk about other therapies for her pain and neuropathy.       Follow  up me in one year.  Signed, Rollene Rotunda, MD

## 2022-12-27 ENCOUNTER — Encounter: Payer: Self-pay | Admitting: Cardiology

## 2022-12-27 ENCOUNTER — Ambulatory Visit: Payer: Medicare Other | Attending: Cardiology | Admitting: Cardiology

## 2022-12-27 VITALS — BP 124/66 | HR 56 | Ht 65.0 in | Wt 187.8 lb

## 2022-12-27 DIAGNOSIS — G4730 Sleep apnea, unspecified: Secondary | ICD-10-CM | POA: Diagnosis not present

## 2022-12-27 DIAGNOSIS — E785 Hyperlipidemia, unspecified: Secondary | ICD-10-CM | POA: Diagnosis not present

## 2022-12-27 DIAGNOSIS — R002 Palpitations: Secondary | ICD-10-CM

## 2022-12-27 DIAGNOSIS — Q211 Atrial septal defect, unspecified: Secondary | ICD-10-CM

## 2022-12-27 MED ORDER — FUROSEMIDE 20 MG PO TABS: 20.0000 mg | ORAL_TABLET | Freq: Every day | ORAL | 0 refills | Status: DC

## 2022-12-27 NOTE — Patient Instructions (Signed)
Medication Instructions:  Your physician has recommended you make the following change in your medication:   -Take furosemide (lasix) 20mg  once daily for 3 days.   *If you need a refill on your cardiac medications before your next appointment, please call your pharmacy*   Testing/Procedures: Your physician has requested that you have an echocardiogram. Echocardiography is a painless test that uses sound waves to create images of your heart. It provides your doctor with information about the size and shape of your heart and how well your heart's chambers and valves are working. This procedure takes approximately one hour. There are no restrictions for this procedure. Please do NOT wear cologne, perfume, aftershave, or lotions (deodorant is allowed). Please arrive 15 minutes prior to your appointment time. This will take place at [address] [place]    Follow-Up: At [facility], you and your health needs are our priority.  As part of our continuing mission to provide you with exceptional heart care, we have created designated Provider Care Teams.  These Care Teams include your primary Cardiologist (physician) and Advanced Practice Providers (APPs -  Physician Assistants and Nurse Practitioners) who all work together to provide you with the care you need, when you need it.  We recommend signing up for the patient portal called "MyChart".  Sign up information is provided on this After Visit Summary.  MyChart is used to connect with patients for Virtual Visits (Telemedicine).  Patients are able to view lab/test results, encounter notes, upcoming appointments, etc.  Non-urgent messages can be sent to your provider as well.   To learn more about what you can do with MyChart, go to [email or web address].    Your next appointment:   12 month(s)  Provider:   Rollene Rotunda, MD

## 2023-01-02 ENCOUNTER — Encounter: Payer: Self-pay | Admitting: Urology

## 2023-01-02 NOTE — Patient Instructions (Signed)

## 2023-01-09 ENCOUNTER — Ambulatory Visit: Payer: Medicare Other | Admitting: Family Medicine

## 2023-01-30 ENCOUNTER — Ambulatory Visit (HOSPITAL_COMMUNITY)
Admission: RE | Admit: 2023-01-30 | Discharge: 2023-01-30 | Disposition: A | Discharge status: Home / Self Care | Payer: Medicare Other | Source: Ambulatory Visit | Attending: Cardiology | Admitting: Cardiology

## 2023-01-30 DIAGNOSIS — G4730 Sleep apnea, unspecified: Secondary | ICD-10-CM | POA: Diagnosis present

## 2023-01-30 DIAGNOSIS — Q211 Atrial septal defect, unspecified: Secondary | ICD-10-CM | POA: Insufficient documentation

## 2023-01-30 DIAGNOSIS — R002 Palpitations: Secondary | ICD-10-CM | POA: Diagnosis present

## 2023-01-30 DIAGNOSIS — E785 Hyperlipidemia, unspecified: Secondary | ICD-10-CM | POA: Diagnosis present

## 2023-01-30 LAB — ECHOCARDIOGRAM COMPLETE
Area-P 1/2: 3.11 cm2
Calc EF: 60.6 %
MV VTI: 2.63 cm2
S' Lateral: 3.5 cm
Single Plane A2C EF: 63.5 %
Single Plane A4C EF: 61.6 %

## 2023-01-30 NOTE — Progress Notes (Signed)
  Echocardiogram 2D Echocardiogram has been performed.  Milda Smart 01/30/2023, 11:06 AM

## 2023-05-21 ENCOUNTER — Other Ambulatory Visit: Payer: Self-pay | Admitting: Cardiology

## 2023-06-11 ENCOUNTER — Ambulatory Visit (HOSPITAL_COMMUNITY): Payer: Medicare Other

## 2023-06-18 ENCOUNTER — Ambulatory Visit: Payer: Medicare Other | Admitting: Urology

## 2023-06-19 ENCOUNTER — Ambulatory Visit (HOSPITAL_COMMUNITY)
Admission: RE | Admit: 2023-06-19 | Discharge: 2023-06-19 | Disposition: A | Discharge status: Home / Self Care | Payer: Medicare Other | Source: Ambulatory Visit | Attending: Urology | Admitting: Urology

## 2023-06-19 DIAGNOSIS — N200 Calculus of kidney: Secondary | ICD-10-CM | POA: Diagnosis present

## 2023-06-20 ENCOUNTER — Encounter: Payer: Self-pay | Admitting: Physical Therapy

## 2023-06-20 ENCOUNTER — Other Ambulatory Visit: Payer: Self-pay

## 2023-06-20 ENCOUNTER — Ambulatory Visit: Payer: Medicare Other | Attending: Physical Medicine and Rehabilitation | Admitting: Physical Therapy

## 2023-06-20 DIAGNOSIS — R293 Abnormal posture: Secondary | ICD-10-CM | POA: Diagnosis present

## 2023-06-20 DIAGNOSIS — M5459 Other low back pain: Secondary | ICD-10-CM | POA: Diagnosis present

## 2023-06-20 NOTE — Therapy (Signed)
 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   Depression    Diabetes mellitus without complication (HCC)    Fatty liver    History of kidney stones    Hyperlipidemia    Hypertension    Neuromuscular disorder (HCC)    neuropathy   Obesity    Osteoarthritis of lumbar spine    Osteoarthritis of thumb    bilateral   Pneumonia    Prediabetes    Renal disorder    kidney stones   Sleep apnea    uses CPAP   Past Surgical History:  Procedure Laterality Date   BREAST SURGERY  2008   Right breast   BUNIONECTOMY  1993   Right foot   CARPAL TUNNEL RELEASE Right    also had cyst removed   CATARACT EXTRACTION Bilateral    Lens implanted   CYSTOSCOPY W/ URETERAL STENT PLACEMENT Left 05/31/2015   Procedure: CYSTOSCOPY WITH STENT EXCHANGE;  Surgeon: Belvie LITTIE Clara, MD;  Location: [facility];  Service: Urology;  Laterality: Left;   CYSTOSCOPY WITH RETROGRADE PYELOGRAM, URETEROSCOPY AND STENT PLACEMENT Left 05/19/2015   Procedure: CYSTOSCOPY WITH RETROGRADE PYELOGRAM, URETEROSCOPY AND STENT PLACEMENT;  Surgeon: Belvie LITTIE Clara, MD;  Location: [facility];  Service: Urology;  Laterality: Left;   CYSTOSCOPY WITH RETROGRADE PYELOGRAM, URETEROSCOPY AND STENT PLACEMENT Left 02/14/2018   Procedure: CYSTOSCOPY WITH RETROGRADE PYELOGRAM, diagnostic URETEROSCOPY;  Surgeon: Alvaro Hummer, MD;   Location: [facility];  Service: Urology;  Laterality: Left;   CYSTOSCOPY/RETROGRADE/URETEROSCOPY/STONE EXTRACTION WITH BASKET Left 05/31/2015   Procedure: CYSTOSCOPY/RETROGRADE/STONE EXTRACTION WITH BASKET;  Surgeon: Belvie LITTIE Clara, MD;  Location: [facility];  Service: Urology;  Laterality: Left;   eyelid surgery Bilateral    Blephroplasty   REDUCTION MAMMAPLASTY Bilateral 2003   STONE EXTRACTION WITH BASKET Left 05/19/2015   Procedure: STONE EXTRACTION WITH BASKET;  Surgeon: Belvie LITTIE Clara, MD;  Location: [facility];  Service: Urology;  Laterality: Left;   TONSILLECTOMY     TOTAL HIP ARTHROPLASTY  08/2010   Right hip   TOTAL HIP ARTHROPLASTY Left 05/15/2022   Procedure: TOTAL HIP ARTHROPLASTY ANTERIOR APPROACH;  Surgeon: Melodi Lerner, MD;  Location: [facility];  Service: Orthopedics;  Laterality: Left;   Patient Active Problem List   Diagnosis Date Noted   ASD (atrial septal defect) 12/26/2022   Dyslipidemia 12/26/2022   OA (osteoarthritis) of hip 05/15/2022   Osteoarthritis of left hip 05/15/2022   Palpitations 11/19/2021   Dependence on CPAP ventilation 08/21/2021   PMR (polymyalgia rheumatica) (HCC) 08/21/2021   PFO (patent foramen ovale) 10/25/2020   Kidney stone 12/06/2019   Sicca (HCC) 08/15/2016   OSA on CPAP 06/10/2014   Compliance with medication regimen 06/10/2014   Snorings 11/04/2013  Nocturia 11/04/2013   Insomnia 11/04/2013   Diabetes mellitus (HCC) 01/10/2012   Essential hypertension, benign 04/23/2010   ATRIAL SEPTAL DEFECT, HX OF 04/23/2010    REFERRING PROVIDER: Charlie Dolores MD  REFERRING DIAG: Lumbar Radiculopathy.  Rationale for Evaluation and Treatment: Rehabilitation  THERAPY DIAG:  Other low back pain  ONSET DATE: January 2024.  SUBJECTIVE:                                                                                                                                                                                           SUBJECTIVE STATEMENT: The  patient presents to the clinic with c/o low back pain since January of 2024 shortly after a right THA.  Today, at rest she has no pain but increased bending, twisting, standing and walking can create very high pain-levels.  Her pain will radiate into both hips.  Her pain is described as an ache and sharp.  She sometimes feels spasms.    PERTINENT HISTORY:  Bilateral total hip replacements.  PAIN:  Are you having pain? No  PRECAUTIONS: Other: Bilateral THA's.    RED FLAGS: None   WEIGHT BEARING RESTRICTIONS: No  FALLS:  Has patient fallen in last 6 months? No  LIVING ENVIRONMENT: Lives with: lives with their spouse Lives in: House/apartment Has following equipment at home: None  OCCUPATION: Retired Runner, Broadcasting/film/video.  PLOF: Independent  PATIENT GOALS: Bend and pick-up stuff off floor.  Put on socks.     OBJECTIVE:  Note: Objective measures were completed at Evaluation unless otherwise noted.  PATIENT SURVEYS:  Modified Oswestry 34.   POSTURE: rounded shoulders, forward head, decreased lumbar lordosis, posterior pelvic tilt, and flexed trunk   PALPATION: Tender to palpation over bilateral SIJ's.    LUMBAR ROM:   Active lumbar flexion a 50% deficit and extension is 20 degrees.   LOWER EXTREMITY MMT:    Bilateral knee and ankle strength is 5/5.  Right hip flexion limited to 3-/5 which appears due to pain.    LUMBAR SPECIAL TESTS:  Equal leg lengths.  Pain with a left SLR test.     GAIT: Slow and purposeful gait in some trunk flexion.    TREATMENT DATE: HMP and IFC at 80-150 Hz on 40% scan x 20 minutes to patient's bilateral LB.  Normal modality response following removal of modality.  PATIENT EDUCATION:  Education details:  Person educated:  International aid/development worker:  Education comprehension:   HOME EXERCISE PROGRAM:   ASSESSMENT:  CLINICAL  IMPRESSION: The patient presents to [facility] with chronic low back pain with radiation into bilateral hips.  She is tender to palpation over bilateral SIJ's. She has pain reproduction with a right SLR test.  She has difficulty elevating her right LE against gravity due to pain.  She has limitations of active lumbar flexion and extension.  Her Modified Oswestry score is 34.   Her Patient will benefit from skilled physical therapy intervention to address pain and deficits.  OBJECTIVE IMPAIRMENTS: Abnormal gait, decreased activity tolerance, decreased ROM, decreased strength, and pain.   ACTIVITY LIMITATIONS: carrying, lifting, bending, standing, and locomotion level  PARTICIPATION LIMITATIONS: meal prep, cleaning, laundry, shopping, and community activity  PERSONAL FACTORS: Time since onset of injury/illness/exacerbation and 1 comorbidity: bilateral total hip replacements  are also affecting patient's functional outcome.   REHAB POTENTIAL: Good  CLINICAL DECISION MAKING: Evolving/moderate complexity  EVALUATION COMPLEXITY: Moderate   GOALS:  LONG TERM GOALS: Target date: 08/01/23.  Ind with a HEP.  Goal status: INITIAL  2.  Walk a community distance with pain not > 3/10.  Goal status: INITIAL  3.  Eliminate pain radiation into bilateral hips.  Goal status: INITIAL  4.  Perform ADL's with pain not > 3/10.  Goal status: INITIAL  PLAN:  PT FREQUENCY: 2x/week  PT DURATION: 6 weeks  PLANNED INTERVENTIONS: 97110-Therapeutic exercises, 97530- Therapeutic activity, [id number]- Neuromuscular re-education, 97535- Self Care, [id number]- Manual therapy, 97014- Electrical stimulation (unattended), 97035- Ultrasound, Patient/Family education, Cryotherapy, and Moist heat.  PLAN FOR NEXT SESSION: Combo e'stim/US  at 1.50 W/CM2, STW/M, Core exercise progression, spinal protection techniques and body mechanics training.   Dorma Altman, PT 06/20/2023, 1:40 PM

## 2023-06-25 ENCOUNTER — Ambulatory Visit: Payer: Medicare Other

## 2023-06-25 DIAGNOSIS — M5459 Other low back pain: Secondary | ICD-10-CM

## 2023-06-25 DIAGNOSIS — R293 Abnormal posture: Secondary | ICD-10-CM

## 2023-06-25 NOTE — Therapy (Signed)
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   Depression    Diabetes mellitus without complication (HCC)    Fatty liver    History of kidney stones    Hyperlipidemia    Hypertension    Neuromuscular disorder (HCC)    neuropathy   Obesity    Osteoarthritis of lumbar spine    Osteoarthritis of thumb    bilateral   Pneumonia    Prediabetes    Renal disorder    kidney stones   Sleep apnea    uses CPAP   Past Surgical History:  Procedure Laterality Date   BREAST SURGERY  2008   Right breast   BUNIONECTOMY  1993   Right foot   CARPAL TUNNEL RELEASE Right    also had cyst removed   CATARACT EXTRACTION Bilateral    Lens implanted   CYSTOSCOPY W/ URETERAL STENT PLACEMENT Left 05/31/2015   Procedure: CYSTOSCOPY WITH STENT EXCHANGE;  Surgeon: Malen Gauze, MD;  Location: [facility];  Service: Urology;  Laterality: Left;   CYSTOSCOPY WITH RETROGRADE PYELOGRAM, URETEROSCOPY AND STENT PLACEMENT Left 05/19/2015   Procedure: CYSTOSCOPY WITH RETROGRADE PYELOGRAM, URETEROSCOPY AND STENT PLACEMENT;  Surgeon: Malen Gauze, MD;  Location: [facility];  Service: Urology;  Laterality: Left;   CYSTOSCOPY WITH RETROGRADE PYELOGRAM, URETEROSCOPY AND STENT PLACEMENT Left 02/14/2018   Procedure: CYSTOSCOPY WITH RETROGRADE PYELOGRAM, diagnostic URETEROSCOPY;  Surgeon: Sebastian Ache, MD;  [facility];  Service: Urology;  Laterality: Left;    CYSTOSCOPY/RETROGRADE/URETEROSCOPY/STONE EXTRACTION WITH BASKET Left 05/31/2015   Procedure: CYSTOSCOPY/RETROGRADE/STONE EXTRACTION WITH BASKET;  Surgeon: Malen Gauze, MD;  Location: [facility];  Service: Urology;  Laterality: Left;   eyelid surgery Bilateral    Blephroplasty   REDUCTION MAMMAPLASTY Bilateral 2003   STONE EXTRACTION WITH BASKET Left 05/19/2015   Procedure: STONE EXTRACTION WITH BASKET;  Surgeon: Malen Gauze, MD;  Location: [facility];  Service: Urology;  Laterality: Left;   TONSILLECTOMY     TOTAL HIP ARTHROPLASTY  08/2010   Right hip   TOTAL HIP ARTHROPLASTY Left 05/15/2022   Procedure: TOTAL HIP ARTHROPLASTY ANTERIOR APPROACH;  Surgeon: Ollen Gross, MD;  Location: [facility];  Service: Orthopedics;  Laterality: Left;   Patient Active Problem List   Diagnosis Date Noted   ASD (atrial septal defect) 12/26/2022   Dyslipidemia 12/26/2022   OA (osteoarthritis) of hip 05/15/2022   Osteoarthritis of left hip 05/15/2022   Palpitations 11/19/2021   Dependence on CPAP ventilation 08/21/2021   PMR (polymyalgia rheumatica) (HCC) 08/21/2021   PFO (patent foramen ovale) 10/25/2020   Kidney stone 12/06/2019   Sicca (HCC) 08/15/2016   OSA on CPAP 06/10/2014   Compliance with medication regimen 06/10/2014   Snorings 11/04/2013   Nocturia 11/04/2013   Insomnia 11/04/2013   Diabetes mellitus (HCC) 01/10/2012  Essential hypertension, benign 04/23/2010   ATRIAL SEPTAL DEFECT, HX OF 04/23/2010    REFERRING PROVIDER: Sheran Luz MD  REFERRING DIAG: Lumbar Radiculopathy.  Rationale for Evaluation and Treatment: Rehabilitation  THERAPY DIAG:  Other low back pain  Abnormal posture  ONSET DATE: January 2024.  SUBJECTIVE:                                                                                                                                                                                           SUBJECTIVE STATEMENT: Pt reports 7/10 low back pain.    PERTINENT HISTORY:  Bilateral total hip replacements.  PAIN:  Are you having pain? Yes: NPRS scale: 7/10  Pain location: low back  PRECAUTIONS: Other: Bilateral THA's.    RED FLAGS: None   WEIGHT BEARING RESTRICTIONS: No  FALLS:  Has patient fallen in last 6 months? No  LIVING ENVIRONMENT: Lives with: lives with their spouse Lives in: House/apartment Has following equipment at home: None  OCCUPATION: Retired Runner, broadcasting/film/video.  PLOF: Independent  PATIENT GOALS: Bend and pick-up stuff off floor.  Put on socks.     OBJECTIVE:  Note: Objective measures were completed at Evaluation unless otherwise noted.  PATIENT SURVEYS:  Modified Oswestry 34.   POSTURE: rounded shoulders, forward head, decreased lumbar lordosis, posterior pelvic tilt, and flexed trunk   PALPATION: Tender to palpation over bilateral SIJ's.    LUMBAR ROM:   Active lumbar flexion a 50% deficit and extension is 20 degrees.   LOWER EXTREMITY MMT:    Bilateral knee and ankle strength is 5/5.  Right hip flexion limited to 3-/5 which appears due to pain.    LUMBAR SPECIAL TESTS:  Equal leg lengths.  Pain with a left SLR test.     GAIT: Slow and purposeful gait in some trunk flexion.    TREATMENT DATE:   06/25/23                      EXERCISE LOG  Exercise Repetitions and Resistance Comments  Nustep Lvl 3 x 15 mins   [employer] 4 mins   [employer] out 4 mins   [employer] Stretch 5 reps x 1 min hold LLE      Blank cell = exercise not performed today   Modalities  Date:  Unattended Estim: Lumbar, IFC 80-150 Hz, 15 mins, Pain and Tone Hot Pack: Lumbar, 15 mins, Pain and Tone   06/20/23:   HMP and IFC at 80-150 Hz on 40% scan x 20 minutes to patient's bilateral LB.  Normal modality response following removal of modality.  PATIENT EDUCATION:  Education details:  Person  educated:  International aid/development worker:  Education comprehension:   HOME EXERCISE PROGRAM:   ASSESSMENT:  CLINICAL IMPRESSION: Pt arrives for today's treatment session reporting 7/10 low back pain.  Pt introduced to Nustep to warm up today without any pain or discomfort.  Pt also introduced standing physio-ball exercises to increase ROM and function of low back.  Pt also educated in stretches to perform at home to assist with decreasing back and left hip pain.  Normal responses to estim and MH noted upon removal.  Pt reported decreased pain at completion of today's treatment session.    OBJECTIVE IMPAIRMENTS: Abnormal gait, decreased activity tolerance, decreased ROM, decreased strength, and pain.   ACTIVITY LIMITATIONS: carrying, lifting, bending, standing, and locomotion level  PARTICIPATION LIMITATIONS: meal prep, cleaning, laundry, shopping, and community activity  PERSONAL FACTORS: Time since onset of injury/illness/exacerbation and 1 comorbidity: bilateral total hip replacements  are also affecting patient's functional outcome.   REHAB POTENTIAL: Good  CLINICAL DECISION MAKING: Evolving/moderate complexity  EVALUATION COMPLEXITY: Moderate   GOALS:  LONG TERM GOALS: Target date: 08/01/23.  Ind with a HEP.  Goal status: INITIAL  2.  Walk a community distance with pain not > 3/10.  Goal status: INITIAL  3.  Eliminate pain radiation into bilateral hips.  Goal status: INITIAL  4.  Perform ADL's with pain not > 3/10.  Goal status: INITIAL  PLAN:  PT FREQUENCY: 2x/week  PT DURATION: 6 weeks  PLANNED INTERVENTIONS: 97110-Therapeutic exercises, 97530- Therapeutic activity, [id number]- Neuromuscular re-education, 97535- Self Care, 96045- Manual therapy, 97014- Electrical stimulation (unattended), 97035- Ultrasound, Patient/Family education, Cryotherapy, and Moist heat.  PLAN FOR NEXT SESSION: Combo e'stim/US at 1.50 W/CM2, STW/M, Core exercise progression, spinal protection techniques  and body mechanics training.   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   Depression    Diabetes mellitus without complication (HCC)    Fatty liver    History of kidney stones    Hyperlipidemia    Hypertension    Neuromuscular disorder (HCC)    neuropathy   Obesity    Osteoarthritis of lumbar spine    Osteoarthritis of thumb    bilateral   Pneumonia    Prediabetes    Renal disorder    kidney stones   Sleep apnea    uses CPAP   Past Surgical History:  Procedure Laterality Date   BREAST SURGERY  2008   Right breast   BUNIONECTOMY  1993   Right foot   CARPAL TUNNEL RELEASE Right    also had cyst removed   CATARACT EXTRACTION Bilateral    Lens implanted   CYSTOSCOPY W/ URETERAL STENT PLACEMENT Left 05/31/2015   Procedure: CYSTOSCOPY WITH STENT EXCHANGE;  Surgeon: Mardene Celeste  McKenzie, MD;  Location: [facility];  Service: Urology;  Laterality: Left;   CYSTOSCOPY WITH RETROGRADE PYELOGRAM, URETEROSCOPY AND STENT PLACEMENT Left 05/19/2015   Procedure: CYSTOSCOPY WITH RETROGRADE PYELOGRAM, URETEROSCOPY AND STENT PLACEMENT;  Surgeon: Malen Gauze, MD;  Location: [facility];  Service: Urology;  Laterality: Left;   CYSTOSCOPY WITH RETROGRADE PYELOGRAM, URETEROSCOPY AND STENT PLACEMENT Left 02/14/2018   Procedure: CYSTOSCOPY WITH RETROGRADE PYELOGRAM, diagnostic URETEROSCOPY;  Surgeon:  Sebastian Ache, MD;  Location: [facility];  Service: Urology;  Laterality: Left;   CYSTOSCOPY/RETROGRADE/URETEROSCOPY/STONE EXTRACTION WITH BASKET Left 05/31/2015   Procedure: CYSTOSCOPY/RETROGRADE/STONE EXTRACTION WITH BASKET;  Surgeon: Malen Gauze, MD;  Location: [facility];  Service: Urology;  Laterality: Left;   eyelid surgery Bilateral    Blephroplasty   REDUCTION MAMMAPLASTY Bilateral 2003   STONE EXTRACTION WITH BASKET Left 05/19/2015   Procedure: STONE EXTRACTION WITH BASKET;  Surgeon: Malen Gauze, MD;  Location: [facility];  Service: Urology;  Laterality: Left;   TONSILLECTOMY     TOTAL HIP ARTHROPLASTY  08/2010   Right hip   TOTAL HIP ARTHROPLASTY Left 05/15/2022   Procedure: TOTAL HIP ARTHROPLASTY ANTERIOR APPROACH;  Surgeon: Ollen Gross, MD;  Location: [facility];  Service: Orthopedics;  Laterality: Left;   Patient Active Problem List   Diagnosis Date Noted   ASD (atrial septal defect) 12/26/2022   Dyslipidemia 12/26/2022   OA (osteoarthritis) of hip 05/15/2022   Osteoarthritis of left hip 05/15/2022   Palpitations 11/19/2021   Dependence on CPAP ventilation 08/21/2021   PMR (polymyalgia rheumatica) (HCC) 08/21/2021   PFO (patent foramen ovale) 10/25/2020   Kidney stone 12/06/2019   Sicca (HCC) 08/15/2016   OSA on CPAP 06/10/2014   Compliance with medication regimen 06/10/2014   Snorings 11/04/2013   Nocturia 11/04/2013   Insomnia 11/04/2013   Diabetes mellitus (HCC) 01/10/2012   Essential hypertension, benign 04/23/2010   ATRIAL SEPTAL DEFECT, HX OF 04/23/2010    REFERRING PROVIDER: Sheran Luz MD  REFERRING DIAG: Lumbar Radiculopathy.  Rationale for Evaluation and Treatment: Rehabilitation  THERAPY DIAG:  Other low back pain  Abnormal posture  ONSET DATE: January 2024.  SUBJECTIVE:                                                                                                                                                                                            SUBJECTIVE STATEMENT: The patient presents to the clinic with c/o low back pain since January of 2024 shortly after a right THA.  Today, at rest she has no pain but increased bending, twisting, standing and walking can create very high pain-levels.  Her pain will radiate into  both hips.  Her pain is described as an ache and sharp.  She sometimes feels spasms.    PERTINENT HISTORY:  Bilateral total hip replacements.  PAIN:  Are you having pain? No  PRECAUTIONS: Other: Bilateral THA's.    RED FLAGS: None   WEIGHT BEARING RESTRICTIONS: No  FALLS:  Has patient fallen in last 6 months? No  LIVING ENVIRONMENT: Lives with: lives with their spouse Lives in: House/apartment Has following equipment at home: None  OCCUPATION: Retired Runner, broadcasting/film/video.  PLOF: Independent  PATIENT GOALS: Bend and pick-up stuff off floor.  Put on socks.     OBJECTIVE:  Note: Objective measures were completed at Evaluation unless otherwise noted.  PATIENT SURVEYS:  Modified Oswestry 34.   POSTURE: rounded shoulders, forward head, decreased lumbar lordosis, posterior pelvic tilt, and flexed trunk   PALPATION: Tender to palpation over bilateral SIJ's.    LUMBAR ROM:   Active lumbar flexion a 50% deficit and extension is 20 degrees.   LOWER EXTREMITY MMT:    Bilateral knee and ankle strength is 5/5.  Right hip flexion limited to 3-/5 which appears due to pain.    LUMBAR SPECIAL TESTS:  Equal leg lengths.  Pain with a left SLR test.     GAIT: Slow and purposeful gait in some trunk flexion.    TREATMENT DATE: HMP and IFC at 80-150 Hz on 40% scan x 20 minutes to patient's bilateral LB.  Normal modality response following removal of modality.                                                                                                                                PATIENT EDUCATION:  Education details:  Person educated:  International aid/development worker:  Education comprehension:   HOME EXERCISE  PROGRAM:   ASSESSMENT:  CLINICAL IMPRESSION: The patient presents to [facility] with chronic low back pain with radiation into bilateral hips.  She is tender to palpation over bilateral SIJ's. She has pain reproduction with a right SLR test.  She has difficulty elevating her right LE against gravity due to pain.  She has limitations of active lumbar flexion and extension.  Her Modified Oswestry score is 34.   Her Patient will benefit from skilled physical therapy intervention to address pain and deficits.  OBJECTIVE IMPAIRMENTS: Abnormal gait, decreased activity tolerance, decreased ROM, decreased strength, and pain.   ACTIVITY LIMITATIONS: carrying, lifting, bending, standing, and locomotion level  PARTICIPATION LIMITATIONS: meal prep, cleaning, laundry, shopping, and community activity  PERSONAL FACTORS: Time since onset of injury/illness/exacerbation and 1 comorbidity: bilateral total hip replacements  are also affecting patient's functional outcome.   REHAB POTENTIAL: Good  CLINICAL DECISION MAKING: Evolving/moderate complexity  EVALUATION COMPLEXITY: Moderate   GOALS:  LONG TERM GOALS: Target date: 08/01/23.  Ind with a HEP.  Goal status: INITIAL  2.  Walk a community distance with pain not > 3/10.  Goal status: INITIAL  3.  Eliminate pain radiation into  bilateral hips.  Goal status: INITIAL  4.  Perform ADL's with pain not > 3/10.  Goal status: INITIAL  PLAN:  PT FREQUENCY: 2x/week  PT DURATION: 6 weeks  PLANNED INTERVENTIONS: 97110-Therapeutic exercises, 97530- Therapeutic activity, [id number]- Neuromuscular re-education, 97535- Self Care, 16109- Manual therapy, 97014- Electrical stimulation (unattended), 97035- Ultrasound, Patient/Family education, Cryotherapy, and Moist heat.  PLAN FOR NEXT SESSION: Combo e'stim/US at 1.50 W/CM2, STW/M, Core exercise progression, spinal protection techniques and body mechanics training.   Newman Pies, PTA 06/25/2023, 12:03 PM

## 2023-06-27 ENCOUNTER — Ambulatory Visit: Payer: Medicare Other | Admitting: Physical Therapy

## 2023-06-27 DIAGNOSIS — M5459 Other low back pain: Secondary | ICD-10-CM | POA: Diagnosis not present

## 2023-06-27 DIAGNOSIS — R293 Abnormal posture: Secondary | ICD-10-CM

## 2023-06-27 NOTE — Therapy (Signed)
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   Depression    Diabetes mellitus without complication (HCC)    Fatty liver    History of kidney stones    Hyperlipidemia    Hypertension    Neuromuscular disorder (HCC)    neuropathy   Obesity    Osteoarthritis of lumbar spine    Osteoarthritis of thumb    bilateral   Pneumonia    Prediabetes    Renal disorder    kidney stones   Sleep apnea    uses CPAP   Past Surgical History:  Procedure Laterality Date   BREAST SURGERY  2008   Right breast   BUNIONECTOMY  1993   Right foot   CARPAL TUNNEL RELEASE Right    also had cyst removed   CATARACT EXTRACTION Bilateral    Lens implanted   CYSTOSCOPY W/ URETERAL STENT PLACEMENT Left 05/31/2015   Procedure: CYSTOSCOPY WITH STENT EXCHANGE;  Surgeon: Malen Gauze, MD;  Location: [facility];  Service: Urology;  Laterality: Left;   CYSTOSCOPY WITH RETROGRADE PYELOGRAM, URETEROSCOPY AND STENT PLACEMENT Left 05/19/2015   Procedure: CYSTOSCOPY WITH RETROGRADE PYELOGRAM, URETEROSCOPY AND STENT PLACEMENT;  Surgeon: Malen Gauze, MD;  Location: [facility];  Service: Urology;  Laterality: Left;   CYSTOSCOPY WITH RETROGRADE PYELOGRAM, URETEROSCOPY AND STENT PLACEMENT Left 02/14/2018   Procedure: CYSTOSCOPY WITH RETROGRADE PYELOGRAM, diagnostic URETEROSCOPY;  Surgeon: Sebastian Ache, MD;   Location: [facility];  Service: Urology;  Laterality: Left;   CYSTOSCOPY/RETROGRADE/URETEROSCOPY/STONE EXTRACTION WITH BASKET Left 05/31/2015   Procedure: CYSTOSCOPY/RETROGRADE/STONE EXTRACTION WITH BASKET;  Surgeon: Malen Gauze, MD;  Location: [facility];  Service: Urology;  Laterality: Left;   eyelid surgery Bilateral    Blephroplasty   REDUCTION MAMMAPLASTY Bilateral 2003   STONE EXTRACTION WITH BASKET Left 05/19/2015   Procedure: STONE EXTRACTION WITH BASKET;  Surgeon: Malen Gauze, MD;  Location: [facility];  Service: Urology;  Laterality: Left;   TONSILLECTOMY     TOTAL HIP ARTHROPLASTY  08/2010   Right hip   TOTAL HIP ARTHROPLASTY Left 05/15/2022   Procedure: TOTAL HIP ARTHROPLASTY ANTERIOR APPROACH;  Surgeon: Ollen Gross, MD;  Location: [facility];  Service: Orthopedics;  Laterality: Left;   Patient Active Problem List   Diagnosis Date Noted   ASD (atrial septal defect) 12/26/2022   Dyslipidemia 12/26/2022   OA (osteoarthritis) of hip 05/15/2022   Osteoarthritis of left hip 05/15/2022   Palpitations 11/19/2021   Dependence on CPAP ventilation 08/21/2021   PMR (polymyalgia rheumatica) (HCC) 08/21/2021   PFO (patent foramen ovale) 10/25/2020   Kidney stone 12/06/2019   Sicca (HCC) 08/15/2016   OSA on CPAP 06/10/2014   Compliance with medication regimen 06/10/2014   Snorings 11/04/2013  Nocturia 11/04/2013   Insomnia 11/04/2013   Diabetes mellitus (HCC) 01/10/2012   Essential hypertension, benign 04/23/2010   ATRIAL SEPTAL DEFECT, HX OF 04/23/2010    REFERRING PROVIDER: Sheran Luz MD  REFERRING DIAG: Lumbar Radiculopathy.  Rationale for Evaluation and Treatment: Rehabilitation  THERAPY DIAG:  Other low back pain  Abnormal posture  ONSET DATE: January 2024.  SUBJECTIVE:                                                                                                                                                                                            SUBJECTIVE STATEMENT: Pt reports 8/10 low back pain.   PERTINENT HISTORY:  Bilateral total hip replacements.  PAIN:  Are you having pain? Yes: NPRS scale: 8/10  Pain location: low back  PRECAUTIONS: Other: Bilateral THA's.    RED FLAGS: None   WEIGHT BEARING RESTRICTIONS: No  FALLS:  Has patient fallen in last 6 months? No  LIVING ENVIRONMENT: Lives with: lives with their spouse Lives in: House/apartment Has following equipment at home: None  OCCUPATION: Retired Runner, broadcasting/film/video.  PLOF: Independent  PATIENT GOALS: Bend and pick-up stuff off floor.  Put on socks.     OBJECTIVE:  Note: Objective measures were completed at Evaluation unless otherwise noted.  PATIENT SURVEYS:  Modified Oswestry 34.   POSTURE: rounded shoulders, forward head, decreased lumbar lordosis, posterior pelvic tilt, and flexed trunk   PALPATION: Tender to palpation over bilateral SIJ's.    LUMBAR ROM:   Active lumbar flexion a 50% deficit and extension is 20 degrees.   LOWER EXTREMITY MMT:    Bilateral knee and ankle strength is 5/5.  Right hip flexion limited to 3-/5 which appears due to pain.    LUMBAR SPECIAL TESTS:  Equal leg lengths.  Pain with a left SLR test.     GAIT: Slow and purposeful gait in some trunk flexion.    TREATMENT DATE:   06/27/23:  Nustep level 3 x 15 minutes f/b STW/M to patient's affected L-S region in sdly position with pillows between knees for comfort x 15 minutes f/b HMP and IFC at 80-150 Hz on 40% scan x 20 minutes.   Normal modality response following removal of modality.    06/25/23                      EXERCISE LOG  Exercise Repetitions and Resistance Comments  Nustep Lvl 3 x 15 mins   [employer] 4 mins   [employer] out 4 mins   [employer] Stretch 5 reps x 1 min hold LLE      Blank cell =  exercise not performed today   Modalities  Date:  Unattended Estim: Lumbar, IFC 80-150 Hz, 15 mins, Pain and Tone Hot Pack: Lumbar, 15 mins, Pain and  Tone   06/20/23:   HMP and IFC at 80-150 Hz on 40% scan x 20 minutes to patient's bilateral LB.  Normal modality response following removal of modality.                                                                                                                                PATIENT EDUCATION:  Education details:  Person educated:  International aid/development worker:  Education comprehension:   HOME EXERCISE PROGRAM:   ASSESSMENT:  CLINICAL IMPRESSION: Patient reported increased tenderness especially over her left lower lumbar musculature. She did very well with STW/M and reported feeling better after treatment.     OBJECTIVE IMPAIRMENTS: Abnormal gait, decreased activity tolerance, decreased ROM, decreased strength, and pain.   ACTIVITY LIMITATIONS: carrying, lifting, bending, standing, and locomotion level  PARTICIPATION LIMITATIONS: meal prep, cleaning, laundry, shopping, and community activity  PERSONAL FACTORS: Time since onset of injury/illness/exacerbation and 1 comorbidity: bilateral total hip replacements  are also affecting patient's functional outcome.   REHAB POTENTIAL: Good  CLINICAL DECISION MAKING: Evolving/moderate complexity  EVALUATION COMPLEXITY: Moderate   GOALS:  LONG TERM GOALS: Target date: 08/01/23.  Ind with a HEP.  Goal status: INITIAL  2.  Walk a community distance with pain not > 3/10.  Goal status: INITIAL  3.  Eliminate pain radiation into bilateral hips.  Goal status: INITIAL  4.  Perform ADL's with pain not > 3/10.  Goal status: INITIAL  PLAN:  PT FREQUENCY: 2x/week  PT DURATION: 6 weeks  PLANNED INTERVENTIONS: 97110-Therapeutic exercises, 97530- Therapeutic activity, [id number]- Neuromuscular re-education, 97535- Self Care, [id number]- Manual therapy, 97014- Electrical stimulation (unattended), 97035- Ultrasound, Patient/Family education, Cryotherapy, and Moist heat.  PLAN FOR NEXT SESSION: Combo e'stim/US at 1.50 W/CM2, STW/M, Core exercise  progression, spinal protection techniques and body mechanics training.   Caidyn Blossom, [place], PT 06/27/2023, 12:54 PM  OUTPATIENT PHYSICAL THERAPY THORACOLUMBAR EVALUATION   Patient Name: Julie Owen MRN: [id number] DOB:1945/05/25, 78 y.o., female Today's Date: 06/27/2023  END OF SESSION:  PT End of Session - 06/27/23 1250     Visit Number 3    Number of Visits 12    Date for PT Re-Evaluation 08/01/23    PT Start Time 1018    PT Stop Time 1114    PT Time Calculation (min) 56 min    Activity Tolerance Patient tolerated treatment well    Behavior During Therapy WFL for tasks assessed/performed             Past Medical History:  Diagnosis Date   Allergy    Anxiety    Atrial septal defect    Cancer (HCC)    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   Depression    Diabetes mellitus without complication (HCC)  Fatty liver    History of kidney stones    Hyperlipidemia    Hypertension    Neuromuscular disorder (HCC)    neuropathy   Obesity    Osteoarthritis of lumbar spine    Osteoarthritis of thumb    bilateral   Pneumonia    Prediabetes    Renal disorder    kidney stones   Sleep apnea    uses CPAP   Past Surgical History:  Procedure Laterality Date   BREAST SURGERY  2008   Right breast   BUNIONECTOMY  1993   Right foot   CARPAL TUNNEL RELEASE Right    also had cyst removed   CATARACT EXTRACTION Bilateral    Lens implanted   CYSTOSCOPY W/ URETERAL STENT PLACEMENT Left 05/31/2015   Procedure: CYSTOSCOPY WITH STENT EXCHANGE;  Surgeon: Malen Gauze, MD;  Location: [facility];  Service: Urology;  Laterality: Left;   CYSTOSCOPY WITH RETROGRADE PYELOGRAM, URETEROSCOPY AND STENT PLACEMENT Left 05/19/2015   Procedure: CYSTOSCOPY WITH RETROGRADE PYELOGRAM, URETEROSCOPY AND STENT PLACEMENT;  Surgeon: Malen Gauze, MD;  Location: [facility];  Service: Urology;  Laterality: Left;   CYSTOSCOPY WITH RETROGRADE PYELOGRAM, URETEROSCOPY AND STENT PLACEMENT Left  02/14/2018   Procedure: CYSTOSCOPY WITH RETROGRADE PYELOGRAM, diagnostic URETEROSCOPY;  Surgeon: Sebastian Ache, MD;  Location: [facility];  Service: Urology;  Laterality: Left;   CYSTOSCOPY/RETROGRADE/URETEROSCOPY/STONE EXTRACTION WITH BASKET Left 05/31/2015   Procedure: CYSTOSCOPY/RETROGRADE/STONE EXTRACTION WITH BASKET;  Surgeon: Malen Gauze, MD;  Location: [facility];  Service: Urology;  Laterality: Left;   eyelid surgery Bilateral    Blephroplasty   REDUCTION MAMMAPLASTY Bilateral 2003   STONE EXTRACTION WITH BASKET Left 05/19/2015   Procedure: STONE EXTRACTION WITH BASKET;  Surgeon: Malen Gauze, MD;  Location: [facility];  Service: Urology;  Laterality: Left;   TONSILLECTOMY     TOTAL HIP ARTHROPLASTY  08/2010   Right hip   TOTAL HIP ARTHROPLASTY Left 05/15/2022   Procedure: TOTAL HIP ARTHROPLASTY ANTERIOR APPROACH;  Surgeon: Ollen Gross, MD;  Location: [facility];  Service: Orthopedics;  Laterality: Left;   Patient Active Problem List   Diagnosis Date Noted   ASD (atrial septal defect) 12/26/2022   Dyslipidemia 12/26/2022   OA (osteoarthritis) of hip 05/15/2022   Osteoarthritis of left hip 05/15/2022   Palpitations 11/19/2021   Dependence on CPAP ventilation 08/21/2021   PMR (polymyalgia rheumatica) (HCC) 08/21/2021   PFO (patent foramen ovale) 10/25/2020   Kidney stone 12/06/2019   Sicca (HCC) 08/15/2016   OSA on CPAP 06/10/2014   Compliance with medication regimen 06/10/2014   Snorings 11/04/2013   Nocturia 11/04/2013   Insomnia 11/04/2013   Diabetes mellitus (HCC) 01/10/2012   Essential hypertension, benign 04/23/2010   ATRIAL SEPTAL DEFECT, HX OF 04/23/2010    REFERRING PROVIDER: Sheran Luz MD  REFERRING DIAG: Lumbar Radiculopathy.  Rationale for Evaluation and Treatment: Rehabilitation  THERAPY DIAG:  Other low back pain  Abnormal posture  ONSET DATE: January 2024.  SUBJECTIVE:  SUBJECTIVE STATEMENT: The patient presents to the clinic with c/o low back pain since January of 2024 shortly after a right THA.  Today, at rest she has no pain but increased bending, twisting, standing and walking can create very high pain-levels.  Her pain will radiate into both hips.  Her pain is described as an ache and sharp.  She sometimes feels spasms.    PERTINENT HISTORY:  Bilateral total hip replacements.  PAIN:  Are you having pain? No  PRECAUTIONS: Other: Bilateral THA's.    RED FLAGS: None   WEIGHT BEARING RESTRICTIONS: No  FALLS:  Has patient fallen in last 6 months? No  LIVING ENVIRONMENT: Lives with: lives with their spouse Lives in: House/apartment Has following equipment at home: None  OCCUPATION: Retired Runner, broadcasting/film/video.  PLOF: Independent  PATIENT GOALS: Bend and pick-up stuff off floor.  Put on socks.     OBJECTIVE:  Note: Objective measures were completed at Evaluation unless otherwise noted.  PATIENT SURVEYS:  Modified Oswestry 34.   POSTURE: rounded shoulders, forward head, decreased lumbar lordosis, posterior pelvic tilt, and flexed trunk   PALPATION: Tender to palpation over bilateral SIJ's.    LUMBAR ROM:   Active lumbar flexion a 50% deficit and extension is 20 degrees.   LOWER EXTREMITY MMT:    Bilateral knee and ankle strength is 5/5.  Right hip flexion limited to 3-/5 which appears due to pain.    LUMBAR SPECIAL TESTS:  Equal leg lengths.  Pain with a left SLR test.     GAIT: Slow and purposeful gait in some trunk flexion.    TREATMENT DATE: HMP and IFC at 80-150 Hz on 40% scan x 20 minutes to patient's bilateral LB.  Normal modality response following removal of modality.                                                                                                                                PATIENT EDUCATION:   Education details:  Person educated:  International aid/development worker:  Education comprehension:   HOME EXERCISE PROGRAM:   ASSESSMENT:  CLINICAL IMPRESSION: The patient presents to [facility] with chronic low back pain with radiation into bilateral hips.  She is tender to palpation over bilateral SIJ's. She has pain reproduction with a right SLR test.  She has difficulty elevating her right LE against gravity due to pain.  She has limitations of active lumbar flexion and extension.  Her Modified Oswestry score is 34.   Her Patient will benefit from skilled physical therapy intervention to address pain and deficits.  OBJECTIVE IMPAIRMENTS: Abnormal gait, decreased activity tolerance, decreased ROM, decreased strength, and pain.   ACTIVITY LIMITATIONS: carrying, lifting, bending, standing, and locomotion level  PARTICIPATION LIMITATIONS: meal prep, cleaning, laundry, shopping, and community activity  PERSONAL FACTORS: Time since onset of injury/illness/exacerbation and 1 comorbidity: bilateral total hip replacements  are also affecting patient's functional outcome.   REHAB POTENTIAL: Good  CLINICAL DECISION MAKING:  Evolving/moderate complexity  EVALUATION COMPLEXITY: Moderate   GOALS:  LONG TERM GOALS: Target date: 08/01/23.  Ind with a HEP.  Goal status: INITIAL  2.  Walk a community distance with pain not > 3/10.  Goal status: INITIAL  3.  Eliminate pain radiation into bilateral hips.  Goal status: INITIAL  4.  Perform ADL's with pain not > 3/10.  Goal status: INITIAL  PLAN:  PT FREQUENCY: 2x/week  PT DURATION: 6 weeks  PLANNED INTERVENTIONS: 97110-Therapeutic exercises, 97530- Therapeutic activity, [id number]- Neuromuscular re-education, 97535- Self Care, 28413- Manual therapy, 97014- Electrical stimulation (unattended), 97035- Ultrasound, Patient/Family education, Cryotherapy, and Moist heat.  PLAN FOR NEXT SESSION: Combo e'stim/US at 1.50 W/CM2, STW/M, Core exercise progression, spinal  protection techniques and body mechanics training.   Tremaine Earwood, [place], PT 06/27/2023, 12:54 PM

## 2023-07-01 ENCOUNTER — Ambulatory Visit: Payer: Medicare Other

## 2023-07-01 NOTE — Progress Notes (Signed)
 Letter sent.

## 2023-07-18 ENCOUNTER — Ambulatory Visit: Payer: Medicare Other | Admitting: Urology

## 2023-07-29 NOTE — Patient Instructions (Incomplete)
 Please continue using your CPAP regularly. While your insurance requires that you use CPAP at least 4 hours each night on 70% of the nights, I recommend, that you not skip any nights and use it throughout the night if you can. Getting used to CPAP and staying with the treatment long term does take time and patience and discipline. Untreated obstructive sleep apnea when it is moderate to severe can have an adverse impact on cardiovascular health and raise her risk for heart disease, arrhythmias, hypertension, congestive heart failure, stroke and diabetes. Untreated obstructive sleep apnea causes sleep disruption, nonrestorative sleep, and sleep deprivation. This can have an impact on your day to day functioning and cause daytime sleepiness and impairment of cognitive function, memory loss, mood disturbance, and problems focussing. Using CPAP regularly can improve these symptoms.  We will update supply orders, today. I will decrease ramp time.   Follow up in 1 year

## 2023-07-29 NOTE — Progress Notes (Unsigned)
 PATIENT: Julie Owen DOB: [birth year]  REASON FOR VISIT: follow up HISTORY FROM: patient  No chief complaint on file.   HISTORY OF PRESENT ILLNESS:  07/30/2023 ALL:  Julie Owen returns for follow up for OSA on CPAP.     01/03/2022 ALL: Julie Owen is doing well on CPAP. She is adjusting to new machine. She has had some trouble with [employer] sending incorrect supplies but is communicating with them. She lost her puddle, "Blue", and having more concerns of depression. She is not sleeping well. She requests a referral to psychology.     12/21/2020 ALL:  Julie Owen returns for follow up for OSA on CPAP. She continues to do very well. She uses CPAP nightly and greater than 4 hours each night. She denies concerns with CPAP machine or supplies. She does request that we order a new CPAP as her current machine is 78 years old.   Compliance report dated 11/20/2020-12/20/2020 shows that she used CPAP 30/30 nights for greater than 4 hours. Average usage was 8 hours. Residual AHI was 0.6/hr on set pressure of 8cmH20. No leak noted.   12/22/2019 ALL:  Julie Owen is a 78 y.o. female here today for follow up for OSA on CPAP. She is doing well. She is using CPAP every night. She has noted hair loss/breakage over the past 4-5 weeks. She is uncertain of cause. She is seeing dermatology next week for an evaluation. She is weaning prednisone for polymyalgia rheumatica. She is followed by PCP regularly and reports normal labs. No new medications or stressors. She is using a Dreamwear Nasal pillow with headgear. She has used this mask for about 5 years.   Compliance report dated 11/21/2019 through 12/20/2019 reveals that she is used CPAP 30 of the past 30 days for compliance of 100%.  She has used CPAP greater than 4 hours 30 of the last 30 days for compliance of 100%.  Average usage is 8 hours and 36 minutes.  Residual AHI is 2.0 on 8 cm of water and an EPR of 2.  There is no significant leak noted.  HISTORY: (copied from  my note on 12/21/2018)  Julie Owen is a 78 y.o. female here today for follow up of OSA on CPAP.  She is doing very well with CPAP at home.  She is using her machine nightly.  She does note that she is resting better and has more energy during the day.  She began CPAP therapy in October 2015.  She is eligible for a new machine in October of this year.  She returns for evaluation.   Compliance report dated 11/17/2018 through 12/16/2018 reveals that she is using CPAP every night for compliance of 100%.  She is using CPAP greater than 4 hours every night.  Average usage is 9 hours and 10 minutes.  AHI was 0.9 on 8 cm of water and an EPR of 2.  There was no significant leak.   HISTORY: (copied from Dr Dohmeier's note on 12/18/2017)   Revisit date is 18 December 2017, I have the pleasure of seeing Julie Owen. Julie Owen today for a yearly revisit for evaluation of CPAP compliance in the treatment of obstructive sleep apnea.  Mrs. Julie Owen is meanwhile 78 years old she continues to use her CPAP for his highest compliance, 100% for the last 30 days with an average use at time of 7 hours 51 minutes, CPAP is set at 8 cmH2O with 2 cm EPR and her residual AHI  is 0.8/h.  She is using an air sense 10 AutoSet the air leaks were mild, and she has not been woken by snoring, gasping for air or feeling air hungry. She is followed by [facility].    She endorsed today the fatigue severity score 27 and the Epworth sleepiness score at 5 out of 24 points, with a geriatric depression score endorsed at 11 out of 15. 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 He is demented and has CHF.  The stress made her sick, and her fathers cardiologist had refered him to hospice for respite care.   4-5 -2018. Julie Owen is a 78 y.o. female, who is seen here as a referral from Dr. Eloise Harman for an insomnia and snoring evaluation, ruling out apnea. Julie Owen describes  today a very troubling development. But she had no difficulties getting adjusted to CPAP use and for the first year was a very compliant user she then developed a tendency to mouth breathe- woke up with a parched dry "sticky " tongue, and now was placed in a full face mask with little improvement. Her sleep time has always been only 4-5 hours at night before CPAP was initiated but now her sleep time is even less. She endorsed the fatigue severity score today at 32 points, the Epworth sleepiness score at 8 and the geriatric depression score at a very high rate at 10 out of 15 Points. She uses a chin strip.  Her compliance has been excellent in spite of the troubles that 100% 5 hours 19 minutes each night ( but was 6-7 hours before) , 8 cm water pressure is currently used was 27 m EPR the residual AHI is 1.3. Her humidifier was not adjusted by the durable medical equipment company, she is currently followed by [facility].She had no medication adjustments, and we were not able to identify other factors that could have led to a dry mouth. She does have dry eyes now- to she reports that she has an air leak into the eyes which irritates the eyes. Systane gel at night, drops in daytime. I am looking for sicca symptoms and for a more comfortable mask. I will again place her on a nasasl pillow. Currently on an [employer].    Consultation, CD The patient is a retired Runner, broadcasting/film/video, and still has some engrained sleep habits, such as rising at 5 AM as she did for over 40 years. She is not sleepy but fatigued, she exclaimed. She had related the changes in her sleep pattern to her menopause. She developed palpitations from caffeine in peri menopause, felt panicked and anxious. She used" to sleep like a baby", but at age 68 she found herself in completed  Menopause. She could finally relate hot flushes and mood changes, tearfulness. Her sleep never recovered. She gained weight and begun snoring. Her sleep has  become less and less deep, restorative.  The patient reports she usually goes to bed around 9 PM watches TV in the bedroom. About one hour later she will go to sleep, but wakes up 2-3 times to go to the bathroom. If she turns over she will wake up, too. She has sometimes trouble to go back to sleep.  She always wakes up at 2.30 and she finds no further restful sleep after that, rises at 5.30, spontaneously, no alarm needed. She often has a dry mouth  when waking up. She does not report any nocturnal headaches usually. The overall nocturnal sleep time is between 4-5 hours only, she takes  One hour nap in the afternoon, about 2-3 PM, and on her sofa. Her husband sleeps not in the same room, she snores and has woken herself up form snoring. Her dog sleeps nearby, but never wakes her. She has owned a dog before that needed physically to be brought outside to urinate , 7-8 times at night. This dog died about 2 years ago.  She doesn't take any caffeine anymore. The sleep pattern may have changed than.  Family history - one cousin with OSA , non compliant  CPAP . Reviewed medication and alLergies,  HISTORY    06-10-14 Julie Owen is here today for her first visit after sleep study and CPAP titration. The patient underwent a polysomnography on 12-17-13 which revealed a mild apnea with an AHI of 10.5 and an RDI of 14.6 per hour but associated with oxygen desaturations. In the setting of snoring and periodic limb movements a CPAP titration was not performed the same night as the patient did not have enough sleep time at line. She returned on 02-18-14 for the CPAP titration,  her AHI was reduced to 1.4,  periodic limb movements were  rare - and she did best at 8 cm water pressure under which she slept 492 minutes.    PLAN : [facility] to exchange the machine, ASAP. RV in 3 month with NP for follow up.  The patient still has some nasal congestion which may contribute to her difficulties breathing at night at least on occasion.  I recommend a saltwater nasal spray to just literally flush the nostril. I have generated an electronic order to apnea to make sure that the patients machine will be exchanged for another one ASAP. We do not have to change the mask, Interface, Tubing or filter at this time.   Interval history from 08-15-14  Julie Owen has seen  benefit of a new CPAP machine. She had undergone an overnight pulse oximetry by using CPAP. But had downloaded data spoke for a very well controlled sleep apnea with an residual AHI file below 5 her fatigue severity was very high and she was excessively daytime sleepy as well.  It turned out that she remained hypoxemic and that a new machine was needed to control her sleep apnea as well as her oxygen levels. Her durable medical equipment company, namely [employer], exchanged her old not longer working machine for a new model which now is doing well. Her fatigue severity score today for further is 19 and her Epworth sleepiness score is 4 points. I was also able to get a new date download. The patient had been diagnosed with sleep apnea seems to do well on the current setting of 8 cm water. She continues to use an Eason nasal mask in medium size. Heated humidification his knee is used. The AHI on 06-10-14 was 0.4 , at  a 97% compliance.The patient reports problems with [facility], being billed several hundred USD a month for a rental machine- and having not filed for insurance coverage ? A broken machine needs to be replaced and is an insurance covered expense.    2017 , RV Julie Owen is now on her third CPAP machine and received her most recent in December 2016.  She is meanwhile 78 years of age she is using her CPAP with excellent compliance 100% of days and 100% of those days over  4 hours, with an average hourly use of 7 hours and 46 minutes and the set pressure of 8 cm water this 2 cm expiratory pressure relief.  Her residual AHI is 0.7, which speaks for an excellent resolution. The 95th  percentile airleak is close to 8 L/m which is a minimal air leak.   REVIEW OF SYSTEMS: Out of a complete 14 system review of symptoms, the patient complains only of the following symptoms, muscle aches, hair loss, snoring and all other reviewed systems are negative.  ESS 5/24  ALLERGIES: Allergies  Allergen Reactions   Clavulanic Acid Shortness Of Breath    Tolerates Amoxicillin for dental procedures   Diclofenac Shortness Of Breath   Aspirin Other (See Comments)    Abdominal pain   Benzocaine    Caffeine     Panic attacks   Codeine Swelling   Duloxetine Hcl     Other reaction(s): Ineffective for depression   Lidocaine-Epinephrine (Pf) Other (See Comments)   Oxycodone-Acetaminophen Swelling   Percocet [Oxycodone-Acetaminophen] Swelling   Pravastatin     myalgia   Procaine Hcl    Pseudoephedrine Hcl Er Other (See Comments)    Jittery, nervousness   Simvastatin     REACTION: hair loss   Venlafaxine     Other reaction(s): Ineffective for depression    HOME MEDICATIONS: Outpatient Medications Prior to Visit  Medication Sig Dispense Refill   Ascorbic Acid (VITAMIN C PO) Take 1 tablet by mouth daily.     calcium-vitamin D (OSCAL WITH D) 500-200 MG-UNIT tablet Take 1 tablet by mouth daily.     cycloSPORINE (RESTASIS) 0.05 % ophthalmic emulsion Place 1 drop into both eyes 2 (two) times daily.     ezetimibe (ZETIA) 10 MG tablet Take 10 mg by mouth daily.     furosemide (LASIX) 20 MG tablet Take 1 tablet (20 mg total) by mouth daily. For 3 days. 3 tablet 0   gabapentin (NEURONTIN) 300 MG capsule Take 300 mg by mouth 2 (two) times daily.     HYDROmorphone (DILAUDID) 2 MG tablet Take 1-2 tablets (2-4 mg total) by mouth every 6 (six) hours as needed for moderate pain or severe pain. (Patient not taking: Reported on 12/27/2022) 42 tablet 0   losartan (COZAAR) 50 MG tablet TAKE 1 TABLET BY MOUTH TWICE  DAILY 200 tablet 2   metFORMIN (GLUCOPHAGE-XR) 500 MG 24 hr tablet Take 500 mg by  mouth at bedtime.      methocarbamol (ROBAXIN) 500 MG tablet Take 1 tablet (500 mg total) by mouth every 6 (six) hours as needed for muscle spasms. (Patient not taking: Reported on 12/27/2022) 40 tablet 0   metroNIDAZOLE (METROGEL) 0.75 % gel Apply 1 application topically 2 (two) times daily.     NON FORMULARY Pt uses a cpap nightly     Polyethyl Glycol-Propyl Glycol (SYSTANE OP) Place 1 drop into both eyes as needed (dry eyes).     rosuvastatin (CRESTOR) 10 MG tablet Take 10 mg by mouth every Monday, Wednesday, and Friday.     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  Depression    Diabetes mellitus without complication (HCC)    Fatty liver    History of kidney stones    Hyperlipidemia    Hypertension    Neuromuscular disorder (HCC)    neuropathy   Obesity    Osteoarthritis of lumbar spine    Osteoarthritis of thumb    bilateral   Pneumonia    Prediabetes    Renal disorder    kidney stones   Sleep apnea    uses CPAP    PAST SURGICAL HISTORY: Past Surgical History:  Procedure Laterality Date   BREAST SURGERY  2008   Right breast   BUNIONECTOMY  1993   Right foot   CARPAL TUNNEL RELEASE Right    also had cyst removed   CATARACT EXTRACTION Bilateral    Lens implanted   CYSTOSCOPY W/ URETERAL STENT PLACEMENT Left 05/31/2015   Procedure: CYSTOSCOPY WITH STENT EXCHANGE;  Surgeon: Malen Gauze, MD;  Location: [facility];  Service: Urology;  Laterality: Left;   CYSTOSCOPY WITH RETROGRADE PYELOGRAM, URETEROSCOPY AND STENT PLACEMENT Left 05/19/2015   Procedure: CYSTOSCOPY WITH RETROGRADE PYELOGRAM, URETEROSCOPY AND STENT PLACEMENT;  Surgeon:  Malen Gauze, MD;  Location: [facility];  Service: Urology;  Laterality: Left;   CYSTOSCOPY WITH RETROGRADE PYELOGRAM, URETEROSCOPY AND STENT PLACEMENT Left 02/14/2018   Procedure: CYSTOSCOPY WITH RETROGRADE PYELOGRAM, diagnostic URETEROSCOPY;  Surgeon: Sebastian Ache, MD;  Location: [facility];  Service: Urology;  Laterality: Left;   CYSTOSCOPY/RETROGRADE/URETEROSCOPY/STONE EXTRACTION WITH BASKET Left 05/31/2015   Procedure: CYSTOSCOPY/RETROGRADE/STONE EXTRACTION WITH BASKET;  Surgeon: Malen Gauze, MD;  Location: [facility];  Service: Urology;  Laterality: Left;   eyelid surgery Bilateral    Blephroplasty   REDUCTION MAMMAPLASTY Bilateral 2003   STONE EXTRACTION WITH BASKET Left 05/19/2015   Procedure: STONE EXTRACTION WITH BASKET;  Surgeon: Malen Gauze, MD;  Location: [facility];  Service: Urology;  Laterality: Left;   TONSILLECTOMY     TOTAL HIP ARTHROPLASTY  08/2010   Right hip   TOTAL HIP ARTHROPLASTY Left 05/15/2022   Procedure: TOTAL HIP ARTHROPLASTY ANTERIOR APPROACH;  Surgeon: Ollen Gross, MD;  Location: [facility];  Service: Orthopedics;  Laterality: Left;    FAMILY HISTORY: Family History  Problem Relation Age of Onset   Hyperlipidemia Mother    Hypertension Mother    Heart disease Mother    Macular degeneration Father    Cancer Sister        colon   Hypertension Sister    Breast cancer Sister    Hypertension Other    Kidney disease Other    Stroke Other    Heart attack Other    Cancer Other    Obesity Other     SOCIAL HISTORY: Social History   Socioeconomic History   Marital status: Married    Spouse name: Julie Owen   Number of children: 0   Years of education: Master's   Highest education level: Not on file  Occupational History   Not on file  Tobacco Use   Smoking status: Former    Current packs/day: 0.00    Average packs/day: 0.5 packs/day for 10.0 years (5.0 ttl pk-yrs)    Types: Cigarettes    Start date: 12/11/1965    Quit date: 07/30/1975    Years  since quitting: 48.0   Smokeless tobacco: Never  Vaping Use   Vaping status: Never Used  Substance and Sexual Activity   Alcohol use: No    Alcohol/week: 0.0 standard drinks of alcohol   Drug use:  No   Sexual activity: Not Currently  Other Topics Concern   Not on file  Social History Narrative   Patient is married Julie Owen) and lives at home with her husband.   Patient is a retired Runner, broadcasting/film/video.   Patient has a Master's degree.   Patient is right-handed.   Patient does not drink any caffeine.   Social Drivers of Corporate investment banker Strain: Not on file  Food Insecurity: No Food Insecurity (05/15/2022)   Hunger Vital Sign    Worried About Running Out of Food in the Last Year: Never true    Ran Out of Food in the Last Year: Never true  Transportation Needs: No Transportation Needs (05/15/2022)   PRAPARE - Administrator, Civil Service (Medical): No    Lack of Transportation (Non-Medical): No  Physical Activity: Not on file  Stress: Not on file  Social Connections: Not on file  Intimate Partner Violence: Not At Risk (05/15/2022)   Humiliation, Afraid, Rape, and Kick questionnaire    Fear of Current or Ex-Partner: No    Emotionally Abused: No    Physically Abused: No    Sexually Abused: No      PHYSICAL EXAM  There were no vitals filed for this visit.   There is no height or weight on file to calculate BMI.  Generalized: Well developed, in no acute distress  Cardiology: normal rate and rhythm, no murmur noted Respiratory: clear to auscultation bilaterally  Neurological examination  Mentation: Alert oriented to time, place, history taking. Follows all commands speech and language fluent Cranial nerve II-XII: Pupils were equal round reactive to light. Extraocular movements were full, visual field were full on confrontational test. Facial sensation and strength were normal. Head turning and shoulder shrug  were normal and symmetric. Motor: The motor testing  reveals 5 over 5 strength of all 4 extremities. Good symmetric motor tone is noted throughout.  Gait and station: Gait is normal.    DIAGNOSTIC DATA (LABS, IMAGING, TESTING) - I reviewed patient records, labs, notes, testing and imaging myself where available.      No data to display           Lab Results  Component Value Date   WBC 10.8 (H) 05/17/2022   HGB 11.2 (L) 05/17/2022   HCT 34.7 (L) 05/17/2022   MCV 96.4 05/17/2022   PLT 180 05/17/2022      Component Value Date/Time   NA 137 05/16/2022 0323   K 4.9 05/16/2022 0323   CL 105 05/16/2022 0323   CO2 24 05/16/2022 0323   GLUCOSE 180 (H) 05/16/2022 0323   BUN 14 05/16/2022 0323   CREATININE 0.70 05/16/2022 0323   CALCIUM 8.6 (L) 05/16/2022 0323   PROT 6.9 05/11/2015 2332   ALBUMIN 4.1 05/11/2015 2332   AST 42 (H) 05/11/2015 2332   ALT 43 05/11/2015 2332   ALKPHOS 52 05/11/2015 2332   BILITOT 0.8 05/11/2015 2332   GFRNONAA >60 05/16/2022 0323   GFRAA >60 03/10/2019 1120   Lab Results  Component Value Date   CHOL 285 (H) 01/10/2012   HDL 54.60 01/10/2012   LDLDIRECT 206.0 01/10/2012   TRIG 198.0 (H) 01/10/2012   CHOLHDL 5 01/10/2012   Lab Results  Component Value Date   HGBA1C 6.9 (H) 05/08/2022   No results found for: "VITAMINB12" Lab Results  Component Value Date   TSH 1.503 03/10/2019     ASSESSMENT AND PLAN 78 y.o. year old female  has a  past medical history of Allergy, Anxiety, Atrial septal defect, Cancer (HCC), Depression, Diabetes mellitus without complication (HCC), Fatty liver, History of kidney stones, Hyperlipidemia, Hypertension, Neuromuscular disorder (HCC), Obesity, Osteoarthritis of lumbar spine, Osteoarthritis of thumb, Pneumonia, Prediabetes, Renal disorder, and Sleep apnea. here with    No diagnosis found.    Roe is doing great from a CPAP perspective. Compliance report reveals excellent compliance. She was encouraged to continue using CPAP nightly and greater than 4 hours each  night. I will update supply orders. She is having more difficulty sleeping at night and feels this is related to loss of her puddle, "Blue". Psychology referral placed. She verbalizes understanding and agreement with this plan.    No orders of the defined types were placed in this encounter.     No orders of the defined types were placed in this encounter.     Shawnie Dapper, FNP-C 07/29/2023, 8:48 AM [facility] [address], [address] [place], [place] [address] [phone]

## 2023-07-30 ENCOUNTER — Encounter: Payer: Self-pay | Admitting: Family Medicine

## 2023-07-30 ENCOUNTER — Ambulatory Visit: Payer: Medicare Other | Admitting: Family Medicine

## 2023-07-30 VITALS — BP 124/74 | HR 66 | Ht 65.0 in | Wt 197.0 lb

## 2023-07-30 DIAGNOSIS — G4733 Obstructive sleep apnea (adult) (pediatric): Secondary | ICD-10-CM | POA: Diagnosis not present

## 2023-10-13 ENCOUNTER — Other Ambulatory Visit (HOSPITAL_COMMUNITY): Payer: Self-pay | Admitting: Internal Medicine

## 2023-10-13 DIAGNOSIS — Z1231 Encounter for screening mammogram for malignant neoplasm of breast: Secondary | ICD-10-CM

## 2023-11-13 ENCOUNTER — Ambulatory Visit (HOSPITAL_COMMUNITY)

## 2023-11-13 ENCOUNTER — Encounter (HOSPITAL_COMMUNITY): Payer: Self-pay

## 2023-11-13 ENCOUNTER — Ambulatory Visit (HOSPITAL_COMMUNITY)
Admission: RE | Admit: 2023-11-13 | Discharge: 2023-11-13 | Disposition: A | Discharge status: Home / Self Care | Source: Ambulatory Visit | Attending: Internal Medicine | Admitting: Internal Medicine

## 2023-11-13 DIAGNOSIS — Z1231 Encounter for screening mammogram for malignant neoplasm of breast: Secondary | ICD-10-CM | POA: Diagnosis present

## 2024-01-07 ENCOUNTER — Ambulatory Visit: Admitting: Cardiology

## 2024-01-30 DIAGNOSIS — R3915 Urgency of urination: Secondary | ICD-10-CM | POA: Insufficient documentation

## 2024-02-02 ENCOUNTER — Encounter: Payer: Self-pay | Admitting: Urology

## 2024-02-02 ENCOUNTER — Ambulatory Visit: Admitting: Urology

## 2024-02-02 ENCOUNTER — Other Ambulatory Visit: Payer: Self-pay | Admitting: Cardiology

## 2024-02-02 VITALS — BP 135/78 | HR 67

## 2024-02-02 DIAGNOSIS — Z87440 Personal history of urinary (tract) infections: Secondary | ICD-10-CM

## 2024-02-02 DIAGNOSIS — R3915 Urgency of urination: Secondary | ICD-10-CM

## 2024-02-02 DIAGNOSIS — N3000 Acute cystitis without hematuria: Secondary | ICD-10-CM

## 2024-02-02 LAB — URINALYSIS, ROUTINE W REFLEX MICROSCOPIC
Bilirubin, UA: NEGATIVE
Ketones, UA: NEGATIVE
Nitrite, UA: NEGATIVE
Protein,UA: NEGATIVE
RBC, UA: NEGATIVE
Specific Gravity, UA: 1.025 (ref 1.005–1.030)
Urobilinogen, Ur: 0.2 mg/dL (ref 0.2–1.0)
pH, UA: 6 (ref 5.0–7.5)

## 2024-02-02 LAB — MICROSCOPIC EXAMINATION

## 2024-02-02 LAB — BLADDER SCAN AMB NON-IMAGING: Scan Result: 0

## 2024-02-02 MED ORDER — MIRABEGRON ER 25 MG PO TB24: 25.0000 mg | ORAL_TABLET | Freq: Every day | ORAL | 0 refills | Status: DC

## 2024-02-02 NOTE — Progress Notes (Signed)
 02/02/2024 2:07 PM   Julie Owen 03/30/1946 [id number]  Referring provider: Christi Jacob, NP [address] [place],  [place] [address]  Urinary incontinence   HPI: Ms Julie Owen is a 78yo here for followup for urinary urgency and frequency UTI. She has been treated for 3 UTIs in the past 6 months. She also notes worsening urinary urgency, frequency and urge incontinence. UA today is concerning for infection. She notes a stronger smell to her urine. She was having intermittent suprapubic pain last week which has since resolved.    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   Depression    Diabetes mellitus without complication (HCC)    Fatty liver    History of kidney stones    Hyperlipidemia    Hypertension    Neuromuscular disorder (HCC)    neuropathy   Obesity    Osteoarthritis of lumbar spine    Osteoarthritis of thumb    bilateral   Pneumonia    Prediabetes    Renal disorder    kidney stones   Sleep apnea    uses CPAP    Surgical History: Past Surgical History:  Procedure Laterality Date   BREAST SURGERY  2008   Right breast   BUNIONECTOMY  1993   Right foot   CARPAL TUNNEL RELEASE Right    also had cyst removed   CATARACT EXTRACTION Bilateral    Lens implanted   CYSTOSCOPY W/ URETERAL STENT PLACEMENT Left 05/31/2015   Procedure: CYSTOSCOPY WITH STENT EXCHANGE;  Surgeon: Belvie LITTIE Clara, MD;  Location: [facility];  Service: Urology;  Laterality: Left;   CYSTOSCOPY WITH RETROGRADE PYELOGRAM, URETEROSCOPY AND STENT PLACEMENT Left 05/19/2015   Procedure: CYSTOSCOPY WITH RETROGRADE PYELOGRAM, URETEROSCOPY AND STENT PLACEMENT;  Surgeon: Belvie LITTIE Clara, MD;  Location: [facility];  Service: Urology;  Laterality: Left;   CYSTOSCOPY WITH RETROGRADE PYELOGRAM, URETEROSCOPY AND STENT PLACEMENT Left 02/14/2018   Procedure: CYSTOSCOPY WITH RETROGRADE PYELOGRAM,  diagnostic URETEROSCOPY;  Surgeon: Alvaro Hummer, MD;  Location: [facility];  Service: Urology;  Laterality: Left;   CYSTOSCOPY/RETROGRADE/URETEROSCOPY/STONE EXTRACTION WITH BASKET Left 05/31/2015   Procedure: CYSTOSCOPY/RETROGRADE/STONE EXTRACTION WITH BASKET;  Surgeon: Belvie LITTIE Clara, MD;  Location: [facility];  Service: Urology;  Laterality: Left;   eyelid surgery Bilateral    Blephroplasty   REDUCTION MAMMAPLASTY Bilateral 2003   STONE EXTRACTION WITH BASKET Left 05/19/2015   Procedure: STONE EXTRACTION WITH BASKET;  Surgeon: Belvie LITTIE Clara, MD;  Location: [facility];  Service: Urology;  Laterality: Left;   TONSILLECTOMY     TOTAL HIP ARTHROPLASTY  08/2010   Right hip   TOTAL HIP ARTHROPLASTY Left 05/15/2022   Procedure: TOTAL HIP ARTHROPLASTY ANTERIOR APPROACH;  Surgeon: Melodi Lerner, MD;  Location: [facility];  Service: Orthopedics;  Laterality: Left;    Home Medications:  Allergies as of 02/02/2024       Reactions   Clavulanic Acid Shortness Of Breath   Tolerates Amoxicillin  for dental procedures   Diclofenac Shortness Of Breath   Aspirin Other (See Comments)   Abdominal pain   Benzocaine    Caffeine    Panic attacks   Codeine Swelling   Duloxetine Hcl    Other reaction(s): Ineffective for depression   Lidocaine -epinephrine (pf) Other (See Comments)   Oxycodone-acetaminophen  Swelling   Percocet [oxycodone-acetaminophen ] Swelling   Pravastatin     myalgia  Procaine Hcl    Pseudoephedrine Hcl Er Other (See Comments)   Jittery, nervousness   Simvastatin    REACTION: hair loss   Venlafaxine    Other reaction(s): Ineffective for depression        Medication List        Accurate as of February 02, 2024  2:07 PM. If you have any questions, ask your nurse or doctor.          ezetimibe  10 MG tablet Commonly known as: ZETIA  Take 10 mg by mouth daily.   gabapentin 300 MG capsule Commonly known as: NEURONTIN Take 300 mg by mouth 2 (two) times daily.   losartan  50  MG tablet Commonly known as: COZAAR  TAKE 1 TABLET BY MOUTH TWICE  DAILY   metFORMIN 500 MG 24 hr tablet Commonly known as: GLUCOPHAGE-XR Take 500 mg by mouth at bedtime.   metroNIDAZOLE 0.75 % gel Commonly known as: METROGEL Apply 1 application topically 2 (two) times daily.   NON FORMULARY Pt uses a cpap nightly   rosuvastatin  10 MG tablet Commonly known as: CRESTOR  Take 10 mg by mouth every Monday, Wednesday, and Friday.   SYSTANE OP Place 1 drop into both eyes as needed (dry eyes).        Allergies:  Allergies  Allergen Reactions   Clavulanic Acid Shortness Of Breath    Tolerates Amoxicillin  for dental procedures   Diclofenac Shortness Of Breath   Aspirin Other (See Comments)    Abdominal pain   Benzocaine    Caffeine     Panic attacks   Codeine Swelling   Duloxetine Hcl     Other reaction(s): Ineffective for depression   Lidocaine -Epinephrine (Pf) Other (See Comments)   Oxycodone-Acetaminophen  Swelling   Percocet [Oxycodone-Acetaminophen ] Swelling   Pravastatin      myalgia   Procaine Hcl    Pseudoephedrine Hcl Er Other (See Comments)    Jittery, nervousness   Simvastatin     REACTION: hair loss   Venlafaxine     Other reaction(s): Ineffective for depression    Family History: Family History  Problem Relation Age of Onset   Hyperlipidemia Mother    Hypertension Mother    Heart disease Mother    Macular degeneration Father    Cancer Sister        colon   Hypertension Sister    Breast cancer Sister    Hypertension Other    Kidney disease Other    Stroke Other    Heart attack Other    Cancer Other    Obesity Other    Sleep apnea Daughter     Social History:  reports that she quit smoking about 48 years ago. Her smoking use included cigarettes. She started smoking about 58 years ago. She has a 5 pack-year smoking history. She has never used smokeless tobacco. She reports current alcohol  use. She reports that she does not use drugs.  ROS: All  other review of systems were reviewed and are negative except what is noted above in HPI  Physical Exam: BP 135/78   Pulse 67   Constitutional:  Alert and oriented, No acute distress. HEENT: [employer] AT, moist mucus membranes.  Trachea midline, no masses. Cardiovascular: No clubbing, cyanosis, or edema. Respiratory: Normal respiratory effort, no increased work of breathing. GI: Abdomen is soft, nontender, nondistended, no abdominal masses GU: No CVA tenderness.  Lymph: No cervical or inguinal lymphadenopathy. Skin: No rashes, bruises or suspicious lesions. Neurologic: Grossly intact, no focal deficits, moving all 4  extremities. Psychiatric: Normal mood and affect.  Laboratory Data: Lab Results  Component Value Date   WBC 10.8 (H) 05/17/2022   HGB 11.2 (L) 05/17/2022   HCT 34.7 (L) 05/17/2022   MCV 96.4 05/17/2022   PLT 180 05/17/2022    Lab Results  Component Value Date   CREATININE 0.70 05/16/2022    No results found for: PSA  No results found for: TESTOSTERONE  Lab Results  Component Value Date   HGBA1C 6.9 (H) 05/08/2022    Urinalysis    Component Value Date/Time   COLORURINE YELLOW 05/18/2015 1050   APPEARANCEUR Cloudy (A) 04/24/2022 1103   LABSPEC 1.014 05/18/2015 1050   PHURINE 6.5 05/18/2015 1050   GLUCOSEU Negative 04/24/2022 1103   HGBUR LARGE (A) 05/18/2015 1050   BILIRUBINUR Negative 04/24/2022 1103   KETONESUR 15 (A) 05/18/2015 1050   PROTEINUR Trace 04/24/2022 1103   PROTEINUR NEGATIVE 05/18/2015 1050   UROBILINOGEN 0.2 04/09/2012 1800   NITRITE Positive (A) 04/24/2022 1103   NITRITE NEGATIVE 05/18/2015 1050   LEUKOCYTESUR 2+ (A) 04/24/2022 1103    Lab Results  Component Value Date   LABMICR See below: 04/24/2022   WBCUA >30 (A) 04/24/2022   LABEPIT 0-10 04/24/2022   BACTERIA Moderate (A) 04/24/2022    Pertinent Imaging:  Results for orders placed during the hospital encounter of 05/13/15  DG Abd 1 View  Narrative CLINICAL DATA:   Left flank pain  EXAM: ABDOMEN - 1 VIEW  COMPARISON:  04/13/2012  FINDINGS: There are no disproportionally dilated loops of bowel. There is no obvious free intraperitoneal gas. Right total hip arthroplasty is stable.  IMPRESSION: Nonobstructive bowel gas pattern.   Electronically Signed By: Rome Hall M.D. On: 05/13/2015 14:43  No results found for this or any previous visit.  No results found for this or any previous visit.  No results found for this or any previous visit.  Results for orders placed during the hospital encounter of 06/19/23  Ultrasound renal complete  Narrative CLINICAL DATA:  Follow-up nephrolithiasis  EXAM: RENAL / URINARY TRACT ULTRASOUND COMPLETE  COMPARISON:  Renal ultrasound 12/03/2022  FINDINGS: Right Kidney:  Renal measurements: 11.1 x 4.3 x 5.9 cm = volume: 146.5 mL. Echogenicity within normal limits. No mass or hydronephrosis visualized.  Left Kidney:  Renal measurements: 11.4 x 5.1 x 4.8 cm = volume: 147.5 mL. Echogenicity within normal limits. No mass or hydronephrosis visualized.  Bladder:  Appears normal for degree of bladder distention.  Other:  None.  IMPRESSION: No hydronephrosis.   Electronically Signed By: Bard Moats M.D. On: 06/19/2023 11:09  No results found for this or any previous visit.  No results found for this or any previous visit.  Results for orders placed during the hospital encounter of 05/11/15  CT RENAL STONE STUDY  Narrative CLINICAL DATA:  78 year old female with left-sided flank pain.  EXAM: CT ABDOMEN AND PELVIS WITHOUT CONTRAST  TECHNIQUE: Multidetector CT imaging of the abdomen and pelvis was performed following the standard protocol without IV contrast.  COMPARISON:  CT dated 04/09/2012 and lumbar spine MRI dated 09/05/2011  FINDINGS: Evaluation of this exam is limited in the absence of intravenous contrast. Minimal bibasilar linear atelectasis/scarring.  The visualized lung bases are otherwise clear. No intra-abdominal free air or free fluid.  Diffuse hepatic steatosis. An 8 mm ill-defined right hepatic hypodense lesion (series 2, image 34) is not well characterized on this noncontrast study but likely represents a cyst or hemangioma. CT with contrast or MRI is recommended  for further evaluation. The gallbladder, pancreas, spleen, adrenal glands, right kidney and right ureter, and urinary bladder appear unremarkable. There is a 4 mm left ureteropelvic junction stone with mild left hydronephrosis. Hysterectomy.  There is sigmoid diverticulosis without active inflammatory changes. There is a 1.4 cm low attenuatin/fatty lesion in the distal small bowel in the right lower quadrant (series 2, image 60) which may represent a small lipoma. There is no evidence of bowel obstruction. Normal appendix.  There is aortoiliac atherosclerotic disease. No portal venous gas identified. There is no adenopathy.  Small fat containing umbilical hernia. The abdominal wall soft tissues appear unremarkable. There is a right hip arthroplasty. There multilevel degenerative changes of the spine. No acute fractures.  IMPRESSION: A 4 mm left UPJ stone with mild left hydronephrosis.  Fatty liver. A subcentimeter right hepatic hypodense lesion, likely a hemangioma or cyst. MRI may provide better characterization.   Electronically Signed By: Vanetta Chou M.D. On: 05/11/2015 23:45   Assessment & Plan:    1. Urinary urgency (Primary) -we will trial mirabegron  25mg  daily.  -followup 3 months - Urinalysis, Routine w reflex microscopic - BLADDER SCAN AMB NON-IMAGING   No follow-ups on file.  Belvie Clara, MD  [facility] [place]

## 2024-02-02 NOTE — Progress Notes (Signed)
 Bladder Scan completed today.  Patient can void prior to the bladder scan. Bladder scan result: 0  Performed By: [facility] LPN

## 2024-02-02 NOTE — Patient Instructions (Signed)

## 2024-02-03 ENCOUNTER — Other Ambulatory Visit: Payer: Self-pay

## 2024-02-03 DIAGNOSIS — N200 Calculus of kidney: Secondary | ICD-10-CM

## 2024-02-03 NOTE — Progress Notes (Unsigned)
  Cardiology Office Note:   Date:  02/04/2024  ID:  Julie Owen, DOB 08-06-1945, MRN [id number] PCP: Yolande Toribio MATSU, MD  [facility] Providers Cardiologist:  Lynwood Schilling, MD {  History of Present Illness:   Julie Owen is a 78 y.o. female who presents for evaluation of an ASD.  This was found at [facility] in the past on TEE.  She was there was a suggestion about a closure but the patient refused a cardiac MRI because of a dye allergy.  She had a transthoracic echocardiogram in 2018 with a well-preserved ejection fraction.  She is there was a possible PFO or small ASD with very small left to right shunting.    At the last visit she had increased swelling and she received Lasix  for 3 days and echo demonstrated EF was well preserved with an small PFO.  RV function was normal.  Since I last saw her she has had no new cardiovascular complaints.  She is limited by back pain.  She walks with a cane. The patient denies any new symptoms such as chest discomfort, neck or arm discomfort. There has been no new shortness of breath, PND or orthopnea. There have been no reported palpitations, presyncope or syncope.   Of note interestingly she was supposed to have surgery 15 years ago or so and [facility] but she did not do this and she has done okay all these years.  ROS: As stated in the HPI and negative for all other systems.  Studies Reviewed:    EKG:   EKG Interpretation Date/Time:  Wednesday February 04 2024 14:43:35 EDT Ventricular Rate:  68 PR Interval:  178 QRS Duration:  74 QT Interval:  378 QTC Calculation: 401 R Axis:   28  Text Interpretation: Normal sinus rhythm Low voltage QRS Poor anterior R wave progression When compared with ECG of 27-Dec-2022 08:00, No significant change was found Confirmed by Schilling Lynwood ([id number]) on 02/04/2024 2:58:09 PM    Risk Assessment/Calculations:              Physical Exam:   VS:  BP 120/68   Pulse 68   Ht 5' 5 (1.651 m)   Wt 185 lb (83.9  kg)   BMI 30.79 kg/m    Wt Readings from Last 3 Encounters:  02/04/24 185 lb (83.9 kg)  07/30/23 197 lb (89.4 kg)  12/27/22 187 lb 12.8 oz (85.2 kg)     GEN: Well nourished, well developed in no acute distress NECK: No JVD; No carotid bruits CARDIAC: RRR, no murmurs, rubs, gallops RESPIRATORY:  Clear to auscultation without rales, wheezing or rhonchi  ABDOMEN: Soft, non-tender, non-distended EXTREMITIES:  No edema; No deformity   ASSESSMENT AND PLAN:   Atrial septal defect/low secundum ASD:    There were no significant findings other than PFO on the echo last year.  She had normal RV function.  She had no elevated pulmonary pressures.  She has no swelling or symptoms.  I will follow this clinically probably with a repeat echo in 1 year.   Essential HTN: Her blood pressure is well-controlled.  No change in therapy.    Hyperlipidemia:    Her LDL was down by her report though I do not have the most recent labs.     Sleep apnea: She uses CPAP.    Follow up with me in one year.   Signed, Lynwood Schilling, MD

## 2024-02-04 ENCOUNTER — Ambulatory Visit: Admitting: Cardiology

## 2024-02-04 ENCOUNTER — Encounter: Payer: Self-pay | Admitting: Cardiology

## 2024-02-04 VITALS — BP 120/68 | HR 68 | Ht 65.0 in | Wt 185.0 lb

## 2024-02-04 DIAGNOSIS — E785 Hyperlipidemia, unspecified: Secondary | ICD-10-CM

## 2024-02-04 DIAGNOSIS — Q2112 Patent foramen ovale: Secondary | ICD-10-CM

## 2024-02-04 DIAGNOSIS — M7989 Other specified soft tissue disorders: Secondary | ICD-10-CM | POA: Diagnosis not present

## 2024-02-04 DIAGNOSIS — I10 Essential (primary) hypertension: Secondary | ICD-10-CM | POA: Diagnosis not present

## 2024-02-04 DIAGNOSIS — Q211 Atrial septal defect, unspecified: Secondary | ICD-10-CM | POA: Diagnosis not present

## 2024-02-04 NOTE — Patient Instructions (Addendum)
 Medication Instructions:  Your physician recommends that you continue on your current medications as directed. Please refer to the Current Medication list given to you today.  Labwork: none  Testing/Procedures: Your physician has requested that you have an echocardiogram in September 2026. Echocardiography is a painless test that uses sound waves to create images of your heart. It provides your doctor with information about the size and shape of your heart and how well your heart's chambers and valves are working. This procedure takes approximately one hour. There are no restrictions for this procedure. Please do NOT wear cologne, perfume, aftershave, or lotions (deodorant is allowed). Please arrive 15 minutes prior to your appointment time.  Please note: We ask at that you not bring children with you during ultrasound (echo/ vascular) testing. Due to room size and safety concerns, children are not allowed in the ultrasound rooms during exams. Our front office staff cannot provide observation of children in our lobby area while testing is being conducted. An adult accompanying a patient to their appointment will only be allowed in the ultrasound room at the discretion of the ultrasound technician under special circumstances. We apologize for any inconvenience.  Follow-Up: Your physician recommends that you schedule a follow-up appointment in: 1 year. You will receive a reminder call in about 8-10 months reminding you to schedule your appointment. If you don't receive this call, please contact our office.  Any Other Special Instructions Will Be Listed Below (If Applicable).  If you need a refill on your cardiac medications before your next appointment, please call your pharmacy.

## 2024-02-05 LAB — URINE CULTURE

## 2024-02-06 ENCOUNTER — Ambulatory Visit: Payer: Self-pay

## 2024-02-06 MED ORDER — NITROFURANTOIN MONOHYD MACRO 100 MG PO CAPS: 100.0000 mg | ORAL_CAPSULE | Freq: Two times a day (BID) | ORAL | 0 refills | Status: AC

## 2024-02-06 NOTE — Telephone Encounter (Signed)
Patient called and made aware of positive urine culture and Macrobid sent to pharmacy per Dr. Alyson Ingles.

## 2024-03-17 ENCOUNTER — Ambulatory Visit: Admitting: Cardiology

## 2024-05-09 ENCOUNTER — Other Ambulatory Visit: Payer: Self-pay | Admitting: Cardiology

## 2024-05-31 ENCOUNTER — Ambulatory Visit (HOSPITAL_COMMUNITY)
Admission: RE | Admit: 2024-05-31 | Discharge: 2024-05-31 | Disposition: A | Discharge status: Home / Self Care | Source: Ambulatory Visit | Attending: Urology | Admitting: Urology

## 2024-05-31 DIAGNOSIS — N200 Calculus of kidney: Secondary | ICD-10-CM | POA: Insufficient documentation

## 2024-06-08 ENCOUNTER — Telehealth: Payer: Self-pay

## 2024-06-08 NOTE — Telephone Encounter (Signed)
 Patient had to cancel January's appointment due to weather.  Is not taking mrybetriq due to out of pocket cost. Patient wanting to discuss next step.  Please advise.  Call:  [phone]

## 2024-06-09 ENCOUNTER — Ambulatory Visit: Admitting: Urology

## 2024-06-09 ENCOUNTER — Telehealth: Payer: Self-pay

## 2024-06-09 DIAGNOSIS — R3915 Urgency of urination: Secondary | ICD-10-CM

## 2024-06-09 NOTE — Telephone Encounter (Signed)
 Dysuria  Patient called with c/o dysuria 7 weeks days.  Pain: aching  Severity:7/10  Associated Signs and Symptoms:  Fever: noTemp.no Chills: no Hematuria: no Urgency: yes Frequency: yes Hesitancy:yes Incontinence: no Nausea: no Vomiting: no  Requesting a urine drop-off.  Call:   [phone]

## 2024-06-11 ENCOUNTER — Other Ambulatory Visit: Payer: Self-pay

## 2024-06-11 DIAGNOSIS — N3000 Acute cystitis without hematuria: Secondary | ICD-10-CM

## 2024-06-11 NOTE — Telephone Encounter (Signed)
" °  Urologic History:  Any Recent Urologic Surgeries or Procedures:no Recurrent UTI's:yes Cystitis: no  Prostatitis:no Kidney or Bladder Stones: no Plan: Walk-in Clinic: no Appointment w/Physician: [no Lab visit scheduled for urine drop off: No Advice given:  Do you take on daily medications for UTI suppression No   "

## 2024-06-16 ENCOUNTER — Other Ambulatory Visit

## 2024-06-18 ENCOUNTER — Ambulatory Visit

## 2024-06-21 ENCOUNTER — Ambulatory Visit: Admitting: Urology

## 2024-08-02 ENCOUNTER — Ambulatory Visit: Admitting: Family Medicine

## 2024-09-03 ENCOUNTER — Ambulatory Visit: Admitting: Urology

## 2025-02-03 ENCOUNTER — Ambulatory Visit
